# Patient Record
Sex: Female | Born: 1985 | Race: Black or African American | Hispanic: No | Marital: Single | Smoking: Current every day smoker
Health system: Southern US, Community
[De-identification: ages and names within clinical notes are randomized; demographics above are authoritative.]

## PROBLEM LIST (undated history)

## (undated) DIAGNOSIS — N83209 Unspecified ovarian cyst, unspecified side: Secondary | ICD-10-CM

## (undated) DIAGNOSIS — K648 Other hemorrhoids: Secondary | ICD-10-CM

## (undated) DIAGNOSIS — F419 Anxiety disorder, unspecified: Secondary | ICD-10-CM

## (undated) DIAGNOSIS — A599 Trichomoniasis, unspecified: Secondary | ICD-10-CM

## (undated) DIAGNOSIS — D649 Anemia, unspecified: Secondary | ICD-10-CM

## (undated) DIAGNOSIS — F329 Major depressive disorder, single episode, unspecified: Secondary | ICD-10-CM

## (undated) DIAGNOSIS — O00109 Unspecified tubal pregnancy without intrauterine pregnancy: Secondary | ICD-10-CM

## (undated) DIAGNOSIS — K589 Irritable bowel syndrome without diarrhea: Secondary | ICD-10-CM

## (undated) DIAGNOSIS — T7840XA Allergy, unspecified, initial encounter: Secondary | ICD-10-CM

## (undated) DIAGNOSIS — R112 Nausea with vomiting, unspecified: Secondary | ICD-10-CM

## (undated) DIAGNOSIS — Z9889 Other specified postprocedural states: Secondary | ICD-10-CM

## (undated) DIAGNOSIS — A048 Other specified bacterial intestinal infections: Secondary | ICD-10-CM

## (undated) DIAGNOSIS — K219 Gastro-esophageal reflux disease without esophagitis: Secondary | ICD-10-CM

## (undated) DIAGNOSIS — F32A Depression, unspecified: Secondary | ICD-10-CM

## (undated) HISTORY — DX: Other specified bacterial intestinal infections: A04.8

## (undated) HISTORY — DX: Trichomoniasis, unspecified: A59.9

## (undated) HISTORY — PX: TUBAL LIGATION: SHX77

## (undated) HISTORY — DX: Depression, unspecified: F32.A

## (undated) HISTORY — DX: Major depressive disorder, single episode, unspecified: F32.9

## (undated) HISTORY — DX: Allergy, unspecified, initial encounter: T78.40XA

## (undated) HISTORY — DX: Unspecified ovarian cyst, unspecified side: N83.209

## (undated) HISTORY — DX: Gastro-esophageal reflux disease without esophagitis: K21.9

## (undated) HISTORY — DX: Anemia, unspecified: D64.9

## (undated) HISTORY — DX: Irritable bowel syndrome, unspecified: K58.9

## (undated) HISTORY — DX: Other hemorrhoids: K64.8

## (undated) HISTORY — DX: Anxiety disorder, unspecified: F41.9

---

## 2008-05-27 ENCOUNTER — Inpatient Hospital Stay (HOSPITAL_COMMUNITY): Admission: AD | Admit: 2008-05-27 | Discharge: 2008-05-27 | Payer: Self-pay | Admitting: Gynecology

## 2008-06-04 ENCOUNTER — Ambulatory Visit: Payer: Self-pay | Admitting: Advanced Practice Midwife

## 2008-06-04 ENCOUNTER — Inpatient Hospital Stay (HOSPITAL_COMMUNITY): Admission: AD | Admit: 2008-06-04 | Discharge: 2008-06-04 | Payer: Self-pay | Admitting: Obstetrics & Gynecology

## 2008-06-05 ENCOUNTER — Ambulatory Visit: Payer: Self-pay | Admitting: Obstetrics & Gynecology

## 2008-06-10 ENCOUNTER — Ambulatory Visit (HOSPITAL_COMMUNITY): Admission: RE | Admit: 2008-06-10 | Discharge: 2008-06-10 | Payer: Self-pay | Admitting: Obstetrics and Gynecology

## 2008-06-12 ENCOUNTER — Ambulatory Visit: Payer: Self-pay | Admitting: Obstetrics & Gynecology

## 2008-06-26 ENCOUNTER — Ambulatory Visit: Payer: Self-pay | Admitting: Obstetrics & Gynecology

## 2008-07-03 ENCOUNTER — Ambulatory Visit: Payer: Self-pay | Admitting: Obstetrics & Gynecology

## 2008-07-05 ENCOUNTER — Inpatient Hospital Stay (HOSPITAL_COMMUNITY): Admission: AD | Admit: 2008-07-05 | Discharge: 2008-07-05 | Payer: Self-pay | Admitting: Obstetrics & Gynecology

## 2008-07-05 ENCOUNTER — Ambulatory Visit: Payer: Self-pay | Admitting: Physician Assistant

## 2008-07-06 ENCOUNTER — Inpatient Hospital Stay (HOSPITAL_COMMUNITY): Admission: AD | Admit: 2008-07-06 | Discharge: 2008-07-06 | Payer: Self-pay | Admitting: Obstetrics & Gynecology

## 2008-07-06 ENCOUNTER — Ambulatory Visit: Payer: Self-pay | Admitting: Advanced Practice Midwife

## 2008-07-10 ENCOUNTER — Ambulatory Visit: Payer: Self-pay | Admitting: Obstetrics & Gynecology

## 2008-07-11 ENCOUNTER — Ambulatory Visit: Payer: Self-pay | Admitting: Family Medicine

## 2008-07-11 ENCOUNTER — Inpatient Hospital Stay (HOSPITAL_COMMUNITY): Admission: AD | Admit: 2008-07-11 | Discharge: 2008-07-11 | Payer: Self-pay | Admitting: Family Medicine

## 2008-07-13 ENCOUNTER — Inpatient Hospital Stay (HOSPITAL_COMMUNITY): Admission: AD | Admit: 2008-07-13 | Discharge: 2008-07-15 | Payer: Self-pay | Admitting: Obstetrics & Gynecology

## 2008-07-13 ENCOUNTER — Ambulatory Visit: Payer: Self-pay | Admitting: Gynecology

## 2008-07-23 ENCOUNTER — Inpatient Hospital Stay (HOSPITAL_COMMUNITY): Admission: AD | Admit: 2008-07-23 | Discharge: 2008-07-25 | Payer: Self-pay | Admitting: Obstetrics & Gynecology

## 2008-07-23 ENCOUNTER — Ambulatory Visit: Payer: Self-pay | Admitting: Family Medicine

## 2011-03-02 NOTE — Discharge Summary (Signed)
NAME:  Jennifer Combs, Jennifer Combs             ACCOUNT NO.:  [id number]   MEDICAL RECORD NO.:  [id number]          PATIENT TYPE:  INP   LOCATION:  9317                          FACILITY:  [facility]   PHYSICIAN:  Lesly Dukes, M.D. DATE OF BIRTH:  10-29-1985   DATE OF ADMISSION:  07/23/2008  DATE OF DISCHARGE:  07/25/2008                               DISCHARGE SUMMARY   REASON FOR ADMISSION:  [name] is a 25 year old female who  presented to MAU 10 days postpartum with temperature of 103, diffuse  myalgias, and lower abdominal pain.  She reports fevers and chills and  moderate amount of lochia that was not foul smelling.  She denied having  cough, running nose, or sore throat.  Her abdomen was markedly tender in  the MAU and she was diagnosed with endometritis and admitted for IV  antibiotics.   HOSPITAL COURSE:  [name] was admitted, started on triple antibiotic  therapy with ampicillin, clindamycin, and gentamicin.  Her initial white  count was 9.1 with a neutrophil of 83%.  The patient had a temperature  of 101.1 at 9:00 p.m. on the day of admission, however, since then she  has remained and afebrile.  Her abdominal pain is markedly decreased at  time of discharge.  Of note, she did complaint some right breast pain  during her hospital stay.  However, there was no fluctuance or erythema  noted.  Additionally after pumping a large bottle, the breasts felt  significantly better.  At the time of discharge, the patient has minimal  abdominal tenderness.  Her temperature is normal and she is feeling  markedly better.  The patient will be discharged on Augmentin twice  daily for another 12 days to complete 14-day course of antibiotics.   FINAL DIAGNOSIS:  Endometritis.   CONDITION AT DISCHARGE:  Stable.   INSTRUCTIONS FOR THE PATIENT:  The patient was instructed to take  Augmentin 1 pill twice daily for 12 more days.  The patient was given no  restrictions for activity and diet.   The patient was instructed to  [facility] up with [facility] for her 6-week postpartum check or  return to the MAU for worsening symptoms before that.  However, the  patient's questions were answered and the patient was voice  understanding of the above instructions.      Odie Sera, DO      Lesly Dukes, M.D.  Electronically Signed    [facility]  D:  07/25/2008  T:  07/25/2008  Job:  [id number]

## 2011-04-08 DIAGNOSIS — Z8659 Personal history of other mental and behavioral disorders: Secondary | ICD-10-CM | POA: Insufficient documentation

## 2011-04-08 DIAGNOSIS — O09899 Supervision of other high risk pregnancies, unspecified trimester: Secondary | ICD-10-CM | POA: Insufficient documentation

## 2011-04-08 DIAGNOSIS — A6000 Herpesviral infection of urogenital system, unspecified: Secondary | ICD-10-CM | POA: Insufficient documentation

## 2011-04-08 DIAGNOSIS — O99891 Other specified diseases and conditions complicating pregnancy: Secondary | ICD-10-CM | POA: Insufficient documentation

## 2011-07-16 LAB — GC/CHLAMYDIA PROBE AMP, GENITAL
Chlamydia, DNA Probe: NEGATIVE
GC Probe Amp, Genital: NEGATIVE

## 2011-07-16 LAB — WET PREP, GENITAL
Clue Cells Wet Prep HPF POC: NONE SEEN
Trich, Wet Prep: NONE SEEN
Yeast Wet Prep HPF POC: NONE SEEN

## 2011-07-19 LAB — CBC
HCT: 26.8 — ABNORMAL LOW
HCT: 34 — ABNORMAL LOW
Hemoglobin: 11.3 — ABNORMAL LOW
Hemoglobin: 9.1 — ABNORMAL LOW
MCHC: 34.1
MCV: 99
Platelets: 268
RBC: 2.71 — ABNORMAL LOW
RBC: 3.44 — ABNORMAL LOW
RDW: 13.3
WBC: 9.5

## 2011-07-19 LAB — POCT URINALYSIS DIP (DEVICE)
Bilirubin Urine: NEGATIVE
Glucose, UA: NEGATIVE
Glucose, UA: NEGATIVE
Ketones, ur: NEGATIVE
Operator id: 200901
Operator id: 297281
Protein, ur: NEGATIVE
Specific Gravity, Urine: 1.02
Specific Gravity, Urine: <=1.005

## 2011-07-20 LAB — URINE CULTURE

## 2011-07-20 LAB — CULTURE, BLOOD (ROUTINE X 2): Culture: NO GROWTH

## 2011-07-20 LAB — DIFFERENTIAL
Basophils Absolute: 0
Lymphocytes Relative: 13
Lymphs Abs: 1.1
Neutro Abs: 7.6

## 2011-07-20 LAB — CBC
Platelets: 305
RDW: 13.2
WBC: 9.1

## 2011-07-20 LAB — URINALYSIS, ROUTINE W REFLEX MICROSCOPIC
Glucose, UA: NEGATIVE
Protein, ur: NEGATIVE
Specific Gravity, Urine: 1.01
Urobilinogen, UA: 1

## 2011-07-20 LAB — BASIC METABOLIC PANEL
CO2: 22
Chloride: 103
GFR calc Af Amer: >60
Potassium: 2.8 — ABNORMAL LOW
Sodium: 137

## 2011-07-20 LAB — URINE MICROSCOPIC-ADD ON

## 2011-07-21 LAB — POCT URINALYSIS DIP (DEVICE)
Bilirubin Urine: NEGATIVE
Glucose, UA: NEGATIVE
Nitrite: NEGATIVE
Operator id: 297281
pH: 6

## 2012-03-29 ENCOUNTER — Encounter (HOSPITAL_COMMUNITY): Payer: Self-pay

## 2012-07-06 DIAGNOSIS — Z3490 Encounter for supervision of normal pregnancy, unspecified, unspecified trimester: Secondary | ICD-10-CM | POA: Insufficient documentation

## 2012-07-06 DIAGNOSIS — O09899 Supervision of other high risk pregnancies, unspecified trimester: Secondary | ICD-10-CM | POA: Insufficient documentation

## 2012-07-24 DIAGNOSIS — Z3009 Encounter for other general counseling and advice on contraception: Secondary | ICD-10-CM | POA: Insufficient documentation

## 2012-10-02 DIAGNOSIS — O365990 Maternal care for other known or suspected poor fetal growth, unspecified trimester, not applicable or unspecified: Secondary | ICD-10-CM | POA: Insufficient documentation

## 2012-10-05 DIAGNOSIS — Z7185 Encounter for immunization safety counseling: Secondary | ICD-10-CM | POA: Insufficient documentation

## 2015-02-01 ENCOUNTER — Emergency Department (HOSPITAL_COMMUNITY)
Admission: EM | Admit: 2015-02-01 | Discharge: 2015-02-01 | Disposition: A | Discharge status: Home / Self Care | Payer: Medicaid Other | Attending: Emergency Medicine | Admitting: Emergency Medicine

## 2015-02-01 ENCOUNTER — Encounter (HOSPITAL_COMMUNITY): Payer: Self-pay

## 2015-02-01 DIAGNOSIS — R110 Nausea: Secondary | ICD-10-CM | POA: Diagnosis present

## 2015-02-01 DIAGNOSIS — Z720 Tobacco use: Secondary | ICD-10-CM | POA: Diagnosis not present

## 2015-02-01 HISTORY — DX: Unspecified tubal pregnancy without intrauterine pregnancy: O00.109

## 2015-02-01 LAB — I-STAT BETA HCG BLOOD, ED (MC, WL, AP ONLY)

## 2015-02-01 MED ORDER — ONDANSETRON 4 MG PO TBDP
8.0000 mg | ORAL_TABLET | Freq: Once | ORAL | Status: AC
Start: 2015-02-01 — End: 2015-02-01
  Administered 2015-02-01: 8 mg via ORAL
  Filled 2015-02-01: qty 2

## 2015-02-01 MED ORDER — ONDANSETRON 8 MG PO TBDP: 8.0000 mg | ORAL_TABLET | Freq: Three times a day (TID) | ORAL | Status: DC | PRN

## 2015-02-01 NOTE — ED Provider Notes (Signed)
CSN: [id number]     Arrival date & time 02/01/15  0740 History   First MD Initiated Contact with Patient 02/01/15 0745     Chief Complaint  Patient presents with  . Nausea     (Consider location/radiation/quality/duration/timing/severity/associated sxs/prior Treatment) HPI 29 year old female G5 P4 A1 presents today with last menstrual period at the beginning of last month and nausea. She states that her phone has had problems and she is not sure exactly what her menstrual period was. She states that it was at the beginning of last month. She is not sure if her menses is late. She's been pregnant multiple times in the past. She has had nausea for the past 2 weeks in the morning. She had some abdominal cramping yesterday but is not having any current abdominal pain. She denies any vaginal discharge. She has been eating and drinking and has had some episode of dry heaving 2 during the past 2 weeks. She has not lost weight. She took Zofran at home 2 with some relief. She denies any weight loss, decreased urine output, increased frequency of urination, or diarrhea or fever. Past Medical History  Diagnosis Date  . Ectopic pregnancy, tubal    No past surgical history on file. No family history on file. History  Substance Use Topics  . Smoking status: Light Tobacco Smoker  . Smokeless tobacco: Not on file  . Alcohol Use: Yes   OB History    No data available     Review of Systems  All other systems reviewed and are negative.     Allergies  Citrus  Home Medications   Prior to Admission medications   Medication Sig Start Date End Date Taking? Authorizing Provider  ondansetron (ZOFRAN-ODT) 4 MG disintegrating tablet Take 4 mg by mouth every 8 (eight) hours as needed for nausea or vomiting.   Yes Historical Provider, MD   BP 114/62 mmHg  Pulse 68  Temp(Src) 98.5 F (36.9 C) (Oral)  Resp 18  Ht  (1.499 m)  Wt 145 lb (65.772 kg)  BMI 29.27 kg/m2  SpO2 100% Physical Exam   Constitutional: She is oriented to person, place, and time. She appears well-developed and well-nourished.  HENT:  Head: Normocephalic and atraumatic.  Right Ear: External ear normal.  Left Ear: External ear normal.  Nose: Nose normal.  Mouth/Throat: Oropharynx is clear and moist.  Eyes: Conjunctivae and EOM are normal. Pupils are equal, round, and reactive to light.  Neck: Normal range of motion.  Cardiovascular: Normal rate.   Pulmonary/Chest: Effort normal and breath sounds normal.  Abdominal: Soft. Bowel sounds are normal. She exhibits no distension and no mass. There is no tenderness. There is no rebound and no guarding.  Musculoskeletal: Normal range of motion.  Neurological: She is alert and oriented to person, place, and time.  Skin: Skin is warm and dry.  Psychiatric: She has a normal mood and affect.  Nursing note and vitals reviewed.   ED Course  Procedures (including critical care time) Labs Review Labs Reviewed  I-STAT BETA HCG BLOOD, ED ([facility], [facility], [facility] ONLY)    Imaging Review No results found.   EKG Interpretation None      MDM   Final diagnoses:  Nausea    Patient here with nausea and concern for pregnancy. I-STAT quantitative point of care. HCG is negative for protein. She is given Zofran and has not vomited here in department. She's given a prescription for Zofran and advised regarding follow-up and return precautions  and voices understanding.    Margarita Grizzleanielle Maye Parkinson, MD 02/01/15 [id number]

## 2015-02-01 NOTE — ED Notes (Signed)
Pt given a warm blanket 

## 2015-02-01 NOTE — ED Notes (Signed)
Pt here for nausea x 2 weeks, no relief with zofran, sts similar issues with pregnancy and unsure if pregnant.

## 2015-02-01 NOTE — Discharge Instructions (Signed)

## 2015-02-05 ENCOUNTER — Emergency Department (HOSPITAL_COMMUNITY)
Admission: EM | Admit: 2015-02-05 | Discharge: 2015-02-05 | Disposition: A | Discharge status: Home / Self Care | Payer: Medicaid Other | Attending: Emergency Medicine | Admitting: Emergency Medicine

## 2015-02-05 ENCOUNTER — Encounter (HOSPITAL_COMMUNITY): Payer: Self-pay

## 2015-02-05 DIAGNOSIS — Z3202 Encounter for pregnancy test, result negative: Secondary | ICD-10-CM | POA: Insufficient documentation

## 2015-02-05 DIAGNOSIS — R197 Diarrhea, unspecified: Secondary | ICD-10-CM | POA: Insufficient documentation

## 2015-02-05 DIAGNOSIS — K30 Functional dyspepsia: Secondary | ICD-10-CM

## 2015-02-05 DIAGNOSIS — R112 Nausea with vomiting, unspecified: Secondary | ICD-10-CM | POA: Insufficient documentation

## 2015-02-05 DIAGNOSIS — Z9851 Tubal ligation status: Secondary | ICD-10-CM | POA: Insufficient documentation

## 2015-02-05 DIAGNOSIS — R1084 Generalized abdominal pain: Secondary | ICD-10-CM | POA: Diagnosis not present

## 2015-02-05 DIAGNOSIS — R109 Unspecified abdominal pain: Secondary | ICD-10-CM | POA: Diagnosis present

## 2015-02-05 DIAGNOSIS — Z720 Tobacco use: Secondary | ICD-10-CM | POA: Diagnosis not present

## 2015-02-05 LAB — URINALYSIS, ROUTINE W REFLEX MICROSCOPIC
BILIRUBIN URINE: NEGATIVE
GLUCOSE, UA: NEGATIVE mg/dL
Ketones, ur: NEGATIVE mg/dL
Leukocytes, UA: NEGATIVE
Nitrite: NEGATIVE
PROTEIN: NEGATIVE mg/dL
SPECIFIC GRAVITY, URINE: 1.017 (ref 1.005–1.030)
UROBILINOGEN UA: 1 mg/dL (ref 0.0–1.0)
pH: 8 (ref 5.0–8.0)

## 2015-02-05 LAB — COMPREHENSIVE METABOLIC PANEL
ALBUMIN: 3.7 g/dL (ref 3.5–5.2)
ALK PHOS: 72 U/L (ref 39–117)
ALT: 10 U/L (ref 0–35)
ANION GAP: 6 (ref 5–15)
AST: 19 U/L (ref 0–37)
BILIRUBIN TOTAL: 0.4 mg/dL (ref 0.3–1.2)
BUN: 11 mg/dL (ref 6–23)
CALCIUM: 8.9 mg/dL (ref 8.4–10.5)
CHLORIDE: 106 mmol/L (ref 96–112)
CO2: 27 mmol/L (ref 19–32)
CREATININE: 0.75 mg/dL (ref 0.50–1.10)
GFR calc non Af Amer: >90 mL/min (ref >90)
Glucose, Bld: 89 mg/dL (ref 70–99)
Potassium: 3.8 mmol/L (ref 3.5–5.1)
Sodium: 139 mmol/L (ref 135–145)
Total Protein: 6.6 g/dL (ref 6.0–8.3)

## 2015-02-05 LAB — CBC WITH DIFFERENTIAL/PLATELET
Basophils Absolute: 0 10*3/uL (ref 0.0–0.1)
Basophils Relative: 0 % (ref 0–1)
EOS ABS: 0.1 10*3/uL (ref 0.0–0.7)
Eosinophils Relative: 2 % (ref 0–5)
HCT: 30.3 % — ABNORMAL LOW (ref 36.0–46.0)
Hemoglobin: 10.3 g/dL — ABNORMAL LOW (ref 12.0–15.0)
LYMPHS ABS: 1.6 10*3/uL (ref 0.7–4.0)
Lymphocytes Relative: 34 % (ref 12–46)
MCH: 32.7 pg (ref 26.0–34.0)
MCHC: 34 g/dL (ref 30.0–36.0)
MCV: 96.2 fL (ref 78.0–100.0)
Monocytes Absolute: 0.2 10*3/uL (ref 0.1–1.0)
Monocytes Relative: 5 % (ref 3–12)
NEUTROS PCT: 59 % (ref 43–77)
Neutro Abs: 2.7 10*3/uL (ref 1.7–7.7)
PLATELETS: 255 10*3/uL (ref 150–400)
RBC: 3.15 MIL/uL — AB (ref 3.87–5.11)
RDW: 12.4 % (ref 11.5–15.5)
WBC: 4.6 10*3/uL (ref 4.0–10.5)

## 2015-02-05 LAB — URINE MICROSCOPIC-ADD ON

## 2015-02-05 LAB — POC URINE PREG, ED: Preg Test, Ur: NEGATIVE

## 2015-02-05 LAB — LIPASE, BLOOD: LIPASE: 24 U/L (ref 11–59)

## 2015-02-05 LAB — PREGNANCY, URINE: PREG TEST UR: NEGATIVE

## 2015-02-05 MED ORDER — PANTOPRAZOLE SODIUM 20 MG PO TBEC: 20.0000 mg | DELAYED_RELEASE_TABLET | Freq: Every day | ORAL | Status: DC

## 2015-02-05 MED ORDER — GI COCKTAIL ~~LOC~~
30.0000 mL | Freq: Once | ORAL | Status: AC
  Administered 2015-02-05: 30 mL via ORAL
  Filled 2015-02-05: qty 30

## 2015-02-05 NOTE — ED Provider Notes (Signed)
CSN: [id number]     Arrival date & time 02/05/15  1231 History  This chart is scribed for non-physician practitioner, Dierdre Forth, PA-C, working with Doug Sou, MD by Abel Presto, ED Scribe.  This patient was seen in room TR01C/TR01C and the patient's care was started 2:02 PM.      Chief Complaint  Patient presents with  . Abdominal Pain      HPI HPI Comments: Jennifer Combs is a 29 y.o. female who presents to the Emergency Department complaining of intermittent generalized burning abdominal pain with first onset 2 years ago worsening in last 4 days. Pt states abdominal pain started after tubal ligation 2 years ago. Pt reports she has changed her diet to minimized frequency of episodes. Pt likens pain to sensation felt after eating spicy foods, but reports she has not been eating spicy foods.She also states it feels as though something is moving in her abdomen however she is not pregnant. (Patient was evaluated on 02/01/2015 with a negative blood hCG and just finished her menses.) Pt notes associated diarrhea, nausea, and vomiting once with onset today. Pt states she has defecated 5 times today. She reports these symptoms are typical for episodes of her abdominal pain.  She denies melena, hematochezia, ileus or bloody emesis.  Pt has not had any EtOH in a week. She denies NSAID usage. Pt's LNMP just ended. Pt denies dysuria, hematuria, frequency, urgency, fever, chills, hematochezia, and hematemesis.   Past Medical History  Diagnosis Date  . Ectopic pregnancy, tubal    History reviewed. No pertinent past surgical history. History reviewed. No pertinent family history. History  Substance Use Topics  . Smoking status: Light Tobacco Smoker  . Smokeless tobacco: Not on file  . Alcohol Use: Yes   OB History    No data available     Review of Systems  Constitutional: Negative for fever, chills, diaphoresis, appetite change, fatigue and unexpected weight change.  HENT:  Negative for mouth sores.   Eyes: Negative for visual disturbance.  Respiratory: Negative for cough, chest tightness, shortness of breath and wheezing.   Cardiovascular: Negative for chest pain.  Gastrointestinal: Positive for nausea, vomiting, abdominal pain and diarrhea. Negative for constipation and blood in stool.  Endocrine: Negative for polydipsia, polyphagia and polyuria.  Genitourinary: Negative for dysuria, urgency, frequency and hematuria.  Musculoskeletal: Negative for back pain and neck stiffness.  Skin: Negative for rash.  Allergic/Immunologic: Negative for immunocompromised state.  Neurological: Negative for syncope, light-headedness and headaches.  Hematological: Does not bruise/bleed easily.  Psychiatric/Behavioral: Negative for sleep disturbance. The patient is not nervous/anxious.       Allergies  Citrus  Home Medications   Prior to Admission medications   Medication Sig Start Date End Date Taking? Authorizing Provider  ondansetron (ZOFRAN ODT) 8 MG disintegrating tablet Take 1 tablet (8 mg total) by mouth every 8 (eight) hours as needed for nausea or vomiting. 02/01/15   Margarita Grizzle, MD  pantoprazole (PROTONIX) 20 MG tablet Take 1 tablet (20 mg total) by mouth daily. 02/05/15   Crystalee Ventress, PA-C   BP 112/79 mmHg  Pulse 67  Temp(Src) 98.9 F (37.2 C) (Oral)  Resp 18  Ht  (1.499 m)  Wt 155 lb (70.308 kg)  BMI 31.29 kg/m2  SpO2 100%  LMP 02/05/2015 Physical Exam  Constitutional: She appears well-developed and well-nourished. No distress.  Awake, alert, nontoxic appearance  HENT:  Head: Normocephalic and atraumatic.  Mouth/Throat: Oropharynx is clear and moist. No oropharyngeal exudate.  Eyes: Conjunctivae are normal. No scleral icterus.  Neck: Normal range of motion. Neck supple.  Cardiovascular: Normal rate, regular rhythm, normal heart sounds and intact distal pulses.   No murmur heard. Pulmonary/Chest: Effort normal and breath sounds  normal. No respiratory distress. She has no wheezes.  Equal chest expansion  Abdominal: Soft. Bowel sounds are normal. She exhibits no distension and no mass. There is no tenderness. There is no rebound and no guarding.  Musculoskeletal: Normal range of motion. She exhibits no edema.  Neurological: She is alert.  Speech is clear and goal oriented Moves extremities without ataxia  Skin: Skin is warm and dry. She is not diaphoretic. No erythema.  Psychiatric: She has a normal mood and affect.  Nursing note and vitals reviewed.   ED Course  Procedures (including critical care time) DIAGNOSTIC STUDIES: Oxygen Saturation is 100% on room air, normal by my interpretation.    COORDINATION OF CARE: 2:11 PM Discussed treatment plan with patient at beside, the patient agrees with the plan and has no further questions at this time.   Labs Review Labs Reviewed  CBC WITH DIFFERENTIAL/PLATELET - Abnormal; Notable for the following:    RBC 3.15 (*)    Hemoglobin 10.3 (*)    HCT 30.3 (*)    All other components within normal limits  COMPREHENSIVE METABOLIC PANEL  LIPASE, BLOOD  PREGNANCY, URINE  URINALYSIS, ROUTINE W REFLEX MICROSCOPIC  POC URINE PREG, ED    Imaging Review No results found.   EKG Interpretation None     Results for orders placed or performed during the hospital encounter of 02/05/15  CBC WITH DIFFERENTIAL  Result Value Ref Range   WBC 4.6 4.0 - 10.5 K/uL   RBC 3.15 (L) 3.87 - 5.11 MIL/uL   Hemoglobin 10.3 (L) 12.0 - 15.0 g/dL   HCT 16.130.3 (L) 09.636.0 - 04.546.0 %   MCV 96.2 78.0 - 100.0 fL   MCH 32.7 26.0 - 34.0 pg   MCHC 34.0 30.0 - 36.0 g/dL   RDW 40.912.4 81.111.5 - 91.415.5 %   Platelets 255 150 - 400 K/uL   Neutrophils Relative % 59 43 - 77 %   Neutro Abs 2.7 1.7 - 7.7 K/uL   Lymphocytes Relative 34 12 - 46 %   Lymphs Abs 1.6 0.7 - 4.0 K/uL   Monocytes Relative 5 3 - 12 %   Monocytes Absolute 0.2 0.1 - 1.0 K/uL   Eosinophils Relative 2 0 - 5 %   Eosinophils Absolute 0.1  0.0 - 0.7 K/uL   Basophils Relative 0 0 - 1 %   Basophils Absolute 0.0 0.0 - 0.1 K/uL  Comprehensive metabolic panel  Result Value Ref Range   Sodium 139 135 - 145 mmol/L   Potassium 3.8 3.5 - 5.1 mmol/L   Chloride 106 96 - 112 mmol/L   CO2 27 19 - 32 mmol/L   Glucose, Bld 89 70 - 99 mg/dL   BUN 11 6 - 23 mg/dL   Creatinine, Ser 7.820.75 0.50 - 1.10 mg/dL   Calcium 8.9 8.4 - 95.610.5 mg/dL   Total Protein 6.6 6.0 - 8.3 g/dL   Albumin 3.7 3.5 - 5.2 g/dL   AST 19 0 - 37 U/L   ALT 10 0 - 35 U/L   Alkaline Phosphatase 72 39 - 117 U/L   Total Bilirubin 0.4 0.3 - 1.2 mg/dL   GFR calc non Af Amer >90 >90 mL/min   GFR calc Af Amer >90 >90 mL/min   Anion  gap 6 5 - 15  Lipase, blood  Result Value Ref Range   Lipase 24 11 - 59 U/L  Pregnancy, urine  Result Value Ref Range   Preg Test, Ur NEGATIVE NEGATIVE  POC urine preg, ED (not at [facility])  Result Value Ref Range   Preg Test, Ur NEGATIVE NEGATIVE   No results found.   MDM   Final diagnoses:  Abdominal pain, generalized  Burning sensation of stomach   Lesha R Dant presents with acute exacerbation of her chronic burning generalized abdominal pain present for 2 years. Patient reports history of CT which she reports was negative; unable to find this in patient's record. Patient reports taking omeprazole for several months without relief.  On exam her abdomen is soft and nontender. Basic labs obtained today are reassuring. Patient is without urinary or vaginal symptoms. Patient with negative hCG 4 days ago.  UA and pregnancy test negative today. Patient given GI cocktail here in the emergency department. Omeprazole will be switched to Protonix. Patient is to follow-up with gastroenterology and OB/GYN within the next week for further evaluation of her symptoms. Recommend avoidance of alcohol and NSAIDs.  No vomiting here in the emergency department.  Strict return precautions given.    I have personally reviewed patient's vitals, nursing note and  any pertinent labs or imaging.  I performed an undressed physical exam.    It has been determined that no acute conditions requiring further emergency intervention are present at this time. The patient/guardian have been advised of the diagnosis and plan. I reviewed all labs and imaging including any potential incidental findings. We have discussed signs and symptoms that warrant return to the ED and they are listed in the discharge instructions.    Vital signs are stable at discharge.   BP 112/79 mmHg  Pulse 67  Temp(Src) 98.9 F (37.2 C) (Oral)  Resp 18  Ht  (1.499 m)  Wt 155 lb (70.308 kg)  BMI 31.29 kg/m2  SpO2 100%  LMP 02/05/2015  I personally performed the services described in this documentation, which was scribed in my presence. The recorded information has been reviewed and is accurate.   Dierdre Forth, PA-C 02/05/15 1431  Bobbie Virden, PA-C 02/05/15 1455  Doug Sou, MD 02/05/15 [phone]

## 2015-02-05 NOTE — ED Notes (Signed)
Pt presents with continued pain to abdomen.  Pt was seen here for same a few days ago, reports pain is to umbilicus and is now generalized, reports 1 episode of vomiting and diarrhea today.

## 2015-02-05 NOTE — ED Notes (Signed)
Pt c/o generalized abdominal "burning" pain that has been on and off for 2 years. Has had a CT in the past which apparently was negative. Was put on Omeprizole by PCP without improvement.

## 2015-02-05 NOTE — Discharge Instructions (Signed)
1. Medications: protonix, usual home medications 2. Treatment: rest, drink plenty of fluids, advance diet slowly 3. Follow Up: Please followup with your primary doctor in 2 days for discussion of your diagnoses and further evaluation after today's visit; if you do not have a primary care doctor use the resource guide provided to find one; Please return to the ER for persistent vomiting, high fevers or worsening symptoms   Emergency Department Resource Guide 1) Find a Doctor and Pay Out of Pocket Although you won't have to find out who is covered by your insurance plan, it is a good idea to ask around and get recommendations. You will then need to call the office and see if the doctor you have chosen will accept you as a new patient and what types of options they offer for patients who are self-pay. Some doctors offer discounts or will set up payment plans for their patients who do not have insurance, but you will need to ask so you aren't surprised when you get to your appointment.  2) Contact Your Local Health Department Not all health departments have doctors that can see patients for sick visits, but many do, so it is worth a call to see if yours does. If you don't know where your local health department is, you can check in your phone book. The CDC also has a tool to help you locate your state's health department, and many state websites also have listings of all of their local health departments.  3) Find a Walk-in Clinic If your illness is not likely to be very severe or complicated, you may want to try a walk in clinic. These are popping up all over the country in pharmacies, drugstores, and shopping centers. They're usually staffed by nurse practitioners or physician assistants that have been trained to treat common illnesses and complaints. They're usually fairly quick and inexpensive. However, if you have serious medical issues or chronic medical problems, these are probably not your best  option.  No Primary Care Doctor: - Call Health Connect at  [phone] - they can help you locate a primary care doctor that  accepts your insurance, provides certain services, etc. - Physician Referral Service- [phone]  Chronic Pain Problems: Organization         Address  Phone   Notes  [facility]  [phone] Patients need to be referred by their primary care doctor.   Medication Assistance: Organization         Address  Phone   Notes  [facility] [address] [place], [place] [address] [phone] --Must be a resident of [facility] [place] -- Must have NO insurance coverage whatsoever (no Medicaid/ Medicare, etc.) -- The pt. MUST have a primary care doctor that directs their care regularly and follows them in the community   [facility]ist  [phone]   [employer]  [phone]    Agencies that provide inexpensive medical care: Organization         Address  Phone   Notes  [facility]  [phone]   [facility]    [phone]   [facility] [address] [place], [place] [address] [phone]   [facility] [address], [place] [phone]) [phone]   [facility]anned [facility]    [phone]) [phone]   [facility]    [phone]) [phone]   [facility] and [facility]  [address], [place] Phone:  [phone], Fax:  [phone] Hours of Operation:  9 am - 6 pm, M-F.  Also accepts Medicaid/Medicare and self-pay.  [facility] [place], [address], [place] Phone: [phone], Fax: [phone]. Hours of Operation:  8:30 am - 5:30 pm, M-F.  Also accepts Medicaid and self-pay.  [facility] [place] [address], [place] Phone: [phone]   [place], [place], [place] [phone], Ext. [phone] Mondays & Thursdays: 7-9 AM.  First 15  patients are seen on a first come, first serve basis.    [place] Providers:  Organization         Address  Phone   Notes  [facility] [address], [place] [phone] Also accepts self-pay patients.  [facility] [address], [place]  [phone]   [place], [address], [place] [phone]   [facility] [address] [place] [phone]   Lucianne Lei [address], [place]   [phone] Only accepts [place] Access [place] patients after they have their name applied to their card.   Self-Pay (no insurance) in [facility]:  Organization         Address  Phone   Notes  Sickle Cell Patients, [facility] [place] [phone]   [facility] [place] [phone]   [facility] [place]  [place], [place], [place] [phone]   [facility]/Dr. Osei-Bonsu  [address], [place] [address], [place] [phone] Phone number for both [place] and [place] locations is the same.  Urgent Medical and [facility] [address] [place] [phone]   [facility] [address], [place] or [address] [phone]   [facility] [address] [place] [phone], phone; [phone], fax Sees patients 1st and 3rd Saturday of every month.  Must not qualify for public or private insurance (i.e. Medicaid, Medicare, [place] Health Choice, Veterans' Benefits)  Household income should be no more than 200% of the poverty level The clinic cannot treat you if you are pregnant or think you are pregnant  Sexually transmitted diseases are not treated at the clinic.    Dental  Care: Organization         Address  Phone  Notes  [facility] Department of [facility] [place] [phone] Accepts children up to age 61 who are enrolled in [place] or [employer] [place]; pregnant women with a Medicaid card; and children who have applied for Medicaid or [place] Health Choice, but were declined, whose parents can pay a reduced fee at time of service.  [facility] of [facility]  [address], [place] [phone] Accepts children up to age 47 who are enrolled in [place] or [place]; pregnant women with a Medicaid card; and children who have applied for Medicaid or [place] Health Choice, but were declined, whose parents can pay a reduced fee at time of service.  [facility] Adult Dental Access PROGRAM  [place], [place] [phone]  Patients are seen by appointment only. Walk-ins are not accepted. [place] will see patients 1 years of age and older. Monday - Tuesday (8am-5pm) Most Wednesdays (8:30-5pm) $30 per visit, cash only  [facility] Adult Dental Access PROGRAM  [address], [facility] [phone] Patients are seen by appointment only. Walk-ins are not accepted. [place] will see patients 28 years of age and older. One Wednesday Evening (Monthly: Volunteer Based).  $30 per visit, cash only  [place]  [phone] for adults; Children under age 81, call Graduate Pediatric Dentistry at [phone]. Children aged 82-14, please call [phone] to request a pediatric application.  Dental services are provided in all areas of dental care including fillings, crowns and bridges, complete and partial dentures, implants, gum treatment, root canals, and extractions. Preventive care is also provided. Treatment is provided to both adults and children. Patients are selected via a lottery and there is often a waiting list.   [facility] [address] [place]  [phone] [email or web address]   [facility] [address] [place], [place] [phone], Ext. [phone] Second and Fourth Thursday of each month, opens at 6:30 AM; Clinic ends at 9 AM.  Patients are seen on a first-come first-served basis, and a limited number are seen during each clinic.   [facility]  [address] Hillard Danker [place], [place] [phone]   Eligibility Requirements You must have lived in [place], [place], or [place] counties for at least the last three months.   You cannot be eligible for state or federal sponsored [employer], including [employer], [place], or [employer].   You generally cannot be eligible for healthcare insurance through your employer.    How to apply: Eligibility screenings are held every Tuesday and Wednesday afternoon from 1:00 pm until 4:00 pm. You do not need an appointment for the interview!  [facility] [address] [place], [place]   [place]  [place] Department  [place]  [phone]    Behavioral Health Resources in the Community: Intensive Outpatient Programs Organization         Address  Phone  Notes  [place]. [address], [place], [place] [phone]   [facility] Outpatient [address] [place], [place]   ADS: Alcohol & Drug Svcs [address], [place], [place]   [place] [address],  [place], [place] or [phone]   Substance Abuse Resources Organization         Address  Phone  Notes  Alcohol and Drug Services  [phone]   [place]  [phone]   The [place]   Chinita Pester  [phone]   Residential & Outpatient Substance Abuse Program  [phone]    Psychological Services Organization         Address  Phone  Notes  [facility] [place]  [place]  [phone]   [place] [address], [place] or [phone]    Mobile Crisis Teams Organization         Address  Phone  Notes  [facility], Mobile Crisis Care Unit  [phone]   [facility]  [address] [employer], [place]   [facility] [address] [place]  [address] [place] [phone]    Self-Help/Support Groups Organization         Address  Phone             Notes  [facility] [place] - variety of support groups  [phone] Call for more information  Narcotics Anonymous (NA), Caring [facility] [address], [employer] [place]  2 meetings at this location   Statistician         Address  Phone  Notes  [facility] [address],    [place]  [phone]   [facility]  [address] [place] [address], [place], [place] [phone]   [facility] [address] [place], [place] [phone] Admissions: 8am-3pm M-F  [facility] [address]    [place], [place] [phone]   [facility] [address] [place], [place], [place] [phone]   [facility] [address]  [place], [place] [phone]   [facility] - Intensive Outpatient [address], [place], [place] [phone]   [facility] (Addiction Recovery [facility] [address]  [place], [place] [phone] or [phone]   [facility] (RTS) [address] [place], [place] [phone] Accepts Medicaid  [facility] [place] [address],  [place] [phone] Substance Abuse/Addiction Treatment   [facility] Organization         Address  Phone  Notes  [facility]  [phone]   Angie Fava, PhD [address] Ervin Knack [place], [place]   [phone] or [phone]   [facility]al   [address] [place], [place] [phone]   [facility] [address] [place], [place] [phone] Insurance/Medicaid/sponsorship through [facility] and Families [address]                                    [place], [place] [phone] Therapy/tele-psych/case  [facility] [address], [place] [phone]    Dr. Lolly Mustache  [phone]   [facility]  [employer] [facility] Dept. 1) [address], [place] 2) [address], [place] 3)  [address], [place] [phone]  [phone]   [facility] : [facility] Child Abuse Hotline [phone] or [phone] (After Hours)

## 2015-03-07 ENCOUNTER — Encounter (HOSPITAL_COMMUNITY): Payer: Self-pay | Admitting: Neurology

## 2015-03-07 ENCOUNTER — Emergency Department (HOSPITAL_COMMUNITY)
Admission: EM | Admit: 2015-03-07 | Discharge: 2015-03-07 | Disposition: A | Discharge status: Home / Self Care | Payer: Medicaid Other | Attending: Emergency Medicine | Admitting: Emergency Medicine

## 2015-03-07 DIAGNOSIS — Z720 Tobacco use: Secondary | ICD-10-CM | POA: Insufficient documentation

## 2015-03-07 DIAGNOSIS — Z79899 Other long term (current) drug therapy: Secondary | ICD-10-CM | POA: Insufficient documentation

## 2015-03-07 DIAGNOSIS — R05 Cough: Secondary | ICD-10-CM | POA: Diagnosis present

## 2015-03-07 DIAGNOSIS — J069 Acute upper respiratory infection, unspecified: Secondary | ICD-10-CM | POA: Insufficient documentation

## 2015-03-07 MED ORDER — ALBUTEROL SULFATE HFA 108 (90 BASE) MCG/ACT IN AERS: 1.0000 | INHALATION_SPRAY | Freq: Four times a day (QID) | RESPIRATORY_TRACT | Status: DC | PRN

## 2015-03-07 MED ORDER — IPRATROPIUM-ALBUTEROL 0.5-2.5 (3) MG/3ML IN SOLN
3.0000 mL | Freq: Once | RESPIRATORY_TRACT | Status: AC
  Administered 2015-03-07: 3 mL via RESPIRATORY_TRACT
  Filled 2015-03-07: qty 3

## 2015-03-07 MED ORDER — IBUPROFEN 800 MG PO TABS
800.0000 mg | ORAL_TABLET | Freq: Once | ORAL | Status: AC
  Administered 2015-03-07: 800 mg via ORAL
  Filled 2015-03-07: qty 1

## 2015-03-07 MED ORDER — IBUPROFEN 800 MG PO TABS
800.0000 mg | ORAL_TABLET | Freq: Three times a day (TID) | ORAL | Status: DC | PRN
Start: 2015-03-07 — End: 2016-07-02

## 2015-03-07 MED ORDER — BENZONATATE 100 MG PO CAPS: 100.0000 mg | ORAL_CAPSULE | Freq: Three times a day (TID) | ORAL | Status: DC | PRN

## 2015-03-07 NOTE — ED Notes (Signed)
Pt reports congested cough, with productive sputum since yesterday. Pain to chest when coughing. Also pain to entire back and feels tingling in feet. Feels hot all over body.

## 2015-03-07 NOTE — ED Provider Notes (Signed)
CSN: [id number]     Arrival date & time 03/07/15  0813 History   First MD Initiated Contact with Patient 03/07/15 0820     Chief Complaint  Patient presents with  . Cough  . Chest Pain     (Consider location/radiation/quality/duration/timing/severity/associated sxs/prior Treatment) The history is provided by the patient.     Patient presents with cough, chest tightness, headache, myalgias that began last night.  Cough is productive of yellow/green sputum.  Has had one episode of post tussive emesis.  Her children are sick at home with the same symptoms.  She has taken no medications for her symptoms.  Denies nasal congestion, rhinorrhea, sore throat, abdominal pain.  Denies recent immobilization, exogenous estrogen, leg swelling, history of blood clots.    Past Medical History  Diagnosis Date  . Ectopic pregnancy, tubal    History reviewed. No pertinent past surgical history. No family history on file. History  Substance Use Topics  . Smoking status: Current Every Day Smoker  . Smokeless tobacco: Not on file  . Alcohol Use: No   OB History    No data available     Review of Systems  All other systems reviewed and are negative.     Allergies  Citrus  Home Medications   Prior to Admission medications   Medication Sig Start Date End Date Taking? Authorizing Provider  ondansetron (ZOFRAN ODT) 8 MG disintegrating tablet Take 1 tablet (8 mg total) by mouth every 8 (eight) hours as needed for nausea or vomiting. 02/01/15   Margarita Grizzleanielle Ray, MD  pantoprazole (PROTONIX) 20 MG tablet Take 1 tablet (20 mg total) by mouth daily. 02/05/15   Hannah Muthersbaugh, PA-C   BP 105/68 mmHg  Pulse 76  Temp(Src) 98.5 F (36.9 C) (Oral)  Resp 15  SpO2 98%  LMP 02/01/2015 Physical Exam  Constitutional: She appears well-developed and well-nourished. No distress.  HENT:  Head: Normocephalic and atraumatic.  Neck: Neck supple.  Cardiovascular: Normal rate and regular rhythm.    Pulmonary/Chest: Effort normal and breath sounds normal. No respiratory distress. She has no wheezes. She has no rales.  Abdominal: Soft. She exhibits no distension. There is no tenderness. There is no rebound and no guarding.  Neurological: She is alert.  Skin: She is not diaphoretic.  Nursing note and vitals reviewed.   ED Course  Procedures (including critical care time) Labs Review Labs Reviewed - No data to display  Imaging Review No results found.   EKG Interpretation   Date/Time:  Friday Mar 07 2015 08:19:06 EDT Ventricular Rate:  83 PR Interval:  136 QRS Duration: 68 QT Interval:  351 QTC Calculation: 412 R Axis:   74 Text Interpretation:  Sinus rhythm Baseline wander in lead(s) I II III aVR  aVL aVF V1 V2 V3 V4 V5 V6 No prior for comparison Confirmed by Gwendolyn GrantWALDEN  MD,  BLAIR ([id number]) on 03/07/2015 8:22:31 AM      MDM   Final diagnoses:  URI (upper respiratory infection)    Afebrile, nontoxic patient with constellation of symptoms suggestive of viral syndrome.  No concerning findings on exam.  No fevers, Lungs CTAB, O2 100% on room air, no increased work of breathing.  Doubt pneumonia.  Discharged home with supportive care, PCP follow up.  Discussed result, findings, treatment, and follow up  with patient.  Pt given return precautions.  Pt verbalizes understanding and agrees with plan.       Trixie Dredgemily Senta Kantor, PA-C 03/07/15 1457  Elwin MochaBlair Walden, MD 03/07/15 [phone]

## 2015-03-07 NOTE — Discharge Instructions (Signed)
Read the information below.  Use the prescribed medication as directed.  Please discuss all new medications with your pharmacist.  You may return to the Emergency Department at any time for worsening condition or any new symptoms that concern you.  If you develop high fevers that do not resolve with tylenol or ibuprofen, you have difficulty swallowing or breathing, or you are unable to tolerate fluids by mouth, return to the ER for a recheck.   ° ° °Upper Respiratory Infection, Adult °An upper respiratory infection (URI) is also known as the common cold. It is often caused by a type of germ (virus). Colds are easily spread (contagious). You can pass it to others by kissing, coughing, sneezing, or drinking out of the same glass. Usually, you get better in 1 or 2 weeks.  °HOME CARE  °· Only take medicine as told by your doctor. °· Use a warm mist humidifier or breathe in steam from a hot shower. °· Drink enough water and fluids to keep your pee (urine) clear or pale yellow. °· Get plenty of rest. °· Return to work when your temperature is back to normal or as told by your doctor. You may use a face mask and wash your hands to stop your cold from spreading. °GET HELP RIGHT AWAY IF:  °· After the first few days, you feel you are getting worse. °· You have questions about your medicine. °· You have chills, shortness of breath, or brown or red spit (mucus). °· You have yellow or brown snot (nasal discharge) or pain in the face, especially when you bend forward. °· You have a fever, puffy (swollen) neck, pain when you swallow, or white spots in the back of your throat. °· You have a bad headache, ear pain, sinus pain, or chest pain. °· You have a high-pitched whistling sound when you breathe in and out (wheezing). °· You have a lasting cough or cough up blood. °· You have sore muscles or a stiff neck. °MAKE SURE YOU:  °· Understand these instructions. °· Will watch your condition. °· Will get help right away if you are not  doing well or get worse. °Document Released: 03/22/2008 Document Revised: 12/27/2011 Document Reviewed: 01/09/2014 °ExitCare® Patient Information ©2015 ExitCare, LLC. This information is not intended to replace advice given to you by your health care provider. Make sure you discuss any questions you have with your health care provider. ° °

## 2016-01-19 ENCOUNTER — Encounter (HOSPITAL_COMMUNITY): Payer: Self-pay | Admitting: *Deleted

## 2016-01-19 ENCOUNTER — Emergency Department (HOSPITAL_COMMUNITY)
Admission: EM | Admit: 2016-01-19 | Discharge: 2016-01-19 | Disposition: A | Discharge status: Home / Self Care | Payer: Medicaid Other | Attending: Emergency Medicine | Admitting: Emergency Medicine

## 2016-01-19 DIAGNOSIS — R51 Headache: Secondary | ICD-10-CM | POA: Diagnosis not present

## 2016-01-19 DIAGNOSIS — R509 Fever, unspecified: Secondary | ICD-10-CM | POA: Insufficient documentation

## 2016-01-19 DIAGNOSIS — F17210 Nicotine dependence, cigarettes, uncomplicated: Secondary | ICD-10-CM | POA: Insufficient documentation

## 2016-01-19 DIAGNOSIS — M791 Myalgia: Secondary | ICD-10-CM | POA: Diagnosis not present

## 2016-01-19 DIAGNOSIS — H53149 Visual discomfort, unspecified: Secondary | ICD-10-CM | POA: Diagnosis not present

## 2016-01-19 DIAGNOSIS — Z79899 Other long term (current) drug therapy: Secondary | ICD-10-CM | POA: Diagnosis not present

## 2016-01-19 MED ORDER — ACETAMINOPHEN 325 MG PO TABS
ORAL_TABLET | ORAL | Status: AC
  Filled 2016-01-19: qty 2

## 2016-01-19 MED ORDER — ACETAMINOPHEN 325 MG PO TABS
650.0000 mg | ORAL_TABLET | Freq: Once | ORAL | Status: AC | PRN
  Administered 2016-01-19: 650 mg via ORAL

## 2016-01-19 NOTE — Discharge Instructions (Signed)
Fever, Adult Take 650 mg of Tylenol every 6 hours or 600 mg of Motrin every 4 hours for fever. A fever is an increase in the body's temperature. It is often defined as a temperature of 100 F (38C) or higher. Short mild or moderate fevers often have no long-term effects. They also often do not need treatment. Moderate or high fevers may make you feel uncomfortable. Sometimes, they can also be a sign of a serious illness or disease. The sweating that may happen with repeated fevers or fevers that last a while may also cause you to not have enough fluid in your body (dehydration). You can take your temperature with a thermometer to see if you have a fever. A measured temperature can change with:  Age.  Time of day.  Where the thermometer is placed:  Mouth (oral).  Rectum (rectal).  Ear (tympanic).  Underarm (axillary).  Forehead (temporal). HOME CARE Pay attention to any changes in your symptoms. Take these actions to help with your condition:  Take over-the-counter and prescription medicines only as told by your doctor. Follow the dosing instructions carefully.  If you were prescribed an antibiotic medicine, take it as told by your doctor. Do not stop taking the antibiotic even if you start to feel better.  Rest as needed.  Drink enough fluid to keep your pee (urine) clear or pale yellow.  Sponge yourself or bathe with room-temperature water as needed. This helps to lower your body temperature . Do not use ice water.  Do not wear too many blankets or heavy clothes. GET HELP IF:  You throw up (vomit).  You cannot eat or drink without throwing up.  You have watery poop (diarrhea).  It hurts when you pee.  Your symptoms do not get better with treatment.  You have new symptoms.  You feel very weak. GET HELP RIGHT AWAY IF:  You are short of breath or have trouble breathing.  You are dizzy or you pass out (faint).  You feel confused.  You have signs of not having  enough fluid in your body, such as:  A dry mouth.  Peeing less.  Looking pale.  You have very bad pain in your belly (abdomen).  You keep throwing up or having water poop.  You have a skin rash.  Your symptoms suddenly get worse.   This information is not intended to replace advice given to you by your health care provider. Make sure you discuss any questions you have with your health care provider.   Document Released: 07/13/2008 Document Revised: 06/25/2015 Document Reviewed: 11/28/2014 Elsevier Interactive Patient Education [employer]2016 Elsevier Inc.

## 2016-01-19 NOTE — ED Notes (Signed)
Declined W/C at D/C and was escorted to lobby by RN. 
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
## 2016-07-02 ENCOUNTER — Emergency Department (HOSPITAL_COMMUNITY)
Admission: EM | Admit: 2016-07-02 | Discharge: 2016-07-02 | Disposition: A | Discharge status: Home / Self Care | Payer: Medicaid Other | Attending: Emergency Medicine | Admitting: Emergency Medicine

## 2016-07-02 ENCOUNTER — Encounter (HOSPITAL_COMMUNITY): Payer: Self-pay

## 2016-07-02 DIAGNOSIS — R1084 Generalized abdominal pain: Secondary | ICD-10-CM | POA: Insufficient documentation

## 2016-07-02 DIAGNOSIS — Z87891 Personal history of nicotine dependence: Secondary | ICD-10-CM | POA: Insufficient documentation

## 2016-07-02 DIAGNOSIS — R1010 Upper abdominal pain, unspecified: Secondary | ICD-10-CM | POA: Diagnosis present

## 2016-07-02 LAB — CBC
HCT: 31.5 % — ABNORMAL LOW (ref 36.0–46.0)
HEMOGLOBIN: 10 g/dL — AB (ref 12.0–15.0)
MCH: 31.4 pg (ref 26.0–34.0)
MCHC: 31.7 g/dL (ref 30.0–36.0)
MCV: 99.1 fL (ref 78.0–100.0)
PLATELETS: 210 10*3/uL (ref 150–400)
RBC: 3.18 MIL/uL — ABNORMAL LOW (ref 3.87–5.11)
RDW: 12.3 % (ref 11.5–15.5)
WBC: 3.3 10*3/uL — AB (ref 4.0–10.5)

## 2016-07-02 LAB — URINALYSIS, ROUTINE W REFLEX MICROSCOPIC
Bilirubin Urine: NEGATIVE
Glucose, UA: NEGATIVE mg/dL
HGB URINE DIPSTICK: NEGATIVE
Ketones, ur: NEGATIVE mg/dL
LEUKOCYTES UA: NEGATIVE
NITRITE: NEGATIVE
PROTEIN: NEGATIVE mg/dL
SPECIFIC GRAVITY, URINE: 1.026 (ref 1.005–1.030)
pH: 6 (ref 5.0–8.0)

## 2016-07-02 LAB — COMPREHENSIVE METABOLIC PANEL
ALK PHOS: 51 U/L (ref 38–126)
ALT: 12 U/L — AB (ref 14–54)
AST: 22 U/L (ref 15–41)
Albumin: 3.9 g/dL (ref 3.5–5.0)
Anion gap: 6 (ref 5–15)
BUN: 10 mg/dL (ref 6–20)
CALCIUM: 9.2 mg/dL (ref 8.9–10.3)
CO2: 24 mmol/L (ref 22–32)
CREATININE: 0.77 mg/dL (ref 0.44–1.00)
Chloride: 109 mmol/L (ref 101–111)
Glucose, Bld: 98 mg/dL (ref 65–99)
Potassium: 4.2 mmol/L (ref 3.5–5.1)
Sodium: 139 mmol/L (ref 135–145)
TOTAL PROTEIN: 6.7 g/dL (ref 6.5–8.1)
Total Bilirubin: 0.4 mg/dL (ref 0.3–1.2)

## 2016-07-02 LAB — LIPASE, BLOOD: Lipase: 23 U/L (ref 11–51)

## 2016-07-02 LAB — PREGNANCY, URINE: PREG TEST UR: NEGATIVE

## 2016-07-02 MED ORDER — DICYCLOMINE HCL 20 MG PO TABS: 20.0000 mg | ORAL_TABLET | Freq: Two times a day (BID) | ORAL | 0 refills | Status: DC

## 2016-07-02 NOTE — ED Notes (Addendum)
Note made in error

## 2016-07-02 NOTE — ED Notes (Signed)
Patient refused discharge vital signs walked from one pod to another in the ED and stated she was leaving. 2 nurses spoke with patient and obtain and gave discharge papers to patient.

## 2016-07-02 NOTE — ED Triage Notes (Signed)
Per pT, Pt is coming from home. Pt reports having a tubal ligation four years ago with pain that has continued since then. Pt was to see specialist, but was unable to see the specialist. Pt reports the pain worsening in the last five days. Pt reports increase of nausea and diarrhea. Denies vaginal discharge.

## 2016-07-02 NOTE — ED Provider Notes (Signed)
[facility] DEPT Provider Note   CSN: [id number] Arrival date & time: 07/02/16  4098   History   Chief Complaint Chief Complaint  Patient presents with  . Abdominal Pain    HPI Jennifer Combs is a 30 y.o. female.  The history is provided by the patient and medical records.   30 year old G4P4 (all vaginal deliveries), s/p tubal ligation presenting with abd pain. Onset years ago, immediately following her tubal ligation. Waxing and waning since then. Located in the upper abdomen and then radiating down throughout entire abdomen. Described as burning. Worsened by eating, and patient states she has lost weight because of poor by mouth intake related to this. Also worsened by emotional upset and stress, and she thinks her symptoms recently have worsened because of marital issues with her spouse. Associated with nausea but no vomiting, and some loose nonbloody stools. However, sometimes patient also has constipation. Denies urinary symptoms. Denies vaginal discharge or bleeding. LMP about 3 weeks ago. States she was seen [facility] for comprehensive STI check recently with pelvic exam, vaginal and rectal swabs, and blood tests for HIV and states that all of this was remarkable only for bacterial vaginosis, which she finished antibiotics for but has noticed no difference in symptoms. She has been on PPI in the past but this has been stopped because it didn't help. She has been referred to OB/Gyn and GI in the past for this, but was unable to get the proper referral paperwork to be seen.    Past Medical History:  Diagnosis Date  . Ectopic pregnancy, tubal     There are no active problems to display for this patient.   Past Surgical History:  Procedure Laterality Date  . TUBAL LIGATION      OB History    No data available       Home Medications    Prior to Admission medications   Medication Sig Start Date End Date Taking? Authorizing Provider  dicyclomine  (BENTYL) 20 MG tablet Take 1 tablet (20 mg total) by mouth 2 (two) times daily. 07/02/16   Urban Gibson, MD    Family History No family history on file.  Social History Social History  Substance Use Topics  . Smoking status: Former Games developer  . Smokeless tobacco: Never Used  . Alcohol use No     Allergies   Citrus and Tomato   Review of Systems Review of Systems  Constitutional: Negative for fever.  Respiratory: Negative for shortness of breath.   Cardiovascular: Negative for chest pain.  Gastrointestinal: Positive for abdominal pain, constipation, diarrhea and nausea. Negative for blood in stool and vomiting.  Genitourinary: Negative for dysuria, hematuria, vaginal bleeding and vaginal discharge.  Allergic/Immunologic: Negative for immunocompromised state.  Psychiatric/Behavioral: The patient is nervous/anxious.   All other systems reviewed and are negative.   Physical Exam Updated Vital Signs BP 118/67   Pulse 68   Temp 98.3 F (36.8 C) (Oral)   Resp 14   Ht 4\' 11"  (1.499 m)   Wt 56.7 kg   SpO2 100%   BMI 25.25 kg/m   Physical Exam  Constitutional: She appears well-developed and well-nourished. No distress.  HENT:  Head: Normocephalic and atraumatic.  Eyes: Conjunctivae are normal.  Neck: Neck supple.  Cardiovascular: Normal rate and regular rhythm.   No murmur heard. Pulmonary/Chest: Effort normal and breath sounds normal. No respiratory distress.  Abdominal: Soft. Bowel sounds are normal. She exhibits no distension and no mass. There is no  tenderness. There is no guarding.  Musculoskeletal: She exhibits no edema.  Neurological: She is alert.  Skin: Skin is warm and dry.  Psychiatric: She has a normal mood and affect.  Nursing note and vitals reviewed.   ED Treatments / Results  Labs (all labs ordered are listed, but only abnormal results are displayed) Labs Reviewed  COMPREHENSIVE METABOLIC PANEL - Abnormal; Notable for the following:       Result  Value   ALT 12 (*)    All other components within normal limits  CBC - Abnormal; Notable for the following:    WBC 3.3 (*)    RBC 3.18 (*)    Hemoglobin 10.0 (*)    HCT 31.5 (*)    All other components within normal limits  URINALYSIS, ROUTINE W REFLEX MICROSCOPIC (NOT AT [facility]) - Abnormal; Notable for the following:    APPearance CLOUDY (*)    All other components within normal limits  LIPASE, BLOOD  PREGNANCY, URINE    EKG  EKG Interpretation None       Radiology No results found.  Procedures Procedures (including critical care time)   EMERGENCY DEPARTMENT US GALLBLADDER INTERPRETATION "Study: Limited Ultrasound of the gallbladder and common bile duct."  INDICATIONS: Abdominal pain and Nausea Indication: Multiple views of the gallbladder and common bile duct are obtained with a  Multi-frequency probe."  PERFORMED BY:  Myself and Marily MemosJason Mesner MD  IMAGES ARCHIVED?: Yes  FINDINGS: Gallstones absent, Gallbladder wall normal in thickness, Sonographic Murphy's sign absent and Common bile duct normal in size  LIMITATIONS: Normal  INTERPRETATION: Normal  COMMENT:     Medications Ordered in ED Medications - No data to display   Initial Impression / Assessment and Plan / ED Course  I have reviewed the triage vital signs and the nursing notes.  Pertinent labs & imaging results that were available during my care of the patient were reviewed by me and considered in my medical decision making (see chart for details).  Clinical Course    30 year old 24P4 (all vaginal deliveries), s/p tubal ligation presenting with diffuse abdominal pain, somewhat acute today, but predominantly a chronic issue for several years- reportedly ever since her tubal ligation. Patient is afebrile with stable vitals. Her abdominal exam is benign. Electrolytes, UA, pregnancy test, LFTs, renal function, CBC all are unremarkable. She reports just having had a pelvic exam and comprehensive STI check  and does not feel this needs to be redone as she has no new symptoms or partners. Bedside US negative for biliary pathology. Do not feel emergent acute intra-abdominal process is likely and do not feel pt warrants further ED workup with any additional imaging or treatment. Do feel she would benefit from seeing GI as well as OB/Gyn in f/u regarding these issues. Case management spoke with patient to help facilitate and patient is agreeable with this plan. Return precautions discussed. Will trial sx management with bentyl in interim. Dc in stable condition.   Case discussed with Dr. Clayborne DanaMesner who oversaw management of this patient.    Final Clinical Impressions(s) / ED Diagnoses   Final diagnoses:  Generalized abdominal pain    New Prescriptions New Prescriptions   DICYCLOMINE (BENTYL) 20 MG TABLET    Take 1 tablet (20 mg total) by mouth 2 (two) times daily.     Urban GibsonJenny Marjan Rosman, MD 07/02/16 1412    Marily MemosJason Mesner, MD 07/02/16 [phone]

## 2016-07-02 NOTE — ED Notes (Signed)
Pt witnessed attempting to leave without discharge papers. Seen pt was up for discharge, d/c papers given to patient and verbal instructions reviewed, patient verbalizes understanding and agrees to follow up instructions. Denies any further questions. Unable to do vital signs or have patient sign due to patient being in extreme hurry to leave and pick up children. Pt in NAD. Ambulatory. Vitals obtained at 13:27 are stable.

## 2016-07-02 NOTE — Discharge Planning (Signed)
EDCM spoke to pt at bedside.  Explained that [facility] is PCP on Medicaid card and she must receive referral from PCP for GI.  [facility]  Pt verbalized understanding; no further CM needs communicated at this time.

## 2016-07-02 NOTE — ED Notes (Signed)
MD at bedside. 

## 2016-09-28 ENCOUNTER — Encounter (HOSPITAL_COMMUNITY): Payer: Self-pay | Admitting: Emergency Medicine

## 2016-09-28 ENCOUNTER — Emergency Department (HOSPITAL_COMMUNITY)
Admission: EM | Admit: 2016-09-28 | Discharge: 2016-09-28 | Disposition: A | Discharge status: Home / Self Care | Payer: Medicaid Other | Attending: Emergency Medicine | Admitting: Emergency Medicine

## 2016-09-28 DIAGNOSIS — Z87891 Personal history of nicotine dependence: Secondary | ICD-10-CM | POA: Diagnosis not present

## 2016-09-28 DIAGNOSIS — Y990 Civilian activity done for income or pay: Secondary | ICD-10-CM | POA: Insufficient documentation

## 2016-09-28 DIAGNOSIS — Y9389 Activity, other specified: Secondary | ICD-10-CM | POA: Diagnosis not present

## 2016-09-28 DIAGNOSIS — Y9259 Other trade areas as the place of occurrence of the external cause: Secondary | ICD-10-CM | POA: Insufficient documentation

## 2016-09-28 DIAGNOSIS — R1084 Generalized abdominal pain: Secondary | ICD-10-CM | POA: Diagnosis not present

## 2016-09-28 DIAGNOSIS — X500XXA Overexertion from strenuous movement or load, initial encounter: Secondary | ICD-10-CM | POA: Diagnosis not present

## 2016-09-28 DIAGNOSIS — M545 Low back pain, unspecified: Secondary | ICD-10-CM

## 2016-09-28 LAB — COMPREHENSIVE METABOLIC PANEL
ALK PHOS: 59 U/L (ref 38–126)
ALT: 11 U/L — AB (ref 14–54)
AST: 29 U/L (ref 15–41)
Albumin: 4 g/dL (ref 3.5–5.0)
Anion gap: 5 (ref 5–15)
BUN: 12 mg/dL (ref 6–20)
CALCIUM: 9.2 mg/dL (ref 8.9–10.3)
CHLORIDE: 107 mmol/L (ref 101–111)
CO2: 27 mmol/L (ref 22–32)
Creatinine, Ser: 0.71 mg/dL (ref 0.44–1.00)
GFR calc Af Amer: >60 mL/min (ref >60)
GFR calc non Af Amer: >60 mL/min (ref >60)
GLUCOSE: 101 mg/dL — AB (ref 65–99)
Potassium: 4.4 mmol/L (ref 3.5–5.1)
Sodium: 139 mmol/L (ref 135–145)
Total Bilirubin: 0.5 mg/dL (ref 0.3–1.2)
Total Protein: 7 g/dL (ref 6.5–8.1)

## 2016-09-28 LAB — URINALYSIS, ROUTINE W REFLEX MICROSCOPIC
Bilirubin Urine: NEGATIVE
GLUCOSE, UA: NEGATIVE mg/dL
Hgb urine dipstick: NEGATIVE
Ketones, ur: NEGATIVE mg/dL
LEUKOCYTES UA: NEGATIVE
NITRITE: NEGATIVE
PROTEIN: 30 mg/dL — AB
Specific Gravity, Urine: 1.02 (ref 1.005–1.030)
pH: 6.5 (ref 5.0–8.0)

## 2016-09-28 LAB — URINALYSIS, MICROSCOPIC (REFLEX)

## 2016-09-28 LAB — CBC
HCT: 29.7 % — ABNORMAL LOW (ref 36.0–46.0)
Hemoglobin: 9.8 g/dL — ABNORMAL LOW (ref 12.0–15.0)
MCH: 32 pg (ref 26.0–34.0)
MCHC: 33 g/dL (ref 30.0–36.0)
MCV: 97.1 fL (ref 78.0–100.0)
Platelets: 232 10*3/uL (ref 150–400)
RBC: 3.06 MIL/uL — AB (ref 3.87–5.11)
RDW: 12.3 % (ref 11.5–15.5)
WBC: 3.8 10*3/uL — AB (ref 4.0–10.5)

## 2016-09-28 LAB — POC URINE PREG, ED: Preg Test, Ur: NEGATIVE

## 2016-09-28 LAB — LIPASE, BLOOD: Lipase: 18 U/L (ref 11–51)

## 2016-09-28 MED ORDER — DICYCLOMINE HCL 10 MG PO CAPS
10.0000 mg | ORAL_CAPSULE | Freq: Once | ORAL | Status: AC
  Administered 2016-09-28: 10 mg via ORAL
  Filled 2016-09-28: qty 1

## 2016-09-28 MED ORDER — ONDANSETRON HCL 4 MG PO TABS: 4.0000 mg | ORAL_TABLET | Freq: Four times a day (QID) | ORAL | 0 refills | Status: DC

## 2016-09-28 MED ORDER — GI COCKTAIL ~~LOC~~
30.0000 mL | Freq: Once | ORAL | Status: AC
  Administered 2016-09-28: 30 mL via ORAL
  Filled 2016-09-28: qty 30

## 2016-09-28 MED ORDER — DICYCLOMINE HCL 20 MG PO TABS: 20.0000 mg | ORAL_TABLET | Freq: Two times a day (BID) | ORAL | 0 refills | Status: DC

## 2016-09-28 NOTE — ED Provider Notes (Signed)
[facility] Provider Note   CSN: [id number] Arrival date & time: 09/28/16  0944  By signing my name below, I, Placido SouLogan Joldersma, attest that this documentation has been prepared under the direction and in the presence of Audry Piliyler Pesach Frisch, PA-C. Electronically Signed: Placido SouLogan Joldersma, ED Scribe. 09/28/16. 10:37 AM.   History   Chief Complaint Chief Complaint  Patient presents with  . Back Pain  . Nausea    HPI HPI Comments: Jennifer Combs is a 30 y.o. female who presents to the Emergency Department with multiple complaints. 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 Beginning two months ago she began experiencing intermittent n/v, worsening abdominal pain, diffuse lower back pain, bilateral lateral hip pain and "black spots" on her tongue which feel "irritable". She has been evaluated for her abdominal pain in the past which she states began four years ago s/p tubal ligation and is taking tramadol prn for her back pain. Pt states her n/v is very infrequent and denies it worsens s/p eating. She has been taking bentyl which has provided moderate relief of her abdominal issues. Pt denies any recent abx use. She denies bowel or bladder incontinence, fevers, chills, vaginal bleeding, vaginal discharge, dysuria, congestion, sinus pressure, HA or other associated symptoms at this time.   The history is provided by the patient and medical records. No language interpreter was used.    Past Medical History:  Diagnosis Date  . Ectopic pregnancy, tubal     There are no active problems to display for this patient.   Past Surgical History:  Procedure Laterality Date  . TUBAL LIGATION      OB History    No data available       Home Medications    Prior to Admission medications   Medication Sig Start Date End Date Taking? Authorizing Provider  dicyclomine (BENTYL) 20 MG tablet Take 1 tablet (20 mg total) by mouth 2 (two) times  daily. 07/02/16   Urban GibsonJenny Beatty, MD    Family History No family history on file.  Social History Social History  Substance Use Topics  . Smoking status: Former Games developermoker  . Smokeless tobacco: Never Used  . Alcohol use No     Allergies   Citrus and Tomato   Review of Systems Review of Systems  Constitutional: Negative for chills and fever.  HENT: Negative for congestion, rhinorrhea, sinus pain and sinus pressure.   Gastrointestinal: Positive for abdominal pain, nausea and vomiting.  Genitourinary: Negative for dysuria, vaginal bleeding and vaginal discharge.  Musculoskeletal: Positive for arthralgias, back pain and myalgias.  Neurological: Negative for numbness and headaches.  A complete 10 system review of systems was obtained and all systems are negative except as noted in the HPI and PMH.   Physical Exam Updated Vital Signs BP 105/60 (BP Location: Right Arm)   Pulse 69   Temp 98.2 F (36.8 C) (Oral)   Resp 18   Ht 4\' 11"  (1.499 m)   Wt 140 lb (63.5 kg)   SpO2 100%   BMI 28.28 kg/m   Physical Exam  Constitutional: She is oriented to person, place, and time. Vital signs are normal. She appears well-developed and well-nourished.  Texting on Phone no apparent distress  HENT:  Head: Normocephalic and atraumatic.  Right Ear: Hearing normal.  Left Ear: Hearing normal.  Eyes: Conjunctivae and EOM are normal. Pupils are equal, round, and reactive to light.  Neck: Normal range of motion. Neck  supple.  Cardiovascular: Normal rate, regular rhythm, normal heart sounds and intact distal pulses.   Pulmonary/Chest: Effort normal and breath sounds normal. No respiratory distress.  Abdominal: Soft. Bowel sounds are normal. There is no tenderness. There is no rigidity, no rebound, no guarding, no CVA tenderness, no tenderness at McBurney's point and negative Murphy's sign.  Abdomen soft  Musculoskeletal: Normal range of motion.  Neurological: She is alert and oriented to person,  place, and time.  Skin: Skin is warm and dry.  Psychiatric: She has a normal mood and affect. Her speech is normal and behavior is normal. Thought content normal.  Nursing note and vitals reviewed.  ED Treatments / Results  Labs (all labs ordered are listed, but only abnormal results are displayed) Labs Reviewed - No data to display  EKG  EKG Interpretation None       Radiology No results found.  Procedures Procedures  DIAGNOSTIC STUDIES: Oxygen Saturation is 100% on RA, normal by my interpretation.    COORDINATION OF CARE: 10:37 AM Discussed next steps with pt. Pt verbalized understanding and is agreeable with the plan.    Medications Ordered in ED Medications - No data to display   Initial Impression / Assessment and Plan / ED Course  I have reviewed the triage vital signs and the nursing notes.  Pertinent labs & imaging results that were available during my care of the patient were reviewed by me and considered in my medical decision making (see chart for details).  Clinical Course    Final Clinical Impressions(s) / ED Diagnoses  {I have reviewed and evaluated the relevant laboratory values.   {I have reviewed the relevant previous healthcare records.  {I obtained HPI from historian.   ED Course:  Assessment: Patient is a 30yF presents with abdominal pain x 4 years. Worsening x 2 months. Back pain as well that is likely musculoskeletal based on HPI. Noted lifting injury in past. No loss of bowel or bladder function. No saddle anesthesia. . On exam, nontoxic, nonseptic appearing, in no apparent distress. Patient's pain and other symptoms adequately managed in emergency department. Labs, imaging and vitals reviewed.  Patient does not meet the SIRS or Sepsis criteria.  On repeat exam patient does not have a surgical abdomen and there are no peritoneal signs.  No indication of appendicitis, bowel obstruction, bowel perforation, cholecystitis, diverticulitis, PID or ectopic  pregnancy. Given Rx Bentyl as pt states that this medication helped a lot with her symptoms. Will Rx robaxin for muscle spasm in low back. Patient discharged home with symptomatic treatment and given strict instructions for follow-up with their primary care physician.  I have also discussed reasons to return immediately to the ER.  Patient expresses understanding and agrees with plan.  Disposition/Plan:  DC Home Additional Verbal discharge instructions given and discussed with patient.  Pt Instructed to f/u with PCP in the next week for evaluation and treatment of symptoms. Return precautions given Pt acknowledges and agrees with plan  Supervising Physician Tilden FossaElizabeth Rees, MD  Final diagnoses:  Generalized abdominal pain  Bilateral low back pain without sciatica, unspecified chronicity    New Prescriptions New Prescriptions   No medications on file     Audry Piliyler Mckenzie Toruno, PA-C 09/28/16 1204    Tilden FossaElizabeth Rees, MD 09/29/16 [phone]

## 2016-09-28 NOTE — Discharge Instructions (Signed)
Please read and follow all provided instructions.  Your diagnoses today include:  1. Generalized abdominal pain   2. Bilateral low back pain without sciatica, unspecified chronicity    Tests performed today include: Blood counts and electrolytes Blood tests to check liver and kidney function Blood tests to check pancreas function Urine test to look for infection and pregnancy (in women) Vital signs. See below for your results today.   Medications prescribed:   Take any prescribed medications only as directed.  Home care instructions:  Follow any educational materials contained in this packet.  Follow-up instructions: Please follow-up with your primary care provider in the next 2 days for further evaluation of your symptoms.    Return instructions:  SEEK IMMEDIATE MEDICAL ATTENTION IF: The pain does not go away or becomes severe  A temperature above 101F develops  Repeated vomiting occurs (multiple episodes)  The pain becomes localized to portions of the abdomen. The right side could possibly be appendicitis. In an adult, the left lower portion of the abdomen could be colitis or diverticulitis.  Blood is being passed in stools or vomit (bright red or black tarry stools)  You develop chest pain, difficulty breathing, dizziness or fainting, or become confused, poorly responsive, or inconsolable (young children) If you have any other emergent concerns regarding your health  Additional Information: Abdominal (belly) pain can be caused by many things. Your caregiver performed an examination and possibly ordered blood/urine tests and imaging (CT scan, x-rays, ultrasound). Many cases can be observed and treated at home after initial evaluation in the emergency department. Even though you are being discharged home, abdominal pain can be unpredictable. Therefore, you need a repeated exam if your pain does not resolve, returns, or worsens. Most patients with abdominal pain don't have to be  admitted to the hospital or have surgery, but serious problems like appendicitis and gallbladder attacks can start out as nonspecific pain. Many abdominal conditions cannot be diagnosed in one visit, so follow-up evaluations are very important.  Your vital signs today were: BP 105/60 (BP Location: Right Arm)    Pulse 69    Temp 98.2 F (36.8 C) (Oral)    Resp 18    Ht 4\' 11"  (1.499 m)    Wt 63.5 kg    LMP 09/23/2016    SpO2 100%    BMI 28.28 kg/m  If your blood pressure (bp) was elevated above 135/85 this visit, please have this repeated by your doctor within one month. --------------

## 2016-09-28 NOTE — ED Notes (Signed)
ED Provider at bedside. 

## 2016-09-28 NOTE — ED Triage Notes (Signed)
PT reports lower back pain, bilateral hip pain, intermittent nausea, and "black spots on tongue." PT reports she vomited two days ago. PT has tried to get into PCP office. PT had a shortened menstrual period that ended two days ago and was only on for three days vs a normal six days for her.

## 2016-10-27 ENCOUNTER — Inpatient Hospital Stay: Payer: Medicaid Other

## 2016-11-01 ENCOUNTER — Inpatient Hospital Stay: Payer: Medicaid Other

## 2016-11-09 ENCOUNTER — Inpatient Hospital Stay: Payer: Medicaid Other | Admitting: Internal Medicine

## 2016-12-31 ENCOUNTER — Emergency Department (HOSPITAL_COMMUNITY)
Admission: EM | Admit: 2016-12-31 | Discharge: 2016-12-31 | Disposition: A | Discharge status: Home / Self Care | Payer: Medicaid Other | Attending: Emergency Medicine | Admitting: Emergency Medicine

## 2016-12-31 ENCOUNTER — Encounter (HOSPITAL_COMMUNITY): Payer: Self-pay

## 2016-12-31 ENCOUNTER — Emergency Department (HOSPITAL_COMMUNITY)
Admission: EM | Admit: 2016-12-31 | Discharge: 2016-12-31 | Disposition: A | Discharge status: Home / Self Care | Payer: Medicaid Other | Source: Home / Self Care | Attending: Emergency Medicine | Admitting: Emergency Medicine

## 2016-12-31 DIAGNOSIS — Z87891 Personal history of nicotine dependence: Secondary | ICD-10-CM | POA: Insufficient documentation

## 2016-12-31 DIAGNOSIS — Z79899 Other long term (current) drug therapy: Secondary | ICD-10-CM | POA: Insufficient documentation

## 2016-12-31 DIAGNOSIS — R112 Nausea with vomiting, unspecified: Secondary | ICD-10-CM

## 2016-12-31 DIAGNOSIS — R197 Diarrhea, unspecified: Secondary | ICD-10-CM | POA: Diagnosis not present

## 2016-12-31 LAB — URINALYSIS, ROUTINE W REFLEX MICROSCOPIC
Bacteria, UA: NONE SEEN
Bilirubin Urine: NEGATIVE
GLUCOSE, UA: NEGATIVE mg/dL
Hgb urine dipstick: NEGATIVE
Ketones, ur: NEGATIVE mg/dL
Leukocytes, UA: NEGATIVE
Nitrite: NEGATIVE
PH: 7 (ref 5.0–8.0)
PROTEIN: 100 mg/dL — AB
Specific Gravity, Urine: 1.016 (ref 1.005–1.030)

## 2016-12-31 LAB — I-STAT CHEM 8, ED
BUN: 9 mg/dL (ref 6–20)
CREATININE: 0.8 mg/dL (ref 0.44–1.00)
Calcium, Ion: 1.13 mmol/L — ABNORMAL LOW (ref 1.15–1.40)
Chloride: 104 mmol/L (ref 101–111)
Glucose, Bld: 88 mg/dL (ref 65–99)
HEMATOCRIT: 31 % — AB (ref 36.0–46.0)
HEMOGLOBIN: 10.5 g/dL — AB (ref 12.0–15.0)
POTASSIUM: 3.9 mmol/L (ref 3.5–5.1)
SODIUM: 139 mmol/L (ref 135–145)
TCO2: 24 mmol/L (ref 0–100)

## 2016-12-31 LAB — POC URINE PREG, ED: Preg Test, Ur: NEGATIVE

## 2016-12-31 MED ORDER — DICYCLOMINE HCL 20 MG PO TABS: 20.0000 mg | ORAL_TABLET | Freq: Two times a day (BID) | ORAL | 0 refills | Status: DC

## 2016-12-31 MED ORDER — ONDANSETRON 4 MG PO TBDP
ORAL_TABLET | ORAL | Status: AC
  Filled 2016-12-31: qty 1

## 2016-12-31 MED ORDER — SODIUM CHLORIDE 0.9 % IV BOLUS (SEPSIS): 1000.0000 mL | Freq: Once | INTRAVENOUS | Status: DC

## 2016-12-31 MED ORDER — ONDANSETRON 4 MG PO TBDP
4.0000 mg | ORAL_TABLET | Freq: Once | ORAL | Status: AC | PRN
  Administered 2016-12-31: 4 mg via ORAL

## 2016-12-31 NOTE — ED Provider Notes (Signed)
[facility]-EMERGENCY DEPT Provider Note   CSN: [id number] Arrival date & time: 12/31/16  4098     History   Chief Complaint Chief Complaint  Patient presents with  . Emesis  . Diarrhea    HPI Jennifer Combs is a 31 y.o. female.  HPI   31 year old female presenting complaining of persistent nausea. Patient report 2 weeks ago after eating some spicy shrimp at [employer] she has had recurrent bouts of nausea. States she vomited twice on the first day after eating and several more 3 days later but none since. Continues to endorse nausea, this morning she developed non-bloody non-mucousy loose stools. Reports uneasiness sensation in her stomach. States that she has a history of irritable bowel syndrome, using Bentyl as needed but she ran out of the medication approximately a month ago and unsure of her symptoms related to that. He is sexually active with the same partner, using protection on occasion. Remote history of Trichomonas. Denies having fever, chills, headache, chest pain, shortness of breath, productive cough, back pain, dysuria, hematuria, vaginal bleeding or vaginal discharge. Her last menstrual period was February 24. Currently denies any active abdominal pain.  Past Medical History:  Diagnosis Date  . Ectopic pregnancy, tubal     There are no active problems to display for this patient.   Past Surgical History:  Procedure Laterality Date  . TUBAL LIGATION      OB History    No data available       Home Medications    Prior to Admission medications   Medication Sig Start Date End Date Taking? Authorizing Provider  dicyclomine (BENTYL) 20 MG tablet Take 1 tablet (20 mg total) by mouth 2 (two) times daily. 09/28/16   Audry Pili, PA-C  ondansetron (ZOFRAN) 4 MG tablet Take 1 tablet (4 mg total) by mouth every 6 (six) hours. 09/28/16   Audry Pili, PA-C    Family History No family history on file.  Social History Social History  Substance Use Topics  . Smoking  status: Former Games developer  . Smokeless tobacco: Never Used  . Alcohol use No     Allergies   Citrus and Tomato   Review of Systems Review of Systems  All other systems reviewed and are negative.    Physical Exam Updated Vital Signs BP (!) 100/59 (BP Location: Right Arm)   Pulse 79   Temp 98.6 F (37 C) (Oral)   Resp 16   LMP 12/11/2016 (Exact Date)   SpO2 99%   Physical Exam  Constitutional: She is oriented to person, place, and time. She appears well-developed and well-nourished. No distress.  HENT:  Head: Atraumatic.  Mouth/Throat: Oropharynx is clear and moist.  Eyes: Conjunctivae are normal.  Neck: Neck supple.  Cardiovascular: Normal rate and regular rhythm.   Pulmonary/Chest: Effort normal and breath sounds normal.  Abdominal: Soft. Bowel sounds are normal. She exhibits no distension. There is no tenderness.  Neurological: She is alert and oriented to person, place, and time.  Skin: No rash noted.  Psychiatric: She has a normal mood and affect.  Nursing note and vitals reviewed.    ED Treatments / Results  Labs (all labs ordered are listed, but only abnormal results are displayed) Labs Reviewed  URINALYSIS, ROUTINE W REFLEX MICROSCOPIC - Abnormal; Notable for the following:       Result Value   Protein, ur 100 (*)    Squamous Epithelial / LPF 0-5 (*)    All other components within normal limits  I-STAT CHEM 8, ED - Abnormal; Notable for the following:    Calcium, Ion 1.13 (*)    Hemoglobin 10.5 (*)    HCT 31.0 (*)    All other components within normal limits  POC URINE PREG, ED    EKG  EKG Interpretation None       Radiology No results found.  Procedures Procedures (including critical care time)  Medications Ordered in ED Medications  ondansetron (ZOFRAN-ODT) 4 MG disintegrating tablet (not administered)  ondansetron (ZOFRAN-ODT) disintegrating tablet 4 mg (4 mg Oral Given 12/31/16 1008)     Initial Impression / Assessment and Plan / ED  Course  I have reviewed the triage vital signs and the nursing notes.  Pertinent labs & imaging results that were available during my care of the patient were reviewed by me and considered in my medical decision making (see chart for details).    BP (!) 97/58   Pulse 63   Temp 98.6 F (37 C) (Oral)   Resp 18   LMP 12/11/2016 (Exact Date)   SpO2 98%    Final Clinical Impressions(s) / ED Diagnoses   Final diagnoses:  Nausea vomiting and diarrhea    New Prescriptions Current Discharge Medication List     10:46 AM Patient here with complaints of nausea, occasional diarrhea, and mild discomfort in her abdomen. She has history of IBS and has been without Bentyl that she uses on occasion. She is well-appearing, does not appear to be dehydrated, she is afebrile with stable vital sign. We'll perform a pregnancy test, basic labs, provide antinausea medication, and we'll monitor.  12:44 PM Labs are reassuring.  Pt resting comfortably.  Stable for discharge.  Will refill her bentyl.     Fayrene HelperBowie Yancy Knoble, PA-C 12/31/16 1244    Rolan BuccoMelanie Belfi, MD 12/31/16 [phone]

## 2016-12-31 NOTE — ED Triage Notes (Signed)
Pt presents for evaluation of ongoing N/V x 2 weeks with new onset diarrhea today. Pt reports she feels she has food poisoning x 2 weeks. Reports poor PO intake, unable to keep anything down. Pt Ambulatory, AxO x4.

## 2016-12-31 NOTE — ED Notes (Signed)
Pt reports that she has to leave and pick up her daughter. Pt states, "My daughter's school called & we will be coming back because they say she has pink eye." pt aware she will need to check back in upon arrival

## 2016-12-31 NOTE — Discharge Instructions (Signed)
Please take Bentyl as needed for your condition.  Return if you develop fever, intense abdominal pain or if you have other concerns.

## 2017-01-06 ENCOUNTER — Ambulatory Visit: Payer: Medicaid Other | Attending: Internal Medicine | Admitting: Physician Assistant

## 2017-01-06 ENCOUNTER — Encounter: Payer: Self-pay | Admitting: Physician Assistant

## 2017-01-06 VITALS — BP 123/74 | HR 64 | Temp 98.1°F | Resp 16 | Wt 131.6 lb

## 2017-01-06 DIAGNOSIS — R112 Nausea with vomiting, unspecified: Secondary | ICD-10-CM | POA: Diagnosis not present

## 2017-01-06 DIAGNOSIS — R5383 Other fatigue: Secondary | ICD-10-CM

## 2017-01-06 DIAGNOSIS — R1013 Epigastric pain: Secondary | ICD-10-CM | POA: Diagnosis not present

## 2017-01-06 DIAGNOSIS — R52 Pain, unspecified: Secondary | ICD-10-CM | POA: Diagnosis not present

## 2017-01-06 DIAGNOSIS — M791 Myalgia: Secondary | ICD-10-CM | POA: Diagnosis not present

## 2017-01-06 LAB — TSH: TSH: 0.68 mIU/L

## 2017-01-06 MED ORDER — DICYCLOMINE HCL 20 MG PO TABS: 20.0000 mg | ORAL_TABLET | Freq: Two times a day (BID) | ORAL | 0 refills | Status: DC

## 2017-01-06 MED ORDER — ONDANSETRON HCL 4 MG PO TABS: 4.0000 mg | ORAL_TABLET | Freq: Four times a day (QID) | ORAL | 0 refills | Status: DC
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
## 2017-01-06 NOTE — Patient Instructions (Signed)

## 2017-01-07 ENCOUNTER — Telehealth: Payer: Self-pay

## 2017-01-07 ENCOUNTER — Other Ambulatory Visit: Payer: Self-pay | Admitting: Physician Assistant

## 2017-01-07 DIAGNOSIS — A048 Other specified bacterial intestinal infections: Secondary | ICD-10-CM

## 2017-01-07 DIAGNOSIS — E559 Vitamin D deficiency, unspecified: Secondary | ICD-10-CM

## 2017-01-07 LAB — H. PYLORI BREATH TEST: H. pylori Breath Test: DETECTED — AB

## 2017-01-07 LAB — HCG, QUANTITATIVE, PREGNANCY: hCG, Beta Chain, Quant, S: <2 m[IU]/mL

## 2017-01-07 LAB — VITAMIN D 25 HYDROXY (VIT D DEFICIENCY, FRACTURES): Vit D, 25-Hydroxy: 9 ng/mL — ABNORMAL LOW (ref 30–100)

## 2017-01-07 MED ORDER — AMOXICILLIN 500 MG PO CAPS: 1000.0000 mg | ORAL_CAPSULE | Freq: Two times a day (BID) | ORAL | 0 refills | Status: DC

## 2017-01-07 MED ORDER — CLARITHROMYCIN 500 MG PO TABS: 500.0000 mg | ORAL_TABLET | Freq: Two times a day (BID) | ORAL | 0 refills | Status: DC

## 2017-01-07 MED ORDER — OMEPRAZOLE 20 MG PO CPDR: 20.0000 mg | DELAYED_RELEASE_CAPSULE | Freq: Two times a day (BID) | ORAL | 0 refills | Status: DC

## 2017-01-07 MED ORDER — VITAMIN D (ERGOCALCIFEROL) 1.25 MG (50000 UNIT) PO CAPS: 50000.0000 [IU] | ORAL_CAPSULE | ORAL | 0 refills | Status: DC

## 2017-01-07 NOTE — Telephone Encounter (Signed)
Pt was called and informed of lab results. 

## 2017-04-04 ENCOUNTER — Ambulatory Visit: Payer: Medicaid Other | Attending: Internal Medicine | Admitting: Internal Medicine

## 2017-04-04 ENCOUNTER — Other Ambulatory Visit (HOSPITAL_COMMUNITY)
Admission: RE | Admit: 2017-04-04 | Discharge: 2017-04-04 | Disposition: A | Discharge status: Home / Self Care | Payer: Medicaid Other | Source: Ambulatory Visit | Attending: Certified Nurse Midwife | Admitting: Certified Nurse Midwife

## 2017-04-04 ENCOUNTER — Encounter: Payer: Self-pay | Admitting: Internal Medicine

## 2017-04-04 VITALS — BP 111/70 | HR 66 | Temp 97.9°F | Resp 16 | Wt 132.2 lb

## 2017-04-04 DIAGNOSIS — Z79899 Other long term (current) drug therapy: Secondary | ICD-10-CM | POA: Diagnosis not present

## 2017-04-04 DIAGNOSIS — R1013 Epigastric pain: Secondary | ICD-10-CM | POA: Diagnosis not present

## 2017-04-04 DIAGNOSIS — Z9889 Other specified postprocedural states: Secondary | ICD-10-CM | POA: Insufficient documentation

## 2017-04-04 DIAGNOSIS — F329 Major depressive disorder, single episode, unspecified: Secondary | ICD-10-CM | POA: Diagnosis not present

## 2017-04-04 DIAGNOSIS — D649 Anemia, unspecified: Secondary | ICD-10-CM

## 2017-04-04 DIAGNOSIS — Z87891 Personal history of nicotine dependence: Secondary | ICD-10-CM | POA: Diagnosis not present

## 2017-04-04 DIAGNOSIS — M79605 Pain in left leg: Secondary | ICD-10-CM | POA: Insufficient documentation

## 2017-04-04 DIAGNOSIS — R102 Pelvic and perineal pain: Secondary | ICD-10-CM | POA: Diagnosis not present

## 2017-04-04 DIAGNOSIS — R112 Nausea with vomiting, unspecified: Secondary | ICD-10-CM | POA: Diagnosis not present

## 2017-04-04 DIAGNOSIS — Z888 Allergy status to other drugs, medicaments and biological substances status: Secondary | ICD-10-CM | POA: Insufficient documentation

## 2017-04-04 DIAGNOSIS — F32A Depression, unspecified: Secondary | ICD-10-CM

## 2017-04-04 DIAGNOSIS — Z124 Encounter for screening for malignant neoplasm of cervix: Secondary | ICD-10-CM | POA: Diagnosis present

## 2017-04-04 DIAGNOSIS — R52 Pain, unspecified: Secondary | ICD-10-CM

## 2017-04-04 MED ORDER — DULOXETINE HCL 20 MG PO CPEP: 20.0000 mg | ORAL_CAPSULE | Freq: Every day | ORAL | 3 refills | Status: DC

## 2017-04-04 NOTE — Progress Notes (Addendum)
Patient ID: Jennifer Combs, female    DOB: November 18, 1985  MRN: [id number]  CC: Follow-up and Gynecologic Exam   Subjective: Jennifer Combs is a 31 y.o. female who presents for f/u visit Her concerns today include:   1. PAP: no abnormal in past -no vaginal discgh or itching -sexually active with 2 partners and uses condoms with one -has tubal ligation.  G7P4. -LNMP 6/8-08/2017.  Usually 3-5 days. Had a lot of clotting in April and June cycle - "I mean large, large clots."  Mild anemia on last visit -some pressure across lower abdomin intermittently and during intercourse. No dysuria  2. H.pylori positive on last visit.  Treated appropriately.  States she was suppose to be referred to a specialist.  I do not see where a referral was placed Still having problem with stomach.  Pt aggravated when asked to elaborate stating "that's why I hate coming to doctors because I repeat the same thing over and over."  Here is what was copied from last note:  She has had stomach/bowel issues for several years now.  She c/o intermittent loose stools, cramping, nausea and occasional vomiting.  She was prescribed bentyl and zofran in the ED which were helpful.  3.  Intermittent Pain on dorsal lateral surface LT foot that radiates up the left leg to the level of the thigh  + intermittent  numbness, burning and pain. Started 2-3 yrs ago.  Occurs about 4 x a wk -stopped wearing heels. -c/o pain all over her body -She becomes tearful in reporting how she is stressed out and depressed. Her parents live in [place] and she is afraid to tell them how she is really doing for fear that they would want her to move back to [employer] -4 kids and the youger with special needs. -Stays in her room a lot because she gets aggravated with her kids very easily and she does not want to be mean to them -taking care of father and kids.  Not work x 2 mths. No SI/HI Would like counseling  Current Outpatient Prescriptions  on File Prior to Visit  Medication Sig Dispense Refill  . dicyclomine (BENTYL) 20 MG tablet Take 1 tablet (20 mg total) by mouth 2 (two) times daily. 60 tablet 0  . omeprazole (PRILOSEC) 20 MG capsule Take 1 capsule (20 mg total) by mouth 2 (two) times daily before a meal. X 2 weeks then 1 daily for 2 weeks (Patient not taking: Reported on 04/04/2017) 42 capsule 0  . ondansetron (ZOFRAN) 4 MG tablet Take 1 tablet (4 mg total) by mouth every 6 (six) hours. (Patient not taking: Reported on 04/04/2017) 20 tablet 0  . Vitamin D, Ergocalciferol, (DRISDOL) 50000 units CAPS capsule Take 1 capsule (50,000 Units total) by mouth every 7 (seven) days. (Patient not taking: Reported on 04/04/2017) 16 capsule 0   No current facility-administered medications on file prior to visit.     Allergies  Allergen Reactions  . Citrus   . Tomato     MAKES MOUTH RAW.    Social History   Social History  . Marital status: Married    Spouse name: N/A  . Number of children: N/A  . Years of education: N/A   Occupational History  . Not on file.   Social History Main Topics  . Smoking status: Former Games developer  . Smokeless tobacco: Never Used  . Alcohol use No  . Drug use: No  . Sexual activity: Not on file  Other Topics Concern  . Not on file   Social History Narrative  . No narrative on file    No family history on file.  Past Surgical History:  Procedure Laterality Date  . TUBAL LIGATION      ROS: Review of Systems  PHYSICAL EXAM: BP 111/70   Pulse 66   Temp 97.9 F (36.6 C) (Oral)   Resp 16   Wt 132 lb 3.2 oz (60 kg)   SpO2 98%   BMI 26.70 kg/m   Physical Exam General appearance - alert, well appearing, and in no distress Mental status - acted aggravated at first.  Later became tearful Abdomen -soft, mild suprapubic tenderness Pelvic - [place], Present: No external vaginal lesions. No abnormal discharge in the vaginal vault. Cervix is grossly normal. No cervical motion tenderness.  Uterus feels slightly enlarged. No adnexal masses. Neurological -lower extremities: Gross sensation intact. Mild decrease sensation to light touch in the left lower leg laterally below the knee. Power 5/5 bilaterally. Peripheral pulses in both legs and feet are 3+ bilaterally  Depression screen PHQ 2/9 04/04/2017  Decreased Interest 1  Down, Depressed, Hopeless 1  PHQ - 2 Score 2  Altered sleeping 3  Tired, decreased energy 2  Change in appetite 2  Feeling bad or failure about yourself  0  Trouble concentrating 0  Moving slowly or fidgety/restless 0  Suicidal thoughts 0  PHQ-9 Score 9   GAD 7 : Generalized Anxiety Score 04/04/2017 01/06/2017  Nervous, Anxious, on Edge 0 0  Control/stop worrying 0 0  Worry too much - different things 0 1  Trouble relaxing 2 1  Restless 1 1  Easily annoyed or irritable 3 3  Afraid - awful might happen 0 0  Total GAD 7 Score 6 6    ASSESSMENT AND PLAN: 1. Pap smear for cervical cancer screening - Cytology - PAP  2. Pelvic pain -Safe sex practices encourage - US Pelvis Complete; Future - US Transvaginal Non-OB; Future - HIV antibody (with reflex) - Hepatitis c antibody (reflex) - RPR  3. Epigastric pain - Ambulatory referral to Gastroenterology  4. Pain of left lower extremity 5. Total body pain -I think stress and depression are playing a role here. I discussed using Cymbalta to help with the depression and chronic body pain. Patient is agreeable to this. Printed information given about the medication. She denies SI/HI at this time. - DULoxetine (CYMBALTA) 20 MG capsule; Take 1 capsule (20 mg total) by mouth daily.  Dispense: 30 capsule; Refill: 3  6. Anemia, unspecified type - CBC  7. Depression, unspecified depression type - Ambulatory referral to Psychiatry - DULoxetine (CYMBALTA) 20 MG capsule; Take 1 capsule (20 mg total) by mouth daily.  Dispense: 30 capsule; Refill: 3   Patient was given the opportunity to ask questions.   Patient verbalized understanding of the plan and was able to repeat key elements of the plan.   Orders Placed This Encounter  Procedures  . US Pelvis Complete  . US Transvaginal Non-OB  . CBC  . HIV antibody (with reflex)  . Hepatitis c antibody (reflex)  . RPR  . Ambulatory referral to Psychiatry  . Ambulatory referral to Gastroenterology     Requested Prescriptions   Signed Prescriptions Disp Refills  . DULoxetine (CYMBALTA) 20 MG capsule 30 capsule 3    Sig: Take 1 capsule (20 mg total) by mouth daily.    PRN Jonah Blueeborah Steward, MD, FACP  ADDENDUM:  Labs revealed a mild  stable anemia with increase MCV.  Pt has not had iron studies or folic acid/B12 level.  Will add to labs for further eval.

## 2017-04-04 NOTE — Patient Instructions (Addendum)
Start Cymbalta for depression and body pains. I have referral you for Behavioral Health services.  I have referred you for pelvic ultra sound and to see a gastroenterologist.  Duloxetine delayed-release capsules What is this medicine? DULOXETINE (doo LOX e teen) is used to treat depression, anxiety, and different types of chronic pain. This medicine may be used for other purposes; ask your health care provider or pharmacist if you have questions. COMMON BRAND NAME(S): Cymbalta, [place] What should I tell my health care provider before I take this medicine? They need to know if you have any of these conditions: -bipolar disorder or a family history of bipolar disorder -glaucoma -kidney disease -liver disease -suicidal thoughts or a previous suicide attempt -taken medicines called MAOIs like Carbex, Eldepryl, Marplan, Nardil, and Parnate within 14 days -an unusual reaction to duloxetine, other medicines, foods, dyes, or preservatives -pregnant or trying to get pregnant -breast-feeding How should I use this medicine? Take this medicine by mouth with a glass of water. Follow the directions on the prescription label. Do not cut, crush or chew this medicine. You can take this medicine with or without food. Take your medicine at regular intervals. Do not take your medicine more often than directed. Do not stop taking this medicine suddenly except upon the advice of your doctor. Stopping this medicine too quickly may cause serious side effects or your condition may worsen. A special MedGuide will be given to you by the pharmacist with each prescription and refill. Be sure to read this information carefully each time. Talk to your pediatrician regarding the use of this medicine in children. While this drug may be prescribed for children as young as 547 years of age for selected conditions, precautions do apply. Overdosage: If you think you have taken too much of this medicine contact a poison control  center or emergency room at once. NOTE: This medicine is only for you. Do not share this medicine with others. What if I miss a dose? If you miss a dose, take it as soon as you can. If it is almost time for your next dose, take only that dose. Do not take double or extra doses. What may interact with this medicine? Do not take this medicine with any of the following medications: -desvenlafaxine -levomilnacipran -linezolid -MAOIs like Carbex, Eldepryl, Marplan, Nardil, and Parnate -methylene blue (injected into a vein) -milnacipran -thioridazine -venlafaxine This medicine may also interact with the following medications: -alcohol -amphetamines -aspirin and aspirin-like medicines -certain antibiotics like ciprofloxacin and enoxacin -certain medicines for blood pressure, heart disease, irregular heart beat -certain medicines for depression, anxiety, or psychotic disturbances -certain medicines for migraine headache like almotriptan, eletriptan, frovatriptan, naratriptan, rizatriptan, sumatriptan, zolmitriptan -certain medicines that treat or prevent blood clots like warfarin, enoxaparin, and dalteparin -cimetidine -fentanyl -lithium -NSAIDS, medicines for pain and inflammation, like ibuprofen or naproxen -phentermine -procarbazine -rasagiline -sibutramine -St. John's wort -theophylline -tramadol -tryptophan This list may not describe all possible interactions. Give your health care provider a list of all the medicines, herbs, non-prescription drugs, or dietary supplements you use. Also tell them if you smoke, drink alcohol, or use illegal drugs. Some items may interact with your medicine. What should I watch for while using this medicine? Tell your doctor if your symptoms do not get better or if they get worse. Visit your doctor or health care professional for regular checks on your progress. Because it may take several weeks to see the full effects of this medicine, it is important  to continue  your treatment as prescribed by your doctor. Patients and their families should watch out for new or worsening thoughts of suicide or depression. Also watch out for sudden changes in feelings such as feeling anxious, agitated, panicky, irritable, hostile, aggressive, impulsive, severely restless, overly excited and hyperactive, or not being able to sleep. If this happens, especially at the beginning of treatment or after a change in dose, call your health care professional. [name] may get drowsy or dizzy. Do not drive, use machinery, or do anything that needs mental alertness until you know how this medicine affects you. Do not stand or sit up quickly, especially if you are an older patient. This reduces the risk of dizzy or fainting spells. Alcohol may interfere with the effect of this medicine. Avoid alcoholic drinks. This medicine can cause an increase in blood pressure. This medicine can also cause a sudden drop in your blood pressure, which may make you feel faint and increase the chance of a fall. These effects are most common when you first start the medicine or when the dose is increased, or during use of other medicines that can cause a sudden drop in blood pressure. Check with your doctor for instructions on monitoring your blood pressure while taking this medicine. Your mouth may get dry. Chewing sugarless gum or sucking hard candy, and drinking plenty of water may help. Contact your doctor if the problem does not go away or is severe. What side effects may I notice from receiving this medicine? Side effects that you should report to your doctor or health care professional as soon as possible: -allergic reactions like skin rash, itching or hives, swelling of the face, lips, or tongue -anxious -breathing problems -confusion -changes in vision -chest pain -confusion -elevated mood, decreased need for sleep, racing thoughts, impulsive behavior -eye pain -fast, irregular  heartbeat -feeling faint or lightheaded, falls -feeling agitated, angry, or irritable -hallucination, loss of contact with reality -high blood pressure -loss of balance or coordination -palpitations -redness, blistering, peeling or loosening of the skin, including inside the mouth -restlessness, pacing, inability to keep still -seizures -stiff muscles -suicidal thoughts or other mood changes -trouble passing urine or change in the amount of urine -trouble sleeping -unusual bleeding or bruising -unusually weak or tired -vomiting -yellowing of the eyes or skin Side effects that usually do not require medical attention (report to your doctor or health care professional if they continue or are bothersome): -change in sex drive or performance -change in appetite or weight -constipation -dizziness -dry mouth -headache -increased sweating -nausea -tired This list may not describe all possible side effects. Call your doctor for medical advice about side effects. You may report side effects to FDA at 1-800-FDA-1088. Where should I keep my medicine? Keep out of the reach of children. Store at room temperature between 20 and 25 degrees C (68 to 77 degrees F). Throw away any unused medicine after the expiration date. NOTE: This sheet is a summary. It may not cover all possible information. If you have questions about this medicine, talk to your doctor, pharmacist, or health care provider.  2018 Elsevier/Gold Standard (2016-03-04 18:16:03)

## 2017-04-05 LAB — CBC
Hematocrit: 30.9 % — ABNORMAL LOW (ref 34.0–46.6)
Hemoglobin: 10.2 g/dL — ABNORMAL LOW (ref 11.1–15.9)
MCH: 32.2 pg (ref 26.6–33.0)
MCHC: 33 g/dL (ref 31.5–35.7)
MCV: 98 fL — AB (ref 79–97)
PLATELETS: 263 10*3/uL (ref 150–379)
RBC: 3.17 x10E6/uL — AB (ref 3.77–5.28)
RDW: 12.5 % (ref 12.3–15.4)
WBC: 4 10*3/uL (ref 3.4–10.8)

## 2017-04-05 LAB — HEPATITIS C ANTIBODY (REFLEX): HCV Ab: <0.1 s/co ratio (ref 0.0–0.9)

## 2017-04-05 LAB — HCV COMMENT:

## 2017-04-05 LAB — HIV ANTIBODY (ROUTINE TESTING W REFLEX): HIV SCREEN 4TH GENERATION: NONREACTIVE

## 2017-04-05 LAB — RPR: RPR Ser Ql: NONREACTIVE

## 2017-04-06 ENCOUNTER — Encounter: Payer: Self-pay | Admitting: Internal Medicine

## 2017-04-06 ENCOUNTER — Other Ambulatory Visit: Payer: Self-pay | Admitting: Internal Medicine

## 2017-04-06 ENCOUNTER — Telehealth: Payer: Self-pay

## 2017-04-06 LAB — CYTOLOGY - PAP
Diagnosis: NEGATIVE
HPV (WINDOPATH): NOT DETECTED

## 2017-04-06 LAB — CERVICOVAGINAL ANCILLARY ONLY
BACTERIAL VAGINITIS: POSITIVE — AB
Candida vaginitis: NEGATIVE
Chlamydia: NEGATIVE
NEISSERIA GONORRHEA: NEGATIVE
Trichomonas: POSITIVE — AB

## 2017-04-06 MED ORDER — METRONIDAZOLE 500 MG PO TABS: 500.0000 mg | ORAL_TABLET | Freq: Two times a day (BID) | ORAL | 0 refills | Status: DC

## 2017-04-06 NOTE — Telephone Encounter (Signed)
Contacted pt to go over lab results pt is aware of results and doesn't have any questions or concerns 

## 2017-04-06 NOTE — Addendum Note (Signed)
Addended by: Jonah BlueJOHNSON, DEBORAH B on: 04/06/2017 10:34 AM   Modules accepted: Orders

## 2017-04-07 ENCOUNTER — Telehealth: Payer: Self-pay

## 2017-04-07 MED FILL — metroNIDAZOLE 500 MG TABS: 500 | 7 days supply | Qty: 14 | Fill #0

## 2017-04-07 NOTE — Telephone Encounter (Signed)
Contacted pt to go over pap results pt is aware and doesn't have any questions or concerns  

## 2017-04-08 ENCOUNTER — Ambulatory Visit (HOSPITAL_COMMUNITY)
Admission: RE | Admit: 2017-04-08 | Discharge: 2017-04-08 | Disposition: A | Discharge status: Home / Self Care | Payer: Medicaid Other | Source: Ambulatory Visit | Attending: Internal Medicine | Admitting: Internal Medicine

## 2017-04-08 DIAGNOSIS — R102 Pelvic and perineal pain: Secondary | ICD-10-CM | POA: Diagnosis present

## 2017-04-08 DIAGNOSIS — N83292 Other ovarian cyst, left side: Secondary | ICD-10-CM | POA: Diagnosis not present

## 2017-04-09 ENCOUNTER — Other Ambulatory Visit: Payer: Self-pay | Admitting: Internal Medicine

## 2017-04-09 DIAGNOSIS — E611 Iron deficiency: Secondary | ICD-10-CM

## 2017-04-09 DIAGNOSIS — N83202 Unspecified ovarian cyst, left side: Secondary | ICD-10-CM

## 2017-04-09 DIAGNOSIS — R102 Pelvic and perineal pain unspecified side: Secondary | ICD-10-CM

## 2017-04-09 LAB — IRON AND TIBC
IRON SATURATION: 12 % — AB (ref 15–55)
IRON: 40 ug/dL (ref 27–159)
TIBC: 335 ug/dL (ref 250–450)
UIBC: 295 ug/dL (ref 131–425)

## 2017-04-09 LAB — FOLATE: Folate: 6.2 ng/mL (ref >3.0)

## 2017-04-09 LAB — FERRITIN: Ferritin: 17 ng/mL (ref 15–150)

## 2017-04-09 LAB — SPECIMEN STATUS REPORT

## 2017-04-09 MED ORDER — FERROUS SULFATE 325 (65 FE) MG PO TABS: 325.0000 mg | ORAL_TABLET | Freq: Every day | ORAL | 1 refills | Status: DC

## 2017-04-11 ENCOUNTER — Telehealth: Payer: Self-pay | Admitting: Internal Medicine

## 2017-04-11 DIAGNOSIS — N83299 Other ovarian cyst, unspecified side: Secondary | ICD-10-CM

## 2017-04-11 NOTE — Telephone Encounter (Signed)
PT called to request the result for her US that she did on 04/08/17, please follow up with PT

## 2017-04-12 ENCOUNTER — Telehealth: Payer: Self-pay

## 2017-04-12 NOTE — Telephone Encounter (Signed)
Pt contacted the office to tget results for her US informed pt of US. Pt next [place] is schedule for September 11,2018 @ 1pm. Pt is to arrive arrive at 1245 with a full bladder. Pt is aware of appointment and doesn't have any questions or concerns

## 2017-04-19 NOTE — Telephone Encounter (Signed)
Pt called in this a.m and  Spoke with CMA Sundra AlandFarrington asking for medication that I was suppose to send to pharmacy to shrink the ovarian cyst seen on US.  Pt told that the plan was to repeat US in a few mths to see if it decreases in size or resolves on its own.  Pt reportedly said her mother is a Engineer, civil (consulting)nurse and she is not accepting that and that she was on her way up here. Pt then hung up.  Will refer pt to GYN.

## 2017-04-21 NOTE — Telephone Encounter (Signed)
Contacted pt 04/19/17 and made aware that Dr. Laural BenesJohnson had submitted a referral to the gyn and it will take 1-2 weeks to hear from them

## 2017-04-22 ENCOUNTER — Encounter: Payer: Self-pay | Admitting: Obstetrics & Gynecology

## 2017-04-22 ENCOUNTER — Ambulatory Visit (INDEPENDENT_AMBULATORY_CARE_PROVIDER_SITE_OTHER): Payer: Medicaid Other | Admitting: Obstetrics & Gynecology

## 2017-04-22 VITALS — BP 100/60 | HR 68 | Resp 14 | Ht 60.5 in | Wt 130.0 lb

## 2017-04-22 DIAGNOSIS — D539 Nutritional anemia, unspecified: Secondary | ICD-10-CM

## 2017-04-22 DIAGNOSIS — R1013 Epigastric pain: Secondary | ICD-10-CM

## 2017-04-22 DIAGNOSIS — N83201 Unspecified ovarian cyst, right side: Secondary | ICD-10-CM

## 2017-04-22 NOTE — Progress Notes (Addendum)
GYNECOLOGY  VISIT   HPI: 31 y.o. [phone] Married [place] American female here for discussion of recent ultrasound that was performed due to abdominal and pelvic pain that has been occurring, per pt, since her BTL was performed.  Pain is primarily under right on left side and in the midline.  She does have hx of H.pylori and was treated with appropriate antibiotics/reflux medications.  Although pain is much better, it is still not resolved.  Pain is associated with nausea that is present almost daily.  Denies emesis.  For additional evaluation, pelvic PUS was ordered as well.    Ultrasound showed 10.4 x 4.7 x 6.8 cm uterus. No fibroids noted.  Endometrium 12.51mm.  Right ovary 2.1 x 1.3 x 1.6cm.  Left ovary measured 3.1 x 2.8 x 2.7cm with septated cyst.  Possible hemorrhagic cyst noted.  (typo in note looks like cyst is 10cm but review of images show it is not and appear to be a simple cyst most consistent with functional ovarian cyst.)  These were reviewed with pt today.  Pt had additional complaints of upper body and pelvic pain that is intermittent.  Has been referral to psychiatry and started on Cymbalta 20mg  daily to see if this will help.  Pt has hx of macrocytic anemia with normal folate and normal ferritin/iron levels.  Pap neg with negative HR HPV as well as negative hep C, RPR, and HIV testing.  BV and trich were noted but treated.  GC/Chl negative.  Pt reports vaginal discharge has improved with treatment.  Moved here from [place], [place], after birth of last child.  Has post partum BTL.    GYNECOLOGIC HISTORY: Patient's last menstrual period was 04/21/2017. Contraception: Tubal Ligation  Menopausal hormone therapy: None  There are no active problems to display for this patient.   Past Medical History:  Diagnosis Date  . Anemia   . Anxiety   . Depression   . Ectopic pregnancy, tubal     Past Surgical History:  Procedure Laterality Date  . TUBAL LIGATION      Current  Outpatient Prescriptions on File Prior to Visit  Medication Sig Dispense Refill  . dicyclomine (BENTYL) 20 MG tablet Take 1 tablet (20 mg total) by mouth 2 (two) times daily. 60 tablet 0  . DULoxetine (CYMBALTA) 20 MG capsule Take 1 capsule (20 mg total) by mouth daily. 30 capsule 3  . ferrous sulfate (FERROUSUL) 325 (65 FE) MG tablet Take 1 tablet (325 mg total) by mouth daily with breakfast. (Patient not taking: Reported on 04/22/2017) 100 tablet 1   No current facility-administered medications on file prior to visit.      ALLERGIES: Citrus and Tomato  Family History  Problem Relation Age of Onset  . Endometriosis Mother   . Anemia Maternal Grandmother   . Clotting disorder Maternal Grandmother   . Thyroid disease Maternal Grandmother     SH:  Single, non smoker  Review of Systems  All other systems reviewed and are negative.   PHYSICAL EXAMINATION:    BP 100/60 (BP Location: Right Arm, Patient Position: Sitting, Cuff Size: Normal)   Pulse 68   Resp 14   Ht 5' 0.5" (1.537 m)   Wt 130 lb (59 kg)   LMP 04/21/2017   BMI 24.97 kg/m     General appearance: alert, cooperative and appears stated age Neck: no adenopathy, supple, symmetrical, trachea midline and thyroid normal to inspection and palpation CV:  Regular rate and rhythm Lungs:  clear to  auscultation, no wheezes, rales or rhonchi, symmetric air entry Breasts: normal appearance, no masses or tenderness Abdomen: soft, mid-epigastric pain to palpation; bowel sounds normal; no masses, no organomegaly  Pelvic: External genitalia:  no lesions              Urethra:  normal appearing urethra with no masses, tenderness or lesions              Bartholins and Skenes: normal                 Vagina: normal appearing vagina with normal color and discharge, no lesions              Cervix: no lesions              Bimanual Exam:  Uterus:  normal size, contour, position, consistency, mobility, non-tender              Adnexa: no  mass, fullness, tenderness              Rectovaginal: No              Anus:   no lesions  Chaperone was present for exam.  Assessment: Complex but small right ovarian cyst H/o epigastric/LUQ pain with +H. Pylori test in the past Macrocytic anemia H/O recent BV and trich that has bene treated  Plan: Pt has GI appt.  Highly encouraged her to keep this appt.  Consider follow-up pending results of evaluation.  Given symptoms, upper Gi may be indicated. Due to size of ovarian cyst, do not feel she needs repeat PUS as this should resolve without intervention Pt needs reticulocyte ct, liver function test and B12 level for additional evaluation of anemia.  If normal, may need to consider hematology consultation   Lengthy visit with pt that was in excess of one hour with >50% of this time in face to face discussion.  Pt had many questions.  She had a friend who accompanied her but stepped in an out of the room several times to take phone calls.  This increased time of visit as explanations given when friend was not present were then requested to be repeated.  Initially at visit, pt had speaker phone on with her mother present (but did not inform me of this).  Pt discontinued phone call with mother when sound quality was not good.

## 2017-04-26 DIAGNOSIS — N83201 Unspecified ovarian cyst, right side: Secondary | ICD-10-CM | POA: Insufficient documentation

## 2017-04-26 DIAGNOSIS — R1013 Epigastric pain: Secondary | ICD-10-CM | POA: Insufficient documentation

## 2017-04-26 DIAGNOSIS — D539 Nutritional anemia, unspecified: Secondary | ICD-10-CM | POA: Insufficient documentation

## 2017-04-27 ENCOUNTER — Other Ambulatory Visit: Payer: Self-pay | Admitting: *Deleted

## 2017-04-27 DIAGNOSIS — D539 Nutritional anemia, unspecified: Secondary | ICD-10-CM

## 2017-04-27 NOTE — Progress Notes (Signed)
Jennifer Combs, Jennifer S, MD  Jennifer Combs, Jennifer Combs, CMA        [place],  Can you call this pt from Friday? She was the young african [place] female accompanied by the very tall friend.   Pt has macrocytic anemia (cells are big) and I reviewed all of her blood work. She has not had a complete evlauation for this. She needs a B12, liver profile, and reticulocyte count. Can you see if she will return for these? No copay needed as I should have drawn them when she was here. Orders have been placed. Thanks.   Rosalita ChessmanSuzanne    Orders placed.

## 2017-04-28 ENCOUNTER — Other Ambulatory Visit: Payer: Medicaid Other

## 2017-04-28 ENCOUNTER — Other Ambulatory Visit: Payer: Self-pay

## 2017-04-28 DIAGNOSIS — D539 Nutritional anemia, unspecified: Secondary | ICD-10-CM

## 2017-04-29 ENCOUNTER — Other Ambulatory Visit: Payer: Medicaid Other

## 2017-05-01 ENCOUNTER — Telehealth: Payer: Self-pay | Admitting: Internal Medicine

## 2017-05-01 NOTE — Telephone Encounter (Signed)
Will cancel future pelvic U/S. Pt seen by GYN. Repeat US not needed per GYN.

## 2017-05-02 ENCOUNTER — Other Ambulatory Visit: Payer: Medicaid Other

## 2017-05-02 ENCOUNTER — Telehealth: Payer: Self-pay | Admitting: Internal Medicine

## 2017-05-02 ENCOUNTER — Ambulatory Visit: Payer: Medicaid Other | Admitting: Internal Medicine

## 2017-05-02 DIAGNOSIS — D539 Nutritional anemia, unspecified: Secondary | ICD-10-CM

## 2017-05-02 NOTE — Telephone Encounter (Signed)
No

## 2017-05-02 NOTE — Telephone Encounter (Signed)
Dr. Marina GoodellPerry would  You like to charge this patient?

## 2017-05-03 LAB — RETICULOCYTES: RETIC CT PCT: 1.3 % (ref 0.6–2.6)

## 2017-05-03 LAB — VITAMIN B12: VITAMIN B 12: 345 pg/mL (ref 232–1245)

## 2017-05-03 LAB — HEPATIC FUNCTION PANEL
ALBUMIN: 4.3 g/dL (ref 3.5–5.5)
ALK PHOS: 51 IU/L (ref 39–117)
ALT: 10 IU/L (ref 0–32)
AST: 20 IU/L (ref 0–40)
BILIRUBIN, DIRECT: 0.12 mg/dL (ref 0.00–0.40)
Bilirubin Total: 0.4 mg/dL (ref 0.0–1.2)
TOTAL PROTEIN: 6.6 g/dL (ref 6.0–8.5)

## 2017-05-05 ENCOUNTER — Telehealth: Payer: Self-pay

## 2017-05-05 DIAGNOSIS — D649 Anemia, unspecified: Secondary | ICD-10-CM

## 2017-05-05 NOTE — Telephone Encounter (Signed)
-----   Message from Romualdo BolkJill Evelyn Jertson, MD sent at 05/03/2017  7:00 PM EDT ----- Please advise the patient of normal results. Please refer her to hematology for a consultation. 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

## 2017-05-05 NOTE — Telephone Encounter (Signed)
Spoke with patient. Advised of results and message as seen below from Dr.Jertson. Patient is agreeable for referral to hematology.   Dr.Jertson, please advise dx for referral.

## 2017-05-06 NOTE — Telephone Encounter (Signed)
Hematology referral for anemia

## 2017-05-06 NOTE — Telephone Encounter (Signed)
Referral placed.  Routing to Braxton Feathersebecca Frahm for referral scheduling.  Cc: Dr.Miller  Routing to covering provider for final review. Patient agreeable to disposition. Will close encounter.

## 2017-05-07 ENCOUNTER — Encounter: Payer: Self-pay | Admitting: Internal Medicine

## 2017-05-09 NOTE — Telephone Encounter (Signed)
Yes it was cancelled 

## 2017-05-12 ENCOUNTER — Telehealth: Payer: Self-pay | Admitting: Obstetrics & Gynecology

## 2017-05-12 NOTE — Telephone Encounter (Signed)
Reviewed with Dr. Hyacinth MeekerMiller -review ER precautions with patient, schedule for OV tomorrow.    Spoke with patient, scheduled for OV with Dr. Hyacinth MeekerMiller on 7/27 at 11:30 am. Advised patient should pain or symptoms worsen or fever develops -seek care at local ER/Urgent care.  Patient verbalizes understanding and is agreeable to date and time.  Routing to provider for final review. Patient is agreeable to disposition. Will close encounter.

## 2017-05-12 NOTE — Telephone Encounter (Signed)
Patient states she is having pain in her uterus and lumps in her groin area.  Says she has not been able to get out of bed al day long because of the pain she is having.  Wondering if Dr Hyacinth MeekerMiller can give her something for the pain.

## 2017-05-12 NOTE — Telephone Encounter (Signed)
Spoke with patient. Reports "discomfort in bottom of stomach" and warm, swollen "lymph nodes" in groin. Started at 10am this morning, has not taken anything for pain, can barely walk. Reports nausea; denies fever/chills, urinary symptoms, vaginal discharge/odor. LMP 04/24/17, tubal for contraceptive. Denies any partner changes or STD concerns.   Advised patient would review with Dr. Hyacinth MeekerMiller and return call with recommendations, patient is agreeable.

## 2017-05-13 ENCOUNTER — Ambulatory Visit (INDEPENDENT_AMBULATORY_CARE_PROVIDER_SITE_OTHER): Payer: Medicaid Other | Admitting: Obstetrics & Gynecology

## 2017-05-13 VITALS — BP 96/66 | HR 80 | Temp 98.3°F | Resp 16 | Ht 60.5 in | Wt 132.0 lb

## 2017-05-13 DIAGNOSIS — R140 Abdominal distension (gaseous): Secondary | ICD-10-CM

## 2017-05-13 DIAGNOSIS — K909 Intestinal malabsorption, unspecified: Secondary | ICD-10-CM

## 2017-05-13 DIAGNOSIS — R110 Nausea: Secondary | ICD-10-CM

## 2017-05-13 DIAGNOSIS — R197 Diarrhea, unspecified: Secondary | ICD-10-CM | POA: Diagnosis not present

## 2017-05-13 DIAGNOSIS — R1084 Generalized abdominal pain: Secondary | ICD-10-CM | POA: Diagnosis not present

## 2017-05-13 NOTE — Progress Notes (Signed)
GYNECOLOGY  VISIT   HPI: 31 y.o. [id number] Married [place] American female here for pelvic pain and abdominal bloating.  This has been an on-going problem for the pt.  Reports the pain started yesterday afternoon and was associated with significant abdominal bloating.  She had associated nausea as well.  Her abdomen became distended and she looked pregnant.  Then after awhile, she had a bout of significant diarrhea that really helped the bloating and pain.  She is still having some pain now but it is much better.  She has been prescribed Bentyl and this helps with the pain when she is experiencing it.    She has been experiencing these "bouts" for at least the past two years.  Pt associates these issues with the birth of her last child and a BTL that was done at the same time.  With each episode, she will have significant bloating, nausea (but never emesis), and significant pain.  Often she will later have diarrhea which will help to relieve symptoms.  She is not seeking pain medications and does not want narcotics.  She just wants to get to the bottom of this.    I saw her as a new patient on 04/22/17.  She was referred for enlarged ovarian cyst but this was a typo on the report.  When I reviewed films, cyst was not 10cm as reported and appears to just be a functional ovarian cyst.  This will not explain her intermittent pain with bloating.  She has been diagnosed with H. Pylori and did received treatment for this.  Pt is unsure of exact medications but she describes correct therapy by number of medications and days she took it.  Upon asking, pt has discovered that certain things are "triggers" for her abdominal symptoms--bread, red meat, tomatoes specifically.  She is doing a fair amount of food restricting.  She has lost weight.  Also, upon asking, she reports her maternal grandmother could not each bread.  Her mother does not think she was ever diagnosed with celiac disease but pt states she always eats  gluten free.  Pt showed me a picture from last night (that she took with her phone) where she had the increased pain that was associated with nausea and bloating.  She then had diarrhea and the symptoms improved and bloating subsided.  I took a picture of the picture and attached it below with a picture from today for comparison.        GYNECOLOGIC HISTORY: Patient's last menstrual period was 04/21/2017. Contraception: tubal ligation Menopausal hormone therapy: none  Patient Active Problem List   Diagnosis Date Noted  . Epigastric pain 04/26/2017  . Cyst of right ovary 04/26/2017  . Macrocytic anemia 04/26/2017  . Herpes genitalia 04/08/2011    Past Medical History:  Diagnosis Date  . Anemia   . Anxiety   . Depression   . Ectopic pregnancy, tubal     Past Surgical History:  Procedure Laterality Date  . TUBAL LIGATION      MEDS:  Reviewed in EPIC and UTD  ALLERGIES: Citrus and Tomato  Family History  Problem Relation Age of Onset  . Endometriosis Mother   . Anemia Maternal Grandmother   . Clotting disorder Maternal Grandmother   . Thyroid disease Maternal Grandmother     SH:  smoker  Review of Systems  Gastrointestinal: Positive for nausea.       Bloating  Genitourinary:       Pain/ bleeding with intercourse Loss  of sexual interest  Musculoskeletal: Positive for joint pain and myalgias.       Muscle weakness  All other systems reviewed and are negative.   PHYSICAL EXAMINATION:    BP 96/66 (BP Location: Right Arm, Patient Position: Sitting, Cuff Size: Normal)   Pulse 80   Temp 98.3 F (36.8 C) (Oral)   Resp 16   Ht 5' 0.5" (1.537 m)   Wt 132 lb (59.9 kg)   LMP 04/21/2017   BMI 25.36 kg/m     General appearance: alert, cooperative and appears stated age CV:  Regular rate and rhythm Lungs:  clear to auscultation, no wheezes, rales or rhonchi, symmetric air entry Abdomen: mild tenderness in all quadrants, no distension, bowel sounds normal; no  masses,  no organomegaly, no rebound or guarding  Pelvic: External genitalia:  no lesions              Urethra:  normal appearing urethra with no masses, tenderness or lesions              Bartholins and Skenes: normal                 Vagina: normal appearing vagina with normal color and discharge, no lesions              Cervix: no lesions              Bimanual Exam:  Uterus:  normal size, contour, position, consistency, mobility, non-tender              Adnexa: no mass, fullness, tenderness              Anus:  no lesions  Chaperone was present for exam.  Assessment: Pelvic/abdominal pain Episodic pain with significant bloating (as in picture) with associated nausea, then diarrhea which helps alleviate symptoms Prior hx of H pylori Malabsorptive d/o vs inflammatory bowel disease??  Plan: I really think she needs GI evaluation.  She missed her appt due to transportation issues.  (Her mother came from [place] to take her to the appt as she does not have her license but her mother arrived an hour late.)  Appt has been rescheduled into September.  Will call to see if this can be moved up. Transglutaminase IGA obtained today If GI evaluation is negative, I will consider proceeding with laparoscopy to r/o endometriosis but presentation is more consistent with GI than GYN.  Hopefully, GI evaluation will help shed some light into how to improve this for her.   ~30 minutes spent with patient >50% of time was in face to face discussion of above.

## 2017-05-13 NOTE — Progress Notes (Signed)
Call to [facility]. Appointment moved to 05/18/2017 with Dr.Pyrtle at 9:30 am. This is first available appointment. Patient is agreeable to date and time.

## 2017-05-14 ENCOUNTER — Encounter: Payer: Self-pay | Admitting: Obstetrics & Gynecology

## 2017-05-15 LAB — TISSUE TRANSGLUTAMINASE, IGA

## 2017-05-17 ENCOUNTER — Encounter: Payer: Self-pay | Admitting: *Deleted

## 2017-05-18 ENCOUNTER — Other Ambulatory Visit (HOSPITAL_BASED_OUTPATIENT_CLINIC_OR_DEPARTMENT_OTHER): Payer: Medicaid Other

## 2017-05-18 ENCOUNTER — Other Ambulatory Visit: Payer: Self-pay | Admitting: Family

## 2017-05-18 ENCOUNTER — Ambulatory Visit: Payer: Medicaid Other

## 2017-05-18 ENCOUNTER — Ambulatory Visit (HOSPITAL_BASED_OUTPATIENT_CLINIC_OR_DEPARTMENT_OTHER): Payer: Medicaid Other | Admitting: Family

## 2017-05-18 ENCOUNTER — Encounter: Payer: Self-pay | Admitting: Internal Medicine

## 2017-05-18 ENCOUNTER — Ambulatory Visit (INDEPENDENT_AMBULATORY_CARE_PROVIDER_SITE_OTHER): Payer: Medicaid Other | Admitting: Internal Medicine

## 2017-05-18 VITALS — BP 103/45 | HR 56 | Temp 99.0°F | Resp 16 | Ht 59.0 in | Wt 133.0 lb

## 2017-05-18 VITALS — BP 84/60 | HR 68 | Ht 59.5 in | Wt 132.5 lb

## 2017-05-18 DIAGNOSIS — D539 Nutritional anemia, unspecified: Secondary | ICD-10-CM

## 2017-05-18 DIAGNOSIS — N920 Excessive and frequent menstruation with regular cycle: Secondary | ICD-10-CM | POA: Diagnosis not present

## 2017-05-18 DIAGNOSIS — R195 Other fecal abnormalities: Secondary | ICD-10-CM

## 2017-05-18 DIAGNOSIS — R1084 Generalized abdominal pain: Secondary | ICD-10-CM | POA: Diagnosis not present

## 2017-05-18 DIAGNOSIS — R110 Nausea: Secondary | ICD-10-CM | POA: Diagnosis not present

## 2017-05-18 DIAGNOSIS — K922 Gastrointestinal hemorrhage, unspecified: Secondary | ICD-10-CM | POA: Diagnosis not present

## 2017-05-18 DIAGNOSIS — D500 Iron deficiency anemia secondary to blood loss (chronic): Secondary | ICD-10-CM | POA: Diagnosis present

## 2017-05-18 DIAGNOSIS — R140 Abdominal distension (gaseous): Secondary | ICD-10-CM | POA: Diagnosis not present

## 2017-05-18 DIAGNOSIS — Z8619 Personal history of other infectious and parasitic diseases: Secondary | ICD-10-CM

## 2017-05-18 LAB — COMPREHENSIVE METABOLIC PANEL (CC13)
ALBUMIN: 4.1 g/dL (ref 3.5–5.5)
ALK PHOS: 56 IU/L (ref 39–117)
ALT: 10 IU/L (ref 0–32)
AST (SGOT): 17 IU/L (ref 0–40)
Albumin/Globulin Ratio: 1.4 (ref 1.2–2.2)
BILIRUBIN TOTAL: 0.3 mg/dL (ref 0.0–1.2)
BUN / CREAT RATIO: 16 (ref 9–23)
BUN: 12 mg/dL (ref 6–20)
CO2: 26 mmol/L (ref 20–29)
CREATININE: 0.75 mg/dL (ref 0.57–1.00)
Calcium, Ser: 11.7 mg/dL — ABNORMAL HIGH (ref 8.7–10.2)
Chloride, Ser: 106 mmol/L (ref 96–106)
GFR calc Af Amer: 125 mL/min/{1.73_m2} (ref >59)
GFR calc non Af Amer: 108 mL/min/{1.73_m2} (ref >59)
GLOBULIN, TOTAL: 2.9 g/dL (ref 1.5–4.5)
Glucose: 91 mg/dL (ref 65–99)
Potassium, Ser: 3.6 mmol/L (ref 3.5–5.2)
SODIUM: 141 mmol/L (ref 134–144)
Total Protein: 7 g/dL (ref 6.0–8.5)

## 2017-05-18 LAB — CBC WITH DIFFERENTIAL (CANCER CENTER ONLY)
BASO#: 0 10*3/uL (ref 0.0–0.2)
BASO%: 0.2 % (ref 0.0–2.0)
EOS%: 1.7 % (ref 0.0–7.0)
Eosinophils Absolute: 0.1 10*3/uL (ref 0.0–0.5)
HEMATOCRIT: 29.5 % — AB (ref 34.8–46.6)
HEMOGLOBIN: 9.9 g/dL — AB (ref 11.6–15.9)
LYMPH#: 1.9 10*3/uL (ref 0.9–3.3)
LYMPH%: 36.8 % (ref 14.0–48.0)
MCH: 33.6 pg (ref 26.0–34.0)
MCHC: 33.6 g/dL (ref 32.0–36.0)
MCV: 100 fL (ref 81–101)
MONO#: 0.3 10*3/uL (ref 0.1–0.9)
MONO%: 5.2 % (ref 0.0–13.0)
NEUT%: 56.1 % (ref 39.6–80.0)
NEUTROS ABS: 2.9 10*3/uL (ref 1.5–6.5)
Platelets: 196 10*3/uL (ref 145–400)
RBC: 2.95 10*6/uL — ABNORMAL LOW (ref 3.70–5.32)
RDW: 11.6 % (ref 11.1–15.7)
WBC: 5.2 10*3/uL (ref 3.9–10.0)

## 2017-05-18 LAB — CHCC SATELLITE - SMEAR

## 2017-05-18 MED ORDER — SUPREP BOWEL PREP KIT 17.5-3.13-1.6 GM/177ML PO SOLN: 1.0000 | ORAL | 0 refills | Status: DC

## 2017-05-18 MED ORDER — FOLIC ACID 1 MG PO TABS: 1.0000 mg | ORAL_TABLET | Freq: Every day | ORAL | 4 refills | Status: DC

## 2017-05-18 NOTE — Progress Notes (Signed)
Hematology/Oncology Consultation   Name: Jennifer Combs      MRN: [id number]    Location: Room/bed info not found  Date: 05/18/2017 Time:1:51 PM   REFERRING PHYSICIAN: Megan Salon, MD  REASON FOR CONSULT: Anemia   DIAGNOSIS: Iron deficiency anemia secondary to menorrhagia   HISTORY OF PRESENT ILLNESS: Jennifer Combs is a very pleasant 31 yo African American female with iron deficiency anemia secondary to heavy cycles. These are regular and last 3-5 days with some clots.  Hgb is 9.9 with an MCV of 100. Iron studies for today are pending.  She is symptomatic with fatigue, weakness, chills, occasional SOB with over exertion, chewing pen tops (teeth sensitive to ice), nausea without vomiting and bruising easily. She has noticed that she has numbness and tingling in her hands and feet that comes and goes.  No fever, cough, rash, dizziness, chest pain, palpitations, abdominal pain or changes in  bladder habits.  She has diarrhea off and on and has had quite a bit of bloating. She has an appointment with GI in the next few weeks and has scheduled an endoscopy and colonoscopy for later this month.  She states that her GI symptoms started after she had her tubes ties 4 years ago.  She has no appetite and states that she does not eat healthy. She is staying hydrated.  She states that she has lost 40 lbs since 2016.  She has 4 children and states that she miscarried twins in September 2010 and an elective abortion in December 2010.  Her eldest son has the sickle cell trait as do several family members on her mother's side.  She has joint aches and pains and lower back pain that comes and goes.  No personal history of cancer. Her maternal grandmother had myelodysplasia.   She is out of work at this time due to her symptoms.   ROS: All other 10 point review of systems is negative.   PAST MEDICAL HISTORY:   Past Medical History:  Diagnosis Date  . Anemia   . Anxiety   . Depression   . Ectopic  pregnancy, tubal   . H. pylori infection   . Herpes genitalia   . IBS (irritable bowel syndrome)   . Ovarian cyst   . Trichomonas infection     ALLERGIES: Allergies  Allergen Reactions  . Citrus   . Tomato     MAKES MOUTH RAW.      MEDICATIONS:  Current Outpatient Prescriptions on File Prior to Visit  Medication Sig Dispense Refill  . dicyclomine (BENTYL) 20 MG tablet Take 1 tablet (20 mg total) by mouth 2 (two) times daily. 60 tablet 0  . DULoxetine (CYMBALTA) 20 MG capsule Take 1 capsule (20 mg total) by mouth daily. 30 capsule 3  . ferrous sulfate (FERROUSUL) 325 (65 FE) MG tablet Take 1 tablet (325 mg total) by mouth daily with breakfast. (Patient not taking: Reported on 05/18/2017) 100 tablet 1  . SUPREP BOWEL PREP KIT 17.5-3.13-1.6 GM/180ML SOLN Take 1 kit by mouth as directed. 354 mL 0   No current facility-administered medications on file prior to visit.      PAST SURGICAL HISTORY Past Surgical History:  Procedure Laterality Date  . TUBAL LIGATION      FAMILY HISTORY: Family History  Problem Relation Age of Onset  . Endometriosis Mother   . Anemia Maternal Grandmother   . Clotting disorder Maternal Grandmother   . Thyroid disease Maternal Grandmother     SOCIAL  HISTORY:  reports that she has been smoking.  She has been smoking about 0.25 packs per day. She has never used smokeless tobacco. She reports that she does not drink alcohol or use drugs.  PERFORMANCE STATUS: The patient's performance status is 1 - Symptomatic but completely ambulatory  PHYSICAL EXAM: Most Recent Vital Signs: Blood pressure (!) 103/45, pulse (!) 56, temperature 99 F (37.2 C), temperature source Oral, resp. rate 16, height '4\' 11"'  (1.499 m), weight 133 lb (60.3 kg), last menstrual period 04/21/2017, SpO2 100 %. BP (!) 103/45 (BP Location: Left Arm, Patient Position: Sitting)   Pulse (!) 56   Temp 99 F (37.2 C) (Oral)   Resp 16   Ht '4\' 11"'  (1.499 m)   Wt 133 lb (60.3 kg)   LMP  04/21/2017   SpO2 100%   BMI 26.86 kg/m   General Appearance:    Alert, cooperative, no distress, appears stated age  Head:    Normocephalic, without obvious abnormality, atraumatic  Eyes:    PERRL, conjunctiva/corneas clear, EOM's intact, fundi    benign, both eyes        Throat:   Lips, mucosa, and tongue normal; teeth and gums normal  Neck:   Supple, symmetrical, trachea midline, no adenopathy;    thyroid:  no enlargement/tenderness/nodules; no carotid   bruit or JVD  Back:     Symmetric, no curvature, ROM normal, no CVA tenderness  Lungs:     Clear to auscultation bilaterally, respirations unlabored  Chest Wall:    No tenderness or deformity   Heart:    Regular rate and rhythm, S1 and S2 normal, no murmur, rub   or gallop     Abdomen:     Soft, non-tender, bowel sounds active all four quadrants,    no masses, no organomegaly        Extremities:   Extremities normal, atraumatic, no cyanosis or edema  Pulses:   2+ and symmetric all extremities  Skin:   Skin color, texture, turgor normal, no rashes or lesions  Lymph nodes:   Cervical, supraclavicular, and axillary nodes normal  Neurologic:   CNII-XII intact, normal strength, sensation and reflexes    throughout    LABORATORY DATA:  Results for orders placed or performed in visit on 05/18/17 (from the past 48 hour(s))  CBC w/Diff     Status: Abnormal   Collection Time: 05/18/17  1:10 PM  Result Value Ref Range   WBC 5.2 3.9 - 10.0 10e3/uL   RBC 2.95 (L) 3.70 - 5.32 10e6/uL   HGB 9.9 (L) 11.6 - 15.9 g/dL   HCT 29.5 (L) 34.8 - 46.6 %   MCV 100 81 - 101 fL   MCH 33.6 26.0 - 34.0 pg   MCHC 33.6 32.0 - 36.0 g/dL   RDW 11.6 11.1 - 15.7 %   Platelets 196 145 - 400 10e3/uL   NEUT# 2.9 1.5 - 6.5 10e3/uL   LYMPH# 1.9 0.9 - 3.3 10e3/uL   MONO# 0.3 0.1 - 0.9 10e3/uL   Eosinophils Absolute 0.1 0.0 - 0.5 10e3/uL   BASO# 0.0 0.0 - 0.2 10e3/uL   NEUT% 56.1 39.6 - 80.0 %   LYMPH% 36.8 14.0 - 48.0 %   MONO% 5.2 0.0 - 13.0 %   EOS%  1.7 0.0 - 7.0 %   BASO% 0.2 0.0 - 2.0 %  Smear     Status: None   Collection Time: 05/18/17  1:10 PM  Result Value Ref Range   Smear  Result Smear Available       RADIOGRAPHY: No results found.     PATHOLOGY: None   ASSESSMENT/PLAN: Jennifer Combs is a very pleasant 31 yo African American female with iron deficiency anemia secondary to heavy cycles. Iron saturation is 9% with a ferritin of 37. She will get IV iron tomorrow and again in 1 week.  Hemoglobinopathy eval is pending. We will go ahead and get her on folic acid 1 mg PO daily.  We will plan to see her back again in 6 weeks for repeat lab work and follow-up.   All questions were answered and she is in agreement with the plan. She will contact our office with any questions or concerns. We can certainly see her much sooner if necessary.  She was discussed with and also seen by Dr. Marin Olp and he is in agreement with the aforementioned.   [facility] M     Addendum:  I saw and examined the patient with Sarah. I agree with the above assessment.  I looked at her blood under the microscope. I do not see anything that looked suspicious. She has microcytic red blood cells. Given her iron studies, I suspected that she has iron deficiency.  She has a normal hemoglobin electrophoresis. As such, I don't see any evidence of sickle cell disease.  Her erythropoietin level is not that high. On surprised by this. We may have to be careful with this.  We will go ahead and see a she has with iron.  I agree with the folic acid.  We spent about 40 minutes with her. We answered her questions.  Lattie Haw, MD

## 2017-05-18 NOTE — Patient Instructions (Signed)
You have been scheduled for an endoscopy and colonoscopy. Please follow the written instructions given to you at your visit today. Please pick up your prep supplies at the pharmacy within the next 1-3 days. If you use inhalers (even only as needed), please bring them with you on the day of your procedure. Your physician has requested that you go to www.startemmi.com and enter the access code given to you at your visit today. This web site gives a general overview about your procedure. However, you should still follow specific instructions given to you by our office regarding your preparation for the procedure.  You have been referred for a small intestine bacterial overgrowth test (SIBO) which is completed by a company named [facility]. Your demographic and insurance information has been sent to this company and they should be in contact with you over the next week regarding this test. Please keep in mind that you will be getting this call from phone number [phone] or a [phone] number. If you do not hear from them within this time frame, please call our office at [phone].

## 2017-05-18 NOTE — Progress Notes (Signed)
Patient ID: Estelle GrumblesShantee R Combs, female   DOB: 03/17/1986, 31 y.o.   MRN: [id number] HPI: Jennifer Combs is a 31 year old female with a past medical history of anxiety, depression, anemia, history of H. pylori infection who is seen in consultation at the request of Jonah Blueeborah Schmader, M.D. to evaluate abdominal pain, bloating and loose stools. She is here with her mother today. [place] from [place] for this appointment.  She reports that for several years as many as 4 but worse over the last 2 years having a mid and upper burning abdominal pain. She reports her abdomen will feel like it is "on fire". It is worse with eating. It hurts to eat and she often avoids eating. She notices severe abdominal bloating such that she feels "9 months pregnant". She brings a picture of her abdominal bloating today. Bloating is worse after eating. She reports a 40 pound weight loss in the last 2 years. She reports when her stomach is hurting she often has loose stools or even diarrhea. She will take hot showers to try to help with her abdominal discomfort. She estimates 5 days out of 7 she will have loose stools. She denies seeing blood in her stool or melena. She eats a very limited diet and tries to avoid fried foods. She states she eats a lot of baked chicken or chicken salad. She also eats shrimp egg rolls and potato chips. She reports at times feeling like she has to "force the need" herself. She was given a prescription for Bentyl which she uses 20 mg twice daily as needed. She uses this only when the pain is most severe but is found to be very helpful. She does report nausea with out vomiting.  She was diagnosed with H. pylori by breath test in March 2018. She reports completing the antibiotics. They did not seem to improve any of her symptoms.  She has been tested for celiac disease by celiac panel and this was negative. She states her grandmother had gluten sensitivity.  She has been prescribed oral iron for  anemia but has not started this. She has an appointment with hematology.  She does admit to significant stress in her life. She has separated from her husband. She also has 4 children and is involved in caring for her father. Stress makes her abdominal symptoms worse. She was tearful during our discussion of her stress levels today.  She denies a family history of GI tract malignancy and IBD. The patient denies alcohol use though she does smoke cigarettes.  Past Medical History:  Diagnosis Date  . Anemia   . Anxiety   . Depression   . Ectopic pregnancy, tubal   . H. pylori infection   . Herpes genitalia   . IBS (irritable bowel syndrome)   . Ovarian cyst   . Trichomonas infection     Past Surgical History:  Procedure Laterality Date  . TUBAL LIGATION      Outpatient Medications Prior to Visit  Medication Sig Dispense Refill  . dicyclomine (BENTYL) 20 MG tablet Take 1 tablet (20 mg total) by mouth 2 (two) times daily. 60 tablet 0  . DULoxetine (CYMBALTA) 20 MG capsule Take 1 capsule (20 mg total) by mouth daily. 30 capsule 3  . ferrous sulfate (FERROUSUL) 325 (65 FE) MG tablet Take 1 tablet (325 mg total) by mouth daily with breakfast. (Patient not taking: Reported on 05/18/2017) 100 tablet 1   No facility-administered medications prior to visit.  Allergies  Allergen Reactions  . Citrus   . Tomato     MAKES MOUTH RAW.    Family History  Problem Relation Age of Onset  . Endometriosis Mother   . Anemia Maternal Grandmother   . Clotting disorder Maternal Grandmother   . Thyroid disease Maternal Grandmother     Social History  Substance Use Topics  . Smoking status: Current Every Day Smoker    Packs/day: 0.25  . Smokeless tobacco: Never Used  . Alcohol use No    ROS: As per history of present illness, otherwise negative  BP (!) 84/60   Pulse 68   Ht 4' 11.5" (1.511 m)   Wt 132 lb 8 oz (60.1 kg)   LMP 04/21/2017   BMI 26.31 kg/m  Constitutional:  Well-developed and well-nourished. No distress. HEENT: Normocephalic and atraumatic. Oropharynx is clear and moist. Conjunctivae are normal.  No scleral icterus. Neck: Neck supple. Trachea midline. Cardiovascular: Normal rate, regular rhythm and intact distal pulses. No M/R/G Pulmonary/chest: Effort normal and breath sounds normal. No wheezing, rales or rhonchi. Abdominal: Soft, Diffuse abdominal tenderness without rebound or guarding nondistended. Bowel sounds active throughout. There are no masses palpable. No hepatosplenomegaly. Extremities: no clubbing, cyanosis, or edema Neurological: Alert and oriented to person place and time. Skin: Skin is warm and dry.  Psychiatric: Normal mood and affect. Behavior is normal.  RELEVANT LABS AND IMAGING: CBC    Component Value Date/Time   WBC 4.0 04/04/2017 1651   WBC 3.8 (L) 09/28/2016 1040   RBC 3.17 (L) 04/04/2017 1651   RBC 3.06 (L) 09/28/2016 1040   HGB 10.2 (L) 04/04/2017 1651   HCT 30.9 (L) 04/04/2017 1651   PLT 263 04/04/2017 1651   MCV 98 (H) 04/04/2017 1651   MCH 32.2 04/04/2017 1651   MCH 32.0 09/28/2016 1040   MCHC 33.0 04/04/2017 1651   MCHC 33.0 09/28/2016 1040   RDW 12.5 04/04/2017 1651   LYMPHSABS 1.6 02/05/2015 1253   MONOABS 0.2 02/05/2015 1253   EOSABS 0.1 02/05/2015 1253   BASOSABS 0.0 02/05/2015 1253    CMP     Component Value Date/Time   NA 139 12/31/2016 1102   K 3.9 12/31/2016 1102   CL 104 12/31/2016 1102   CO2 27 09/28/2016 1040   GLUCOSE 88 12/31/2016 1102   BUN 9 12/31/2016 1102   CREATININE 0.80 12/31/2016 1102   CALCIUM 9.2 09/28/2016 1040   PROT 6.6 05/02/2017 1330   ALBUMIN 4.3 05/02/2017 1330   AST 20 05/02/2017 1330   ALT 10 05/02/2017 1330   ALKPHOS 51 05/02/2017 1330   BILITOT 0.4 05/02/2017 1330   GFRNONAA >60 09/28/2016 1040   GFRAA >60 09/28/2016 1040   TTG neg Hep C Ab neg B12 normal RPR negative HIV antibody negative  Iron/TIBC/Ferritin/ %Sat    Component Value Date/Time    IRON 40 04/04/2017 1647   TIBC 335 04/04/2017 1647   FERRITIN 17 04/04/2017 1647   IRONPCTSAT 12 (L) 04/04/2017 1647     ASSESSMENT/PLAN: 31 year old female with a past medical history of anxiety, depression, anemia, history of H. pylori infection who is seen in consultation at the request of Jonah Blueeborah Stopka, M.D. to evaluate abdominal pain, bloating and loose stools  1. Abdominal pain/abdominal bloating/nausea/loose stools/history of H. Pylori -- she has symptoms which could be that of irritable bowel syndrome however she has lost 40 pounds which is atypical for irritable bowel alone. She struggles with daily abdominal pain, nausea and frequent loose stools. Her bloating  by the picture she showed me is significant. I recommended upper endoscopy and colonoscopy for further evaluation of her symptoms and to exclude H. pylori, ulcer disease, inflammatory bowel disease. We discussed the risks, benefits and alternatives and she is agreeable and wishes to proceed. I will also have her complete hydrogen breath testing to exclude SIBO. For now she will continue Bentyl but can use this 20 mg 3 times a day when necessary. Prescription provided for Zofran 4 mg every 6 hours when necessary nausea      LK:GMWNUUV, Binnie Rail, Md [address] [place], [place] [address]

## 2017-05-19 ENCOUNTER — Other Ambulatory Visit: Payer: Self-pay | Admitting: Family

## 2017-05-19 ENCOUNTER — Ambulatory Visit (HOSPITAL_BASED_OUTPATIENT_CLINIC_OR_DEPARTMENT_OTHER): Payer: Medicaid Other

## 2017-05-19 VITALS — BP 100/49 | HR 59 | Temp 98.5°F | Resp 20

## 2017-05-19 DIAGNOSIS — D500 Iron deficiency anemia secondary to blood loss (chronic): Secondary | ICD-10-CM | POA: Diagnosis present

## 2017-05-19 DIAGNOSIS — N920 Excessive and frequent menstruation with regular cycle: Secondary | ICD-10-CM | POA: Diagnosis not present

## 2017-05-19 DIAGNOSIS — D509 Iron deficiency anemia, unspecified: Secondary | ICD-10-CM | POA: Insufficient documentation

## 2017-05-19 DIAGNOSIS — D539 Nutritional anemia, unspecified: Secondary | ICD-10-CM

## 2017-05-19 LAB — FERRITIN: Ferritin: 37 ng/ml (ref 9–269)

## 2017-05-19 LAB — ERYTHROPOIETIN: Erythropoietin: 10.6 m[IU]/mL (ref 2.6–18.5)

## 2017-05-19 LAB — IRON AND TIBC
%SAT: 9 % — AB (ref 21–57)
IRON: 30 ug/dL — AB (ref 41–142)
TIBC: 328 ug/dL (ref 236–444)
UIBC: 298 ug/dL (ref 120–384)

## 2017-05-19 LAB — RETICULOCYTES: RETICULOCYTE COUNT: 1 % (ref 0.6–2.6)

## 2017-05-19 MED ORDER — SODIUM CHLORIDE 0.9 % IV SOLN
Freq: Once | INTRAVENOUS | Status: AC
  Administered 2017-05-19: 14:00:00 via INTRAVENOUS

## 2017-05-19 MED ORDER — SODIUM CHLORIDE 0.9 % IV SOLN
510.0000 mg | Freq: Once | INTRAVENOUS | Status: AC
  Administered 2017-05-19: 510 mg via INTRAVENOUS
  Filled 2017-05-19: qty 17

## 2017-05-19 NOTE — Patient Instructions (Signed)

## 2017-05-20 LAB — HEMOGLOBINOPATHY EVALUATION
HEMOGLOBIN F QUANTITATION: 0 % (ref 0.0–2.0)
HGB C: 0 %
HGB S: 0 %
HGB VARIANT: 0 %
Hemoglobin A2 Quantitation: 2.3 % (ref 1.8–3.2)
Hgb A: 97.7 % (ref 96.4–98.8)

## 2017-05-26 ENCOUNTER — Ambulatory Visit (HOSPITAL_BASED_OUTPATIENT_CLINIC_OR_DEPARTMENT_OTHER): Payer: Medicaid Other

## 2017-05-26 VITALS — BP 103/51 | HR 66 | Temp 98.4°F | Resp 18

## 2017-05-26 DIAGNOSIS — D500 Iron deficiency anemia secondary to blood loss (chronic): Secondary | ICD-10-CM

## 2017-05-26 DIAGNOSIS — K922 Gastrointestinal hemorrhage, unspecified: Secondary | ICD-10-CM

## 2017-05-26 DIAGNOSIS — D539 Nutritional anemia, unspecified: Secondary | ICD-10-CM

## 2017-05-26 MED ORDER — SODIUM CHLORIDE 0.9 % IV SOLN
Freq: Once | INTRAVENOUS | Status: AC
  Administered 2017-05-26: 14:00:00 via INTRAVENOUS

## 2017-05-26 MED ORDER — SODIUM CHLORIDE 0.9 % IV SOLN
510.0000 mg | Freq: Once | INTRAVENOUS | Status: AC
  Administered 2017-05-26: 510 mg via INTRAVENOUS
  Filled 2017-05-26: qty 17

## 2017-05-26 NOTE — Patient Instructions (Signed)

## 2017-05-31 ENCOUNTER — Encounter (HOSPITAL_COMMUNITY): Payer: Self-pay | Admitting: Nurse Practitioner

## 2017-05-31 ENCOUNTER — Emergency Department (HOSPITAL_COMMUNITY)
Admission: EM | Admit: 2017-05-31 | Discharge: 2017-05-31 | Disposition: A | Discharge status: Home / Self Care | Payer: Medicaid Other | Attending: Emergency Medicine | Admitting: Emergency Medicine

## 2017-05-31 DIAGNOSIS — R109 Unspecified abdominal pain: Secondary | ICD-10-CM | POA: Insufficient documentation

## 2017-05-31 DIAGNOSIS — F17200 Nicotine dependence, unspecified, uncomplicated: Secondary | ICD-10-CM | POA: Insufficient documentation

## 2017-05-31 DIAGNOSIS — R112 Nausea with vomiting, unspecified: Secondary | ICD-10-CM | POA: Diagnosis not present

## 2017-05-31 DIAGNOSIS — G8929 Other chronic pain: Secondary | ICD-10-CM | POA: Diagnosis not present

## 2017-05-31 LAB — URINALYSIS, ROUTINE W REFLEX MICROSCOPIC
BILIRUBIN URINE: NEGATIVE
GLUCOSE, UA: NEGATIVE mg/dL
HGB URINE DIPSTICK: NEGATIVE
KETONES UR: NEGATIVE mg/dL
Leukocytes, UA: NEGATIVE
Nitrite: NEGATIVE
PROTEIN: NEGATIVE mg/dL
Specific Gravity, Urine: 1.02 (ref 1.005–1.030)
pH: 7 (ref 5.0–8.0)

## 2017-05-31 LAB — COMPREHENSIVE METABOLIC PANEL
ALT: 12 U/L — AB (ref 14–54)
AST: 19 U/L (ref 15–41)
Albumin: 3.6 g/dL (ref 3.5–5.0)
Alkaline Phosphatase: 52 U/L (ref 38–126)
Anion gap: 3 — ABNORMAL LOW (ref 5–15)
BUN: 13 mg/dL (ref 6–20)
CHLORIDE: 107 mmol/L (ref 101–111)
CO2: 30 mmol/L (ref 22–32)
CREATININE: 0.75 mg/dL (ref 0.44–1.00)
Calcium: 8.4 mg/dL — ABNORMAL LOW (ref 8.9–10.3)
GFR calc Af Amer: >60 mL/min (ref >60)
GLUCOSE: 93 mg/dL (ref 65–99)
Potassium: 3.5 mmol/L (ref 3.5–5.1)
Sodium: 140 mmol/L (ref 135–145)
Total Bilirubin: 0.3 mg/dL (ref 0.3–1.2)
Total Protein: 6.5 g/dL (ref 6.5–8.1)

## 2017-05-31 LAB — LIPASE, BLOOD: Lipase: 26 U/L (ref 11–51)

## 2017-05-31 LAB — CBC
HCT: 27.1 % — ABNORMAL LOW (ref 36.0–46.0)
Hemoglobin: 9.4 g/dL — ABNORMAL LOW (ref 12.0–15.0)
MCH: 34.2 pg — AB (ref 26.0–34.0)
MCHC: 34.7 g/dL (ref 30.0–36.0)
MCV: 98.5 fL (ref 78.0–100.0)
PLATELETS: 208 10*3/uL (ref 150–400)
RBC: 2.75 MIL/uL — ABNORMAL LOW (ref 3.87–5.11)
RDW: 13 % (ref 11.5–15.5)
WBC: 5.7 10*3/uL (ref 4.0–10.5)

## 2017-05-31 LAB — POC URINE PREG, ED: Preg Test, Ur: NEGATIVE

## 2017-05-31 MED ORDER — ONDANSETRON 4 MG PO TBDP: 4.0000 mg | ORAL_TABLET | Freq: Three times a day (TID) | ORAL | 0 refills | Status: DC | PRN

## 2017-05-31 MED ORDER — SODIUM CHLORIDE 0.9 % IV BOLUS (SEPSIS)
1000.0000 mL | Freq: Once | INTRAVENOUS | Status: AC
  Administered 2017-05-31: 1000 mL via INTRAVENOUS

## 2017-05-31 MED ORDER — ONDANSETRON HCL 4 MG/2ML IJ SOLN
4.0000 mg | Freq: Once | INTRAMUSCULAR | Status: AC | PRN
  Administered 2017-05-31: 4 mg via INTRAVENOUS
  Filled 2017-05-31: qty 2

## 2017-05-31 NOTE — Discharge Instructions (Signed)
Takes Zofran as needed for nausea/vomiting. We advise that you drink plenty of clear liquids to prevent dehydration. Follow-up with your gastroenterologist and primary care doctor, especially should symptoms persist.

## 2017-05-31 NOTE — ED Notes (Signed)
Patient given room temp ginger ale as requested.

## 2017-05-31 NOTE — ED Triage Notes (Signed)
Pt arrived via ems from home with c/o nausea, vomiting, abdominal pain x2 hours. She reports increased weakness since this episodes. She is being followed by GI with a colonscopy and endoscopy scheduled in the next few weeks. She states when these episodes occur she can push oral fluids and get over it but this time she became to weak and felt like she was going to pass out. Per EMS #20 LAC and bolus given. VS: 103/72, 16 resp, 66 hr, NSR, CBG 105. Pt reports RUQ pain.

## 2017-05-31 NOTE — ED Provider Notes (Signed)
[facility] DEPT Provider Note   CSN: [id number] Arrival date & time: 05/31/17  0207     History   Chief Complaint Chief Complaint  Patient presents with  . Abdominal Pain  . Nausea  . Emesis    HPI Jennifer Combs is a 31 y.o. female.  31 year old female with a history of irritable bowel syndrome anxiety, depression, and anemia presents to the emergency department for complaints of vomiting and nausea. Symptoms began this evening while patient was at home. Symptoms woke her from sleep. She states that she has diarrhea frequently at baseline. She denies any change in this. No recent bloody diarrhea.She also notes chronic abdominal pain which is present daily. She states that her pain is rated at 9/10 at baseline. She had some abdominal pain this evening associated with her vomiting, but states that it feels similar to her chronic pain.she is followed by Dr. Hilarie Fredrickson of gastroenterology and is due to have a colonoscopy and endoscopy in the next few weeks. She states that she is usually able to drink plenty of fluids to over, spells of abdominal pain, but was unable to tolerate these this evening which caused her to feel weak and lightheaded. EMS administered IV fluids prior to arrival. Patient states that her nausea has subsided since receiving Zofran. Abdominal surgical history significant for tubal ligation.   The history is provided by the patient. No language interpreter was used.  Abdominal Pain   Associated symptoms include vomiting.  Emesis   Associated symptoms include abdominal pain.    Past Medical History:  Diagnosis Date  . Anemia   . Anxiety   . Depression   . Ectopic pregnancy, tubal   . H. pylori infection   . Herpes genitalia   . IBS (irritable bowel syndrome)   . Ovarian cyst   . Trichomonas infection     Patient Active Problem List   Diagnosis Date Noted  . IDA (iron deficiency anemia) 05/19/2017  . Epigastric pain 04/26/2017  . Cyst of right ovary  04/26/2017  . Macrocytic anemia 04/26/2017  . Herpes genitalia 04/08/2011    Past Surgical History:  Procedure Laterality Date  . TUBAL LIGATION      OB History    Gravida Para Term Preterm AB Living   '6 4 3 1 2 4   ' SAB TAB Ectopic Multiple Live Births     '1 1   4       ' Home Medications    Prior to Admission medications   Medication Sig Start Date End Date Taking? Authorizing Provider  folic acid (FOLVITE) 1 MG tablet Take 1 tablet (1 mg total) by mouth daily. 05/18/17  Yes [place], Holli Humbles, NP  dicyclomine (BENTYL) 20 MG tablet Take 1 tablet (20 mg total) by mouth 2 (two) times daily. Patient not taking: Reported on 05/31/2017 01/06/17   Argentina Donovan, PA-C  DULoxetine (CYMBALTA) 20 MG capsule Take 1 capsule (20 mg total) by mouth daily. Patient not taking: Reported on 05/31/2017 04/04/17   Ladell Pier, MD  ondansetron (ZOFRAN ODT) 4 MG disintegrating tablet Take 1 tablet (4 mg total) by mouth every 8 (eight) hours as needed for nausea or vomiting. 05/31/17   Antonietta Breach, PA-C  SUPREP BOWEL PREP KIT 17.5-3.13-1.6 GM/180ML SOLN Take 1 kit by mouth as directed. 05/18/17   Pyrtle, Lajuan Lines, MD    Family History Family History  Problem Relation Age of Onset  . Endometriosis Mother   . Anemia Maternal Grandmother   .  Clotting disorder Maternal Grandmother   . Thyroid disease Maternal Grandmother     Social History Social History  Substance Use Topics  . Smoking status: Current Every Day Smoker    Packs/day: 0.25  . Smokeless tobacco: Never Used  . Alcohol use No     Allergies   Citrus and Tomato   Review of Systems Review of Systems  Gastrointestinal: Positive for abdominal pain and vomiting.  Ten systems reviewed and are negative for acute change, except as noted in the HPI.    Physical Exam Updated Vital Signs BP (!) 100/47 (BP Location: Right Arm)   Pulse (!) 52   Temp 97.7 F (36.5 C) (Oral)   Resp 18   Ht '4\' 11"'  (1.499 m)   Wt 61.2 kg (135 lb)    SpO2 99%   BMI 27.27 kg/m   Physical Exam  Constitutional: She is oriented to person, place, and time. She appears well-developed and well-nourished. No distress.  Nontoxic appearing in no acute distress. Tearful.  HENT:  Head: Normocephalic and atraumatic.  Eyes: Conjunctivae and EOM are normal. No scleral icterus.  Neck: Normal range of motion.  Cardiovascular: Normal rate, regular rhythm and intact distal pulses.   Pulmonary/Chest: Effort normal. No respiratory distress. She has no wheezes.  Respirations even and unlabored. Lungs clear.  Abdominal: Soft. She exhibits no mass. There is no guarding.  No distention or focal abdominal tenderness. No masses or peritoneal signs.  Musculoskeletal: Normal range of motion.  Neurological: She is alert and oriented to person, place, and time. She exhibits normal muscle tone. Coordination normal.  GCS 15. Patient moving all extremities.  Skin: Skin is warm and dry. No rash noted. She is not diaphoretic. No erythema. No pallor.  Psychiatric: She has a normal mood and affect. Her behavior is normal.  Nursing note and vitals reviewed.    ED Treatments / Results  Labs (all labs ordered are listed, but only abnormal results are displayed) Labs Reviewed  COMPREHENSIVE METABOLIC PANEL - Abnormal; Notable for the following:       Result Value   Calcium 8.4 (*)    ALT 12 (*)    Anion gap 3 (*)    All other components within normal limits  CBC - Abnormal; Notable for the following:    RBC 2.75 (*)    Hemoglobin 9.4 (*)    HCT 27.1 (*)    MCH 34.2 (*)    All other components within normal limits  URINALYSIS, ROUTINE W REFLEX MICROSCOPIC - Abnormal; Notable for the following:    APPearance HAZY (*)    All other components within normal limits  LIPASE, BLOOD  POC URINE PREG, ED    EKG  EKG Interpretation None       Radiology No results found.  Procedures Procedures (including critical care time)  Medications Ordered in  ED Medications  ondansetron (ZOFRAN) injection 4 mg (4 mg Intravenous Given 05/31/17 0236)  sodium chloride 0.9 % bolus 1,000 mL (0 mLs Intravenous Stopped 05/31/17 0341)     Initial Impression / Assessment and Plan / ED Course  I have reviewed the triage vital signs and the nursing notes.  Pertinent labs & imaging results that were available during my care of the patient were reviewed by me and considered in my medical decision making (see chart for details).     31 year old female with a history of chronic abdominal pain and chronic diarrhea presents to the emergency department for evaluation of one episode  of vomiting which began prior to arrival. Symptoms easily controlled with antiemetics. Patient has been hydrated with 1.25 L of IV fluids. She has never been tachycardic. Hypotension is at baseline for the patient. She is afebrile. No focal tenderness noted on abdominal exam. No peritoneal signs.  Laboratory workup reviewed which is reassuring. Patient has no leukocytosis or electrolyte derangements. Anemia at baseline. Liver and kidney function preserved. Pregnancy is negative and urinalysis does not suggest infection. The patient has been able to tolerate ginger ale in the emergency department. She has been able to ambulate to the restroom independently. Given known chronicity of symptoms, I do not believe further emergent workup is indicated. The patient has been instructed to follow-up with her gastroenterologist as needed. Return precautions discussed and provided. Patient discharged in stable condition with no unaddressed concerns.   Vitals:   05/31/17 0330 05/31/17 0430  BP: 99/65 (!) 100/47  Pulse: (!) 58 (!) 52  Resp: 16 18  Temp:    SpO2: 98% 99%    Final Clinical Impressions(s) / ED Diagnoses   Final diagnoses:  Non-intractable vomiting with nausea, unspecified vomiting type  Chronic abdominal pain    New Prescriptions New Prescriptions   ONDANSETRON (ZOFRAN ODT) 4  MG DISINTEGRATING TABLET    Take 1 tablet (4 mg total) by mouth every 8 (eight) hours as needed for nausea or vomiting.     Antonietta Breach, PA-C 82/64/15 [id number]    Delora Fuel, MD 40/76/80 [phone]

## 2017-06-02 ENCOUNTER — Telehealth: Payer: Self-pay | Admitting: Internal Medicine

## 2017-06-02 ENCOUNTER — Telehealth: Payer: Self-pay | Admitting: Obstetrics & Gynecology

## 2017-06-02 NOTE — Telephone Encounter (Signed)
Routing to Dr. Miller FYI, will close encounter.  

## 2017-06-02 NOTE — Telephone Encounter (Signed)
Pts mother states pt is still having problems with N/V and diarrhea. Went to the ER via EMS the other day and received IV fluids for dehydration. States the bentyl is not helping. Requesting procedures perhaps be scheduled sooner. Pts ECL moved to 06/06/17 at 11am. Instructed pts mother how to adjust the prep to the new appt time, she verbalized understanding.

## 2017-06-02 NOTE — Telephone Encounter (Signed)
Spoke with patients mother "Jennifer Combs", ok per current dpr. Mom states daughter is no better since seeing Dr. Hyacinth MeekerMiller. Reports continued N/V/ diarrhea and "not feeling well". Medication prescribed by GI was increased, not helping. Mom is concerned as she [place] in [place] and keeps traveling to [place] to assist daughter with getting to appointments and care.  Evaluated by GI and scheduled for endoscopy and colonoscopy on 07/10/17.   [facility]/hematology on 8/2 and 8/9 for iron transfusion.  8/14- [facility] ED for N/V/ diarrhea.  Mom asking if there is anything else Dr. Hyacinth MeekerMiller recommends? Recommended patient to f/u with GI or PCP for evaluation. Advised patient would review Dr. Hyacinth MeekerMiller and return call with any additional recommendations. Mom thankful and verbalizes understanding.   Dr. Hyacinth MeekerMiller -any additional recommendations?

## 2017-06-02 NOTE — Telephone Encounter (Signed)
Patient's mother called and wants to speak with Dr Hyacinth MeekerMiller or a nurse.  States her daughter's condition has gotten worse since she saw Dr Hyacinth MeekerMiller.

## 2017-06-02 NOTE — Telephone Encounter (Signed)
Patient's mom called to let the nurse know the the GI doctor, Dr. Rhea BeltonPyrtle, moved the needed test to Monday, 06/06/17, at 11:00. Patient's mom expressed gratitude for the nurses assistance.

## 2017-06-06 ENCOUNTER — Encounter: Payer: Self-pay | Admitting: Internal Medicine

## 2017-06-06 ENCOUNTER — Ambulatory Visit (AMBULATORY_SURGERY_CENTER): Payer: Medicaid Other | Admitting: Internal Medicine

## 2017-06-06 VITALS — BP 110/68 | HR 59 | Temp 98.6°F | Resp 19 | Ht 59.0 in | Wt 132.0 lb

## 2017-06-06 DIAGNOSIS — R140 Abdominal distension (gaseous): Secondary | ICD-10-CM

## 2017-06-06 DIAGNOSIS — R195 Other fecal abnormalities: Secondary | ICD-10-CM | POA: Diagnosis not present

## 2017-06-06 DIAGNOSIS — R112 Nausea with vomiting, unspecified: Secondary | ICD-10-CM

## 2017-06-06 DIAGNOSIS — R1084 Generalized abdominal pain: Secondary | ICD-10-CM | POA: Diagnosis not present

## 2017-06-06 DIAGNOSIS — Z8619 Personal history of other infectious and parasitic diseases: Secondary | ICD-10-CM | POA: Diagnosis not present

## 2017-06-06 DIAGNOSIS — K2950 Unspecified chronic gastritis without bleeding: Secondary | ICD-10-CM | POA: Diagnosis not present

## 2017-06-06 DIAGNOSIS — B9681 Helicobacter pylori [H. pylori] as the cause of diseases classified elsewhere: Secondary | ICD-10-CM | POA: Diagnosis not present

## 2017-06-06 MED ORDER — SODIUM CHLORIDE 0.9 % IV SOLN: 500.0000 mL | INTRAVENOUS | Status: DC

## 2017-06-06 NOTE — Op Note (Signed)
[facility] Patient Name: Jennifer Combs Procedure Date: 06/06/2017 10:58 AM MRN: [id number] Endoscopist: Beverley Fiedler , MD Age: 31 Referring MD:  Date of Birth: 1986/03/02 Gender: Female Account #: [id number] Procedure:                Upper GI endoscopy Indications:              Generalized abdominal pain, Follow-up of                            Helicobacter pylori, Abdominal bloating, Diarrhea,                            Nausea with vomiting Medicines:                Monitored Anesthesia Care Procedure:                Pre-Anesthesia Assessment:                           - Prior to the procedure, a History and Physical                            was performed, and patient medications and                            allergies were reviewed. The patient's tolerance of                            previous anesthesia was also reviewed. The risks                            and benefits of the procedure and the sedation                            options and risks were discussed with the patient.                            All questions were answered, and informed consent                            was obtained. Prior Anticoagulants: The patient has                            taken no previous anticoagulant or antiplatelet                            agents. ASA Grade Assessment: II - A patient with                            mild systemic disease. After reviewing the risks                            and benefits, the patient was deemed in  satisfactory condition to undergo the procedure.                           After obtaining informed consent, the endoscope was                            passed under direct vision. Throughout the                            procedure, the patient's blood pressure, pulse, and                            oxygen saturations were monitored continuously. The                            Endoscope was introduced through the  mouth, and                            advanced to the second part of duodenum. The upper                            GI endoscopy was accomplished without difficulty.                            The patient tolerated the procedure well. Scope In: Scope Out: Findings:                 The esophagus was normal.                           The stomach was normal.                           The examined duodenum was normal.                           Biopsies were taken with a cold forceps in the                            gastric body, at the incisura and in the gastric                            antrum for histology and Helicobacter pylori                            testing.                           Biopsies for histology were taken with a cold                            forceps in the duodenal bulb and in the second                            portion of the duodenum for evaluation of celiac  disease. Complications:            No immediate complications. Estimated Blood Loss:     Estimated blood loss was minimal. Impression:               - Normal esophagus.                           - Normal stomach.                           - Normal examined duodenum.                           - Biopsies were taken with a cold forceps for                            histology and Helicobacter pylori testing.                           - Biopsies were taken with a cold forceps for                            evaluation of celiac disease. Recommendation:           - Patient has a contact number available for                            emergencies. The signs and symptoms of potential                            delayed complications were discussed with the                            patient. Return to normal activities tomorrow.                            Written discharge instructions were provided to the                            patient.                           - Resume previous  diet.                           - Continue present medications.                           - Await pathology results.                           - See the other procedure note for documentation of                            additional recommendations. Beverley Fiedler, MD 06/06/2017 11:27:51 AM This report has been signed electronically.

## 2017-06-06 NOTE — Patient Instructions (Signed)
YOU HAD AN ENDOSCOPIC PROCEDURE TODAY AT THE [facility]:   Refer to the procedure report that was given to you for any specific questions about what was found during the examination.  If the procedure report does not answer your questions, please call your gastroenterologist to clarify.  If you requested that your care partner not be given the details of your procedure findings, then the procedure report has been included in a sealed envelope for you to review at your convenience later.  YOU SHOULD EXPECT: Some feelings of bloating in the abdomen. Passage of more gas than usual.  Walking can help get rid of the air that was put into your GI tract during the procedure and reduce the bloating. If you had a lower endoscopy (such as a colonoscopy or flexible sigmoidoscopy) you may notice spotting of blood in your stool or on the toilet paper. If you underwent a bowel prep for your procedure, you may not have a normal bowel movement for a few days.  Please Note:  You might notice some irritation and congestion in your nose or some drainage.  This is from the oxygen used during your procedure.  There is no need for concern and it should clear up in a day or so.  SYMPTOMS TO REPORT IMMEDIATELY:   Following lower endoscopy (colonoscopy or flexible sigmoidoscopy):  Excessive amounts of blood in the stool  Significant tenderness or worsening of abdominal pains  Swelling of the abdomen that is new, acute  Fever of 100F or higher   Following upper endoscopy (EGD)  Vomiting of blood or coffee ground material  New chest pain or pain under the shoulder blades  Painful or persistently difficult swallowing  New shortness of breath  Fever of 100F or higher  Black, tarry-looking stools  For urgent or emergent issues, a gastroenterologist can be reached at any hour by calling [phone].   DIET:  We do recommend a small meal at first, but then you may proceed to your regular diet.  Drink  plenty of fluids but you should avoid alcoholic beverages for 24 hours.  ACTIVITY:  You should plan to take it easy for the rest of today and you should NOT DRIVE or use heavy machinery until tomorrow (because of the sedation medicines used during the test).    FOLLOW UP: Our staff will call the number listed on your records the next business day following your procedure to check on you and address any questions or concerns that you may have regarding the information given to you following your procedure. If we do not reach you, we will leave a message.  However, if you are feeling well and you are not experiencing any problems, there is no need to return our call.  We will assume that you have returned to your regular daily activities without incident.  If any biopsies were taken you will be contacted by phone or by letter within the next 1-3 weeks.  Please call us at [phone] if you have not heard about the biopsies in 3 weeks.   Await for biopsy results Return Colonoscopy for screening purposes at age 31 Return home breath  Test to exclude SIBO   SIGNATURES/CONFIDENTIALITY: You and/or your care partner have signed paperwork which will be entered into your electronic medical record.  These signatures attest to the fact that that the information above on your After Visit Summary has been reviewed and is understood.  Full responsibility of the confidentiality of  this discharge information lies with you and/or your care-partner.

## 2017-06-06 NOTE — Progress Notes (Signed)
Report to PACU, RN, vss, BBS= Clear.  

## 2017-06-06 NOTE — Progress Notes (Signed)
Called to room to assist during endoscopic procedure.  Patient ID and intended procedure confirmed with present staff. Received instructions for my participation in the procedure from the performing physician.  

## 2017-06-06 NOTE — Op Note (Signed)
[facility] Patient Name: Jennifer Combs Procedure Date: 06/06/2017 10:57 AM MRN: [id number] Endoscopist: Beverley Fiedler , MD Age: 31 Referring MD:  Date of Birth: [birth year] Gender: Female Account #: [id number] Procedure:                Colonoscopy Indications:              Generalized abdominal pain, Clinically significant                            diarrhea of unexplained origin, Weight loss,                            abdominal bloating Medicines:                Monitored Anesthesia Care Procedure:                Pre-Anesthesia Assessment:                           - Prior to the procedure, a History and Physical                            was performed, and patient medications and                            allergies were reviewed. The patient's tolerance of                            previous anesthesia was also reviewed. The risks                            and benefits of the procedure and the sedation                            options and risks were discussed with the patient.                            All questions were answered, and informed consent                            was obtained. Prior Anticoagulants: The patient has                            taken no previous anticoagulant or antiplatelet                            agents. ASA Grade Assessment: II - A patient with                            mild systemic disease. After reviewing the risks                            and benefits, the patient was deemed in  satisfactory condition to undergo the procedure.                           After obtaining informed consent, the colonoscope                            was passed under direct vision. Throughout the                            procedure, the patient's blood pressure, pulse, and                            oxygen saturations were monitored continuously. The                            Model PCF-H190DL [id number]) scope was  introduced                            through the anus and advanced to the the terminal                            ileum. The colonoscopy was performed without                            difficulty. The patient tolerated the procedure                            well. The quality of the bowel preparation was                            excellent. The terminal ileum, ileocecal valve,                            appendiceal orifice, and rectum were photographed. Scope In: 11:12:24 AM Scope Out: 11:22:14 AM Scope Withdrawal Time: 0 hours 7 minutes 5 seconds  Total Procedure Duration: 0 hours 9 minutes 50 seconds  Findings:                 The digital rectal exam was normal.                           The terminal ileum appeared normal.                           The entire examined colon appeared normal.                           Biopsies for histology were taken with a cold                            forceps from the right colon and left colon for                            evaluation of microscopic colitis.  Internal hemorrhoids were found during                            retroflexion. The hemorrhoids were small. Complications:            No immediate complications. Estimated Blood Loss:     Estimated blood loss was minimal. Impression:               - The examined portion of the ileum was normal.                           - The entire examined colon is normal.                           - Small internal hemorrhoids.                           - Biopsies were taken with a cold forceps from the                            right colon and left colon for evaluation of                            microscopic colitis. Recommendation:           - Patient has a contact number available for                            emergencies. The signs and symptoms of potential                            delayed complications were discussed with the                            patient. Return  to normal activities tomorrow.                            Written discharge instructions were provided to the                            patient.                           - Resume previous diet.                           - Continue present medications.                           - Await pathology results, further recommendations                            thereafter.                           - Repeat colonoscopy for screening purposes at age  47.                           - Return home breath test to exclude SIBO. Beverley Fiedler, MD 06/06/2017 11:31:57 AM This report has been signed electronically.

## 2017-06-07 ENCOUNTER — Telehealth: Payer: Self-pay | Admitting: *Deleted

## 2017-06-07 NOTE — Telephone Encounter (Signed)
  Follow up Call-  Call back number 06/06/2017  Post procedure Call Back phone  # [phone]  Permission to leave phone message Yes  Some recent data might be hidden     Patient questions:  Do you have a fever, pain , or abdominal swelling? No. Pain Score  0 *  Have you tolerated food without any problems? No. pt states she has been nauseated and was not able to eat yesterday   Have you been able to return to your normal activities? Yes.    Do you have any questions about your discharge instructions: Diet   No. Medications  No. Follow up visit  No.  Do you have questions or concerns about your Care? No.  Actions: * If pain score is 4 or above: No action needed, pain <4. Pt denies pain but states has been nauseated- she took zofran at 2 am and this helped her nausea- I woke her when I called for the call back and she states as of now she feels better, nausea better but she has not been out of bed. Instructed her to call if she has persistent nausea that the zofran does not help, attempt food when shes up and about andsee if that helps. Pt verbalized understanding - Bufford Spikes  RN

## 2017-06-10 ENCOUNTER — Other Ambulatory Visit: Payer: Self-pay | Admitting: Internal Medicine

## 2017-06-10 ENCOUNTER — Other Ambulatory Visit: Payer: Self-pay

## 2017-06-10 DIAGNOSIS — B9681 Helicobacter pylori [H. pylori] as the cause of diseases classified elsewhere: Secondary | ICD-10-CM

## 2017-06-10 DIAGNOSIS — K2970 Gastritis, unspecified, without bleeding: Principal | ICD-10-CM

## 2017-06-10 MED ORDER — OMEPRAZOLE 20 MG PO CPDR: 20.0000 mg | DELAYED_RELEASE_CAPSULE | Freq: Two times a day (BID) | ORAL | 0 refills | Status: DC

## 2017-06-10 MED ORDER — BIS SUBCIT-METRONID-TETRACYC 140-125-125 MG PO CAPS: 3.0000 | ORAL_CAPSULE | Freq: Three times a day (TID) | ORAL | 0 refills | Status: DC

## 2017-06-14 ENCOUNTER — Telehealth: Payer: Self-pay | Admitting: Obstetrics & Gynecology

## 2017-06-14 NOTE — Telephone Encounter (Signed)
Patient wanting to know if Dr Hyacinth Meeker has gotten her colonoscopy results.

## 2017-06-14 NOTE — Telephone Encounter (Signed)
Left message to call Delrose Rohwer at [phone].  

## 2017-06-14 NOTE — Telephone Encounter (Signed)
Spoke with patient. Patient had colonoscopy and endoscopy done at [facility] on 06/06/17, is to start Prilosec and antibiotic for H pylori. Patient would like to know if Dr. Hyacinth Meeker has reviewed results and what her next steps are for possible laparoscopic surgery? Advised patient would update Dr. Hyacinth Meeker and return call with recommendations, patient is agreeable.  Dr. Hyacinth Meeker -please review and advise?

## 2017-06-15 ENCOUNTER — Telehealth: Payer: Self-pay | Admitting: *Deleted

## 2017-06-15 NOTE — Telephone Encounter (Signed)
-----   Message from Richardson Chiquitoorothy N Kimberlly Norgard, CMA sent at 05/18/2017 10:25 AM EDT ----- Did pt return SIBO testing?

## 2017-06-15 NOTE — Telephone Encounter (Signed)
Dr Rhea BeltonPyrtle- Patient has not returned her SIBO testing. However, it appears she is being treated for H Pylori since positive testing on EGD. Does she still need SIBO testing at this time since she has been found to have H Pylori?

## 2017-06-15 NOTE — Telephone Encounter (Signed)
Spoke with patient, advised as seen below per Dr. Hyacinth MeekerMiller. Patient expressed appreciation for help and care that she has received. Scheduled for 2 month f/u with Dr. Hyacinth MeekerMiller on 08/16/17 at 2:30pm. Patient verbalizes understanding and is agreeable.   Patient is agreeable to disposition. Will close encounter.

## 2017-06-15 NOTE — Telephone Encounter (Signed)
Noted  

## 2017-06-15 NOTE — Telephone Encounter (Signed)
I want her to complete the antibiotics and return the stool sample as directed by Dr. Rhea BeltonPyrtle and follow up with him in one month.  She can follow up with me in two months.  I saw she went to the ER this month as well.  I really want her to try and see Dr. Rhea BeltonPyrtle anytime she is having abdominal issues esp with nausea and vomiting so he can help us work through this.  OK to schedule her follow up with me.

## 2017-06-15 NOTE — Telephone Encounter (Signed)
Would wait to see if abd symptoms improve after HP treatment We can pursue the formal SIBO symptoms if she remains symptomatic after therapy Thanks

## 2017-06-16 ENCOUNTER — Telehealth: Payer: Self-pay | Admitting: Internal Medicine

## 2017-06-16 NOTE — Telephone Encounter (Signed)
Discussed with pt that she could try Imodium for the loose stools or Metamucil to help bulk up the stool. Pt states she does not like to take medicine and she is not going to take Imodium. Pt states she will figure it out.

## 2017-06-22 ENCOUNTER — Ambulatory Visit: Payer: Medicaid Other | Admitting: Internal Medicine

## 2017-06-28 ENCOUNTER — Ambulatory Visit (HOSPITAL_COMMUNITY): Payer: Medicaid Other

## 2017-06-29 ENCOUNTER — Encounter: Payer: Medicaid Other | Admitting: Internal Medicine

## 2017-06-30 ENCOUNTER — Ambulatory Visit: Payer: Medicaid Other | Admitting: Family

## 2017-06-30 ENCOUNTER — Other Ambulatory Visit: Payer: Medicaid Other

## 2017-07-22 ENCOUNTER — Ambulatory Visit (HOSPITAL_BASED_OUTPATIENT_CLINIC_OR_DEPARTMENT_OTHER): Payer: Medicaid Other | Admitting: Family

## 2017-07-22 ENCOUNTER — Other Ambulatory Visit (HOSPITAL_BASED_OUTPATIENT_CLINIC_OR_DEPARTMENT_OTHER): Payer: Medicaid Other

## 2017-07-22 ENCOUNTER — Encounter: Payer: Self-pay | Admitting: Family

## 2017-07-22 VITALS — BP 110/59 | HR 74 | Temp 98.1°F | Resp 18 | Wt 132.0 lb

## 2017-07-22 DIAGNOSIS — N920 Excessive and frequent menstruation with regular cycle: Secondary | ICD-10-CM

## 2017-07-22 DIAGNOSIS — D500 Iron deficiency anemia secondary to blood loss (chronic): Secondary | ICD-10-CM

## 2017-07-22 LAB — CBC WITH DIFFERENTIAL (CANCER CENTER ONLY)
BASO#: 0 10*3/uL (ref 0.0–0.2)
BASO%: 0.2 % (ref 0.0–2.0)
EOS%: 2.3 % (ref 0.0–7.0)
Eosinophils Absolute: 0.1 10*3/uL (ref 0.0–0.5)
HEMATOCRIT: 32 % — AB (ref 34.8–46.6)
HGB: 10.8 g/dL — ABNORMAL LOW (ref 11.6–15.9)
LYMPH#: 1.9 10*3/uL (ref 0.9–3.3)
LYMPH%: 44.2 % (ref 14.0–48.0)
MCH: 34.1 pg — ABNORMAL HIGH (ref 26.0–34.0)
MCHC: 33.8 g/dL (ref 32.0–36.0)
MCV: 101 fL (ref 81–101)
MONO#: 0.2 10*3/uL (ref 0.1–0.9)
MONO%: 5.5 % (ref 0.0–13.0)
NEUT#: 2.1 10*3/uL (ref 1.5–6.5)
NEUT%: 47.8 % (ref 39.6–80.0)
PLATELETS: 212 10*3/uL (ref 145–400)
RBC: 3.17 10*6/uL — AB (ref 3.70–5.32)
RDW: 11.6 % (ref 11.1–15.7)
WBC: 4.3 10*3/uL (ref 3.9–10.0)

## 2017-07-22 LAB — FERRITIN: Ferritin: 158 ng/ml (ref 9–269)

## 2017-07-22 LAB — IRON AND TIBC
%SAT: 29 % (ref 21–57)
IRON: 69 ug/dL (ref 41–142)
TIBC: 240 ug/dL (ref 236–444)
UIBC: 171 ug/dL (ref 120–384)

## 2017-07-22 NOTE — Progress Notes (Signed)
Hematology and Oncology Follow Up Visit  Jennifer Combs [id number] 05/30/1986 31 y.o. 07/22/2017   Principle Diagnosis:  Iron deficiency anemia secondary to menorrhagia   Current Therapy:   IV iron as indicated - last received in August 21018 x 2   Interim History:  Jennifer Combs is here today with her mom and niece for follow-up. She received IV iron in August and has responded nicely. Her symptoms are improving and she is feeling good today.  She has had some occasional fatigue, chills and puffiness in her feet that comes and goes.  Her cycle is still heavy and regular. Hgb today is 10.8.  She has a good pit of anxiety and will have palpitations at times.  She takes her folic acid daily.  She will also take prilosec as needed for GERD.  The intermittent numbness and tingling in her extremities comes and goes and is unchanged.  She denies fever, n/v, cough, rash, dizziness, SOB, chest pain, abdominal pain or changes in bowel or bladder habits.  No swelling or tenderness in her extremities at this time.  She has maintained a good appetite and is staying well hydrated. Her weight is stable.   ECOG Performance Status: 1 - Symptomatic but completely ambulatory  Medications:  Allergies as of 07/22/2017      Reactions   Adhesive [tape]    Adhesive from electrodes   Citrus    Tomato    MAKES MOUTH RAW.      Medication List       Accurate as of 07/22/17 11:23 AM. Always use your most recent med list.          bismuth-metronidazole-tetracycline 140-125-125 MG capsule Commonly known as:  PYLERA Take 3 capsules by mouth 4 (four) times daily -  before meals and at bedtime.   dicyclomine 20 MG tablet Commonly known as:  BENTYL Take 1 tablet (20 mg total) by mouth 2 (two) times daily.   DULoxetine 20 MG capsule Commonly known as:  CYMBALTA Take 1 capsule (20 mg total) by mouth daily.   folic acid 1 MG tablet Commonly known as:  FOLVITE Take 1 tablet (1 mg total) by mouth  daily.   omeprazole 20 MG capsule Commonly known as:  PRILOSEC Take 1 capsule (20 mg total) by mouth 2 (two) times daily before a meal.   ondansetron 4 MG disintegrating tablet Commonly known as:  ZOFRAN ODT Take 1 tablet (4 mg total) by mouth every 8 (eight) hours as needed for nausea or vomiting.       Allergies:  Allergies  Allergen Reactions  . Adhesive [Tape]     Adhesive from electrodes  . Citrus   . Tomato     MAKES MOUTH RAW.    Past Medical History, Surgical history, Social history, and Family History were reviewed and updated.  Review of Systems: All other 10 point review of systems is negative.   Physical Exam:  weight is 132 lb (59.9 kg). Her oral temperature is 98.1 F (36.7 C). Her blood pressure is 110/59 (abnormal) and her pulse is 74. Her respiration is 18 and oxygen saturation is 100%.   Wt Readings from Last 3 Encounters:  07/22/17 132 lb (59.9 kg)  06/06/17 132 lb (59.9 kg)  05/31/17 135 lb (61.2 kg)    Ocular: Sclerae unicteric, pupils equal, round and reactive to light Ear-nose-throat: Oropharynx clear, dentition fair Lymphatic: No cervical, supraclavicular or axillary adenopathy Lungs no rales or rhonchi, good excursion bilaterally Heart regular rate  and rhythm, no murmur appreciated Abd soft, nontender, positive bowel sounds, no liver or spleen tip palpated on exam, no fluid wave MSK no focal spinal tenderness, no joint edema Neuro: non-focal, well-oriented, appropriate affect Breasts: Deferred   Lab Results  Component Value Date   WBC 4.3 07/22/2017   HGB 10.8 (L) 07/22/2017   HCT 32.0 (L) 07/22/2017   MCV 101 07/22/2017   PLT 212 07/22/2017   Lab Results  Component Value Date   FERRITIN 37 05/18/2017   IRON 30 (L) 05/18/2017   TIBC 328 05/18/2017   UIBC 298 05/18/2017   IRONPCTSAT 9 (L) 05/18/2017   Lab Results  Component Value Date   RETICCTPCT 1.3 05/02/2017   RBC 3.17 (L) 07/22/2017   No results found for: KPAFRELGTCHN,  LAMBDASER, KAPLAMBRATIO No results found for: IGGSERUM, IGA, IGMSERUM No results found for: Marda Stalker, SPEI   Chemistry      Component Value Date/Time   NA 140 05/31/2017 0221   NA 141 05/18/2017 1310   K 3.5 05/31/2017 0221   K 3.6 05/18/2017 1310   CL 107 05/31/2017 0221   CL 106 05/18/2017 1310   CO2 30 05/31/2017 0221   CO2 26 05/18/2017 1310   BUN 13 05/31/2017 0221   BUN 12 05/18/2017 1310   CREATININE 0.75 05/31/2017 0221   CREATININE 0.75 05/18/2017 1310      Component Value Date/Time   CALCIUM 8.4 (L) 05/31/2017 0221   CALCIUM 11.7 (H) 05/18/2017 1310   ALKPHOS 52 05/31/2017 0221   ALKPHOS 56 05/18/2017 1310   AST 19 05/31/2017 0221   AST 17 05/18/2017 1310   ALT 12 (L) 05/31/2017 0221   ALT 10 05/18/2017 1310   BILITOT 0.3 05/31/2017 0221   BILITOT 0.3 05/18/2017 1310      Impression and Plan: Jennifer Combs is a very pleasant 31 yo African American female with iron deficiency anemia secondary to heavy cycles. She received Iv iron in August ans his done well.  We will see what her iron studies today show and bring her back in next week for an infusion if needed.  We will go ahead and plan to see her back again in another 2 months for repeat lab and follow-up.  She will contact our office with any questions or concerns. We can certainly see her sooner if need be.   Verdie Mosher, NP 10/5/201811:23 AM

## 2017-08-09 ENCOUNTER — Emergency Department (HOSPITAL_COMMUNITY)
Admission: EM | Admit: 2017-08-09 | Discharge: 2017-08-09 | Discharge status: Against Medical Advice | Payer: Medicaid Other | Attending: Emergency Medicine | Admitting: Emergency Medicine

## 2017-08-09 ENCOUNTER — Encounter (HOSPITAL_COMMUNITY): Payer: Self-pay | Admitting: Nurse Practitioner

## 2017-08-09 DIAGNOSIS — Z79899 Other long term (current) drug therapy: Secondary | ICD-10-CM | POA: Diagnosis not present

## 2017-08-09 DIAGNOSIS — R51 Headache: Secondary | ICD-10-CM | POA: Diagnosis present

## 2017-08-09 DIAGNOSIS — M791 Myalgia, unspecified site: Secondary | ICD-10-CM | POA: Insufficient documentation

## 2017-08-09 DIAGNOSIS — Z5329 Procedure and treatment not carried out because of patient's decision for other reasons: Secondary | ICD-10-CM | POA: Diagnosis not present

## 2017-08-09 DIAGNOSIS — F17200 Nicotine dependence, unspecified, uncomplicated: Secondary | ICD-10-CM | POA: Insufficient documentation

## 2017-08-09 DIAGNOSIS — R519 Headache, unspecified: Secondary | ICD-10-CM

## 2017-08-09 MED ORDER — KETOROLAC TROMETHAMINE 30 MG/ML IJ SOLN
30.0000 mg | Freq: Once | INTRAMUSCULAR | Status: AC
  Administered 2017-08-09: 30 mg via INTRAVENOUS
  Filled 2017-08-09: qty 1

## 2017-08-09 MED ORDER — DIPHENHYDRAMINE HCL 50 MG/ML IJ SOLN
25.0000 mg | Freq: Once | INTRAMUSCULAR | Status: AC
  Administered 2017-08-09: 25 mg via INTRAVENOUS
  Filled 2017-08-09: qty 1

## 2017-08-09 MED ORDER — SODIUM CHLORIDE 0.9 % IV BOLUS (SEPSIS)
1000.0000 mL | Freq: Once | INTRAVENOUS | Status: AC
  Administered 2017-08-09: 1000 mL via INTRAVENOUS

## 2017-08-09 MED ORDER — DEXAMETHASONE SODIUM PHOSPHATE 4 MG/ML IJ SOLN
10.0000 mg | Freq: Once | INTRAMUSCULAR | Status: AC
  Administered 2017-08-09: 10 mg via INTRAVENOUS
  Filled 2017-08-09: qty 3

## 2017-08-09 MED ORDER — ACETAMINOPHEN 500 MG PO TABS
1000.0000 mg | ORAL_TABLET | Freq: Once | ORAL | Status: AC
  Administered 2017-08-09: 1000 mg via ORAL
  Filled 2017-08-09: qty 2

## 2017-08-09 MED ORDER — PROCHLORPERAZINE EDISYLATE 5 MG/ML IJ SOLN
10.0000 mg | Freq: Once | INTRAMUSCULAR | Status: AC
  Administered 2017-08-09: 10 mg via INTRAVENOUS
  Filled 2017-08-09: qty 2

## 2017-08-09 NOTE — ED Notes (Signed)
Pt requesting warm blanket, provided by RN, family at bedside. Pt sleeping comfortably in bed with lights off.

## 2017-08-09 NOTE — ED Triage Notes (Signed)
Pt endorses sharp shooting pains in head x 3-4 days. Pt has had similar migraines in the past that were relieved on their own. Patient states pain has been more consistent this time and lasted longer. Patient also notes that pain shoots down neck and into arm. Patient states worse when she moves her neck. Patient states right side of face is painful but pain is relieved with pressure on face. Patient notes right sided lower eye lid twitching with this episode of migraine. Patient has continued to take her cymbalta without relief- sts has not tried OTC relief because it has not helped in the past. Pt denies blurry vision, neck stiffness, fevers/chills, nausea vomiting, slurred speech, weakness.

## 2017-08-09 NOTE — ED Notes (Signed)
Pt states headache x 4 days. Hx of headaches, but denies hx of migraines. Pt reports twitching to lower lid on L eye x 1 week. States hx of panic attacks and had panic attack 1 week ago when twitching began.

## 2017-08-09 NOTE — ED Provider Notes (Signed)
[facility] EMERGENCY [facility]PART[facility] Provider Note   CSN: [id number] Arrival date & time: 08/09/17  1051     History   Chief Complaint Chief Complaint  Patient presents with  . Headache    HPI Jennifer GrumblesShantee R Nickolson is a 31 y.o. female with h/o chronic abdominal pain, marijuana use, tobacco use, occasional headaches presents to ED for evaluation of constant left-sided temporal headache 3-4 days that radiates to bilateral neck. Constant. Also reports her right lateral eyelid has been twitching for a week. She takes Cymbalta for what she describes as bad nerves, she has sharp shooting pains all over her body. Cymbalta not helping. States she does not take pain medicine so has not tried ibuprofen or Tylenol or any other interventions for headache. Denies head trauma, neck rigidity, visual changes, nausea, vomiting, fevers, chills, focal numbness or weakness, generalized rashes.reports increased amount of stress recently.states she is supposed to wear glasses but doesn't usually and often strains her eyes to be able to see during the day. No recent changes in medications, caffeine intake. Has started to cut back on cigarette use in the last 2-3 months.  HPI  Past Medical History:  Diagnosis Date  . Allergy   . Anemia   . Anxiety   . Depression   . Ectopic pregnancy, tubal   . GERD (gastroesophageal reflux disease)   . H. pylori infection   . IBS (irritable bowel syndrome)   . Ovarian cyst   . Trichomonas infection     Patient Active Problem List   Diagnosis Date Noted  . IDA (iron deficiency anemia) 05/19/2017  . Epigastric pain 04/26/2017  . Cyst of right ovary 04/26/2017  . Macrocytic anemia 04/26/2017  . Herpes genitalia 04/08/2011    Past Surgical History:  Procedure Laterality Date  . TUBAL LIGATION      OB History    Gravida Para Term Preterm AB Living   6 4 3 1 2 4    SAB TAB Ectopic Multiple Live Births     1 1   4        Home Medications     Prior to Admission medications   Medication Sig Start Date End Date Taking? Authorizing Provider  dicyclomine (BENTYL) 20 MG tablet Take 1 tablet (20 mg total) by mouth 2 (two) times daily. 01/06/17  Yes Anders SimmondsMcClung, Angela M, PA-C  DULoxetine (CYMBALTA) 20 MG capsule Take 1 capsule (20 mg total) by mouth daily. 04/04/17  Yes Marcine MatarJohnson, Deborah B, MD  bismuth-metronidazole-tetracycline [facility](PYLERA) [phone] MG capsule Take 3 capsules by mouth 4 (four) times daily -  before meals and at bedtime. Patient not taking: Reported on 07/22/2017 06/10/17   Pyrtle, Carie CaddyJay M, MD  folic acid (FOLVITE) 1 MG tablet Take 1 tablet (1 mg total) by mouth daily. Patient not taking: Reported on 06/06/2017 05/18/17   [place], Brand MalesSarah M, NP  omeprazole (PRILOSEC) 20 MG capsule Take 1 capsule (20 mg total) by mouth 2 (two) times daily before a meal. Patient not taking: Reported on 08/09/2017 06/10/17   Beverley FiedlerPyrtle, Jay M, MD  ondansetron (ZOFRAN ODT) 4 MG disintegrating tablet Take 1 tablet (4 mg total) by mouth every 8 (eight) hours as needed for nausea or vomiting. Patient not taking: Reported on 08/09/2017 05/31/17   Antony MaduraHumes, Kelly, PA-C    Family History Family History  Problem Relation Age of Onset  . Endometriosis Mother   . Anemia Maternal Grandmother   . Clotting disorder Maternal Grandmother   . Thyroid disease Maternal Grandmother  Social History Social History  Substance Use Topics  . Smoking status: Current Every Day Smoker    Packs/day: 0.10  . Smokeless tobacco: Never Used     Comment: patient down to 2 cigarettes per day  . Alcohol use No     Allergies   Adhesive [tape]; Citrus; and Tomato   Review of Systems Review of Systems  Neurological: Positive for headaches.  All other systems reviewed and are negative.    Physical Exam Updated Vital Signs BP 127/80   Pulse 68   Temp 98.7 F (37.1 C) (Oral)   Resp 16   Ht 4\' 11"  (1.499 m)   Wt 59.9 kg (132 lb)   LMP 08/09/2017   SpO2 99%   BMI  26.66 kg/m   Physical Exam  Constitutional: She is oriented to person, place, and time. She appears well-developed and well-nourished. No distress.  NAD.  HENT:  Head: Normocephalic and atraumatic.  Right Ear: External ear normal.  Left Ear: External ear normal.  Nose: Nose normal.  TMs normal bilaterally Moist mucous membranes Tonsils and oropharynx normal  Eyes: Conjunctivae are normal. No scleral icterus.  PERRL and EOMs normal bilaterally  Neck: Normal range of motion. Neck supple. Muscular tenderness present. No spinous process tenderness present.  No midline C spine tenderness  Bilateral neck muscular tenderness (R>L)  Cardiovascular: Normal rate, regular rhythm and normal heart sounds.   No murmur heard. Pulses:      Carotid pulses are 2+ on the right side, and 2+ on the left side.      Radial pulses are 2+ on the right side, and 2+ on the left side.       Dorsalis pedis pulses are 2+ on the right side, and 2+ on the left side.  Pulmonary/Chest: Effort normal and breath sounds normal. She has no decreased breath sounds. She has no wheezes.  Abdominal: Soft. Normal appearance and bowel sounds are normal.  Musculoskeletal: Normal range of motion. She exhibits no deformity.  Neurological: She is alert and oriented to person, place, and time.  Speech and phonation normal.  Strength 5/5 with hand grip and ankle flexion/extension.   Sensation to light touch intact in hands and feet. Gait normal. No leg drift.   CN I not tested CN II full visual fields bilaterally CN III, IV, VI PEERL and EOMs intact bilaterally CN V light touch intact in all 3 divisions of trigeminal nerve CN VII facial nerve movements intact, symmetric, bilaterally CN VIII hearing intact to finger rub, bilaterally CN IX, X no uvula deviation, symmetric soft palate rise CN XI 5/5 SCM and trapezius strength bilaterally  CN XII Tongue midline with symmetric L/R movement  Skin: Skin is warm and dry. Capillary  refill takes less than 2 seconds.  Psychiatric: She has a normal mood and affect. Her behavior is normal. Judgment and thought content normal.  Nursing note and vitals reviewed.    ED Treatments / Results  Labs (all labs ordered are listed, but only abnormal results are displayed) Labs Reviewed - No data to display  EKG  EKG Interpretation None       Radiology No results found.  Procedures Procedures (including critical care time)  Medications Ordered in ED Medications  sodium chloride 0.9 % bolus 1,000 mL (0 mLs Intravenous Stopped 08/09/17 1438)  dexamethasone (DECADRON) injection 10 mg (10 mg Intravenous Given 08/09/17 1407)  prochlorperazine (COMPAZINE) injection 10 mg (10 mg Intravenous Given 08/09/17 1407)  ketorolac (TORADOL) 30 MG/ML injection  30 mg (30 mg Intravenous Given 08/09/17 1408)  diphenhydrAMINE (BENADRYL) injection 25 mg (25 mg Intravenous Given 08/09/17 1407)  acetaminophen (TYLENOL) tablet 1,000 mg (1,000 mg Oral Given 08/09/17 1406)     Initial Impression / Assessment and Plan / ED Course  I have reviewed the triage vital signs and the nursing notes.  Pertinent labs & imaging results that were available during my care of the patient were reviewed by me and considered in my medical decision making (see chart for details).  Clinical Course as of Aug 09 1457  Tue Aug 09, 2017  1336 Went to evaluate pt. She woke up and asked me what time it was, states she has to leave to pick up her kids. She is frustrated about wait time. I explained my shift started 1.5 hr ago and she is the 5th patient I have seen.  I offered NSAIDs before leaving but she declined stating nobody is at the bus stop to pick up her kids and she has been waiting for "hours" waiting to be seen. Again I apologized and told her I will work on her discharge paper work, advised she take ibuprofen/tylenol and return to ED for re-eval as soon as able to. Will d/c  [CG]  1355 Pt came out of room  screaming saying she was "asleep" while I was asking her questions and she wants to be seen.   [CG]    Clinical Course User Index [CG] Liberty Handy, PA-C   Patient is a 31 y.o. yo female with pertinent pmh of occasional headaches that presents with headache x 3-4 days. Pt w/o high-risk features of headache including: sudden onset/thunderclap HA, AMS, seizure, headache with exertion, age > 21, history of immunocompromise, neck rigidity, generalized rash, fever, use of anticoagulation, family history of spontaneous SAH, concomitant drug use or toxic exposure. On exam. no meningismus, no nystagmus, no focal neuro deficits, no pain over temporal arteries.  Imaging with CT/MRI not indicated given history and physical exam findings. No emergent intracranial or vascular pathology suspected or identified by history, physical exam.  No indications for hospitalization identified. Pt given migraine cocktail in ED.    I was informed pt had left soon after getting migraine cocktail to pick up her children. Pt was asked my RN and EMT if headache was better but keep replying "i can't tell". She was seen ambulating out of ED w/o difficulty.  Final Clinical Impressions(s) / ED Diagnoses   Final diagnoses:  Bad headache    New Prescriptions Discharge Medication List as of 08/09/2017  2:38 PM       Liberty Handy, PA-C 08/09/17 1458    Pricilla Loveless, MD 08/09/17 1711

## 2017-08-11 ENCOUNTER — Other Ambulatory Visit: Payer: Self-pay | Admitting: *Deleted

## 2017-08-11 DIAGNOSIS — D500 Iron deficiency anemia secondary to blood loss (chronic): Secondary | ICD-10-CM

## 2017-08-12 ENCOUNTER — Other Ambulatory Visit: Payer: Medicaid Other

## 2017-08-16 ENCOUNTER — Ambulatory Visit (INDEPENDENT_AMBULATORY_CARE_PROVIDER_SITE_OTHER): Payer: Medicaid Other | Admitting: Obstetrics & Gynecology

## 2017-08-16 ENCOUNTER — Other Ambulatory Visit (HOSPITAL_BASED_OUTPATIENT_CLINIC_OR_DEPARTMENT_OTHER): Payer: Medicaid Other

## 2017-08-16 ENCOUNTER — Encounter: Payer: Self-pay | Admitting: Obstetrics & Gynecology

## 2017-08-16 VITALS — BP 106/60 | HR 100 | Resp 16 | Wt 129.0 lb

## 2017-08-16 DIAGNOSIS — R1084 Generalized abdominal pain: Secondary | ICD-10-CM

## 2017-08-16 DIAGNOSIS — D509 Iron deficiency anemia, unspecified: Secondary | ICD-10-CM

## 2017-08-16 DIAGNOSIS — D500 Iron deficiency anemia secondary to blood loss (chronic): Secondary | ICD-10-CM

## 2017-08-16 DIAGNOSIS — N920 Excessive and frequent menstruation with regular cycle: Secondary | ICD-10-CM

## 2017-08-16 DIAGNOSIS — R102 Pelvic and perineal pain: Secondary | ICD-10-CM | POA: Diagnosis not present

## 2017-08-16 DIAGNOSIS — N83201 Unspecified ovarian cyst, right side: Secondary | ICD-10-CM

## 2017-08-16 LAB — IRON AND TIBC
%SAT: 38 % (ref 21–57)
Iron: 93 ug/dL (ref 41–142)
TIBC: 243 ug/dL (ref 236–444)
UIBC: 149 ug/dL (ref 120–384)

## 2017-08-16 LAB — CBC WITH DIFFERENTIAL (CANCER CENTER ONLY)
BASO#: 0 10*3/uL (ref 0.0–0.2)
BASO%: 0.2 % (ref 0.0–2.0)
EOS ABS: 0.1 10*3/uL (ref 0.0–0.5)
EOS%: 1.5 % (ref 0.0–7.0)
HCT: 30.9 % — ABNORMAL LOW (ref 34.8–46.6)
HGB: 10.5 g/dL — ABNORMAL LOW (ref 11.6–15.9)
LYMPH#: 1.8 10*3/uL (ref 0.9–3.3)
LYMPH%: 44.1 % (ref 14.0–48.0)
MCH: 34.2 pg — AB (ref 26.0–34.0)
MCHC: 34 g/dL (ref 32.0–36.0)
MCV: 101 fL (ref 81–101)
MONO#: 0.2 10*3/uL (ref 0.1–0.9)
MONO%: 5.6 % (ref 0.0–13.0)
NEUT%: 48.6 % (ref 39.6–80.0)
NEUTROS ABS: 2 10*3/uL (ref 1.5–6.5)
PLATELETS: 199 10*3/uL (ref 145–400)
RBC: 3.07 10*6/uL — ABNORMAL LOW (ref 3.70–5.32)
RDW: 11.6 % (ref 11.1–15.7)
WBC: 4.1 10*3/uL (ref 3.9–10.0)

## 2017-08-16 LAB — COMPREHENSIVE METABOLIC PANEL
ALK PHOS: 48 U/L (ref 40–150)
ALT: 12 U/L (ref 0–55)
AST: 21 U/L (ref 5–34)
Albumin: 3.9 g/dL (ref 3.5–5.0)
Anion Gap: 9 mEq/L (ref 3–11)
BUN: 11.8 mg/dL (ref 7.0–26.0)
CO2: 24 meq/L (ref 22–29)
Calcium: 9.2 mg/dL (ref 8.4–10.4)
Chloride: 107 mEq/L (ref 98–109)
Creatinine: 0.8 mg/dL (ref 0.6–1.1)
GLUCOSE: 100 mg/dL (ref 70–140)
POTASSIUM: 3.4 meq/L — AB (ref 3.5–5.1)
SODIUM: 140 meq/L (ref 136–145)
Total Bilirubin: 0.7 mg/dL (ref 0.20–1.20)
Total Protein: 6.9 g/dL (ref 6.4–8.3)

## 2017-08-16 LAB — FERRITIN: Ferritin: 165 ng/ml (ref 9–269)

## 2017-08-16 NOTE — Progress Notes (Signed)
GYNECOLOGY  VISIT  CC:   Abdominal pain  HPI: 31 y.o. Jennifer Combs Married [place] American female here for follow up of abdominal pain.  She has seen Dr. Rhea Belton.  She did under go an endoscopy and colonoscopy.  Endoscopy showed inflammation and H. Pylori infection.  She wasn't able to tolerate the treatment for ulcers likely caused by H. Pylori.  Still fees like she is having issues with weight loss (down one or two pounds from last visit).  Bloating has improved some.  She did have a formed bowel movements yesterday.    Mother accompanied the patient.  She reports she feels the pt's pain is much better--still present--but better.  Pt's mother reports she had a hysterectomy in her late 20's due to endometriosis.  Pt has not been tested for this but we have discussed possible laparoscopy with treatment of possible endometriosis.  Feel pt needs to work through GI issues as many of her complaints seem GI related.  Also, when pt first came as a pt, it was for h/o complex ovarian cyst.  She is due follow-up ultrasound.  Initial referral stated large ovarian cyst, 10cm but this was actually only 3cm and complex, like a hemorrhagic cyst.  Would like to try and have follow-up tomorrow, if possible as her mother will still be in town.  GYNECOLOGIC HISTORY: Patient's last menstrual period was 08/09/2017. Contraception: BTL Menopausal hormone therapy: none  Patient Active Problem List   Diagnosis Date Noted  . IDA (iron deficiency anemia) 05/19/2017  . Epigastric pain 04/26/2017  . Cyst of right ovary 04/26/2017  . Macrocytic anemia 04/26/2017  . Herpes genitalia 04/08/2011    Past Medical History:  Diagnosis Date  . Allergy   . Anemia   . Anxiety   . Depression   . Ectopic pregnancy, tubal   . GERD (gastroesophageal reflux disease)   . H. pylori infection   . IBS (irritable bowel syndrome)   . Ovarian cyst   . Trichomonas infection     Past Surgical History:  Procedure Laterality Date  .  TUBAL LIGATION      MEDS:   Current Outpatient Prescriptions on File Prior to Visit  Medication Sig Dispense Refill  . dicyclomine (BENTYL) 20 MG tablet Take 1 tablet (20 mg total) by mouth 2 (two) times daily. 60 tablet 0  . DULoxetine (CYMBALTA) 20 MG capsule Take 1 capsule (20 mg total) by mouth daily. 30 capsule 3  . omeprazole (PRILOSEC) 20 MG capsule Take 1 capsule (20 mg total) by mouth 2 (two) times daily before a meal. 20 capsule 0  . ondansetron (ZOFRAN ODT) 4 MG disintegrating tablet Take 1 tablet (4 mg total) by mouth every 8 (eight) hours as needed for nausea or vomiting. 10 tablet 0  . bismuth-metronidazole-tetracycline (PYLERA) 140-125-125 MG capsule Take 3 capsules by mouth 4 (four) times daily -  before meals and at bedtime. (Patient not taking: Reported on 07/22/2017) 120 capsule 0  . folic acid (FOLVITE) 1 MG tablet Take 1 tablet (1 mg total) by mouth daily. (Patient not taking: Reported on 06/06/2017) 30 tablet 4   No current facility-administered medications on file prior to visit.     ALLERGIES: Adhesive [tape]; Citrus; and Tomato  Family History  Problem Relation Age of Onset  . Endometriosis Mother   . Anemia Maternal Grandmother   . Clotting disorder Maternal Grandmother   . Thyroid disease Maternal Grandmother     SH:  Married, non smoker  Review of Systems  Constitutional: Positive for weight loss (mild since last visit).  Respiratory: Negative.   Cardiovascular: Negative.   Gastrointestinal: Positive for abdominal pain and nausea.  Genitourinary: Negative.   Psychiatric/Behavioral: Negative.     PHYSICAL EXAMINATION:    BP 106/60 (BP Location: Right Arm, Patient Position: Sitting, Cuff Size: Normal)   Pulse 100   Resp 16   Wt 129 lb (58.5 kg)   LMP 08/09/2017   BMI 26.05 kg/m     General appearance: alert, cooperative and appears stated age CV:  Regular rate and rhythm Lungs:  clear to auscultation, no wheezes, rales or rhonchi, symmetric air  entry Abdomen: soft, non-tender; bowel sounds normal; no masses,  no organomegaly  Pelvic: no pelvic exam performed today  Chaperone was present for exam.  Assessment: Abdominal and pelvic pain IBS, likely H. Pylori and inflammation on biopsies with endoscopy (inability to tolerate H Pylori treatment) H/o complex ovarian cyst  Plan: Will ocntact D.r Pyrtle and see if there is any other oral treatment appropriate for the H Pylor before having repeat testing Pelvic ultrasound will be scheduled.  As her mother is in town, will try to do this tomorrow. Consider laparoscopy for additional evaluation of the pelvic pain   ~30 minutes spent with patient and her mother discussing their concerns, frustrations, desires.  >50% of time was in face to face discussion of above.

## 2017-08-16 NOTE — Progress Notes (Signed)
Patient scheduled while in office. Spoke with Huntley DecSara at [facility]. Patient scheduled for US pelvis and US pelvis transvaginal non-ob on 08/17/17 arriving at 2:40pm for 3pm appt. [address] location. Arrive with full bladder, drink 32 oz water 1 hour prior. Requires pre-cert. Patient verbalizes understanding and Is agreeable.

## 2017-08-17 ENCOUNTER — Ambulatory Visit
Admission: RE | Admit: 2017-08-17 | Discharge: 2017-08-17 | Disposition: A | Discharge status: Home / Self Care | Payer: Medicaid Other | Source: Ambulatory Visit | Attending: Obstetrics & Gynecology | Admitting: Obstetrics & Gynecology

## 2017-08-17 ENCOUNTER — Telehealth: Payer: Self-pay | Admitting: Obstetrics & Gynecology

## 2017-08-17 DIAGNOSIS — N83201 Unspecified ovarian cyst, right side: Secondary | ICD-10-CM

## 2017-08-17 NOTE — Telephone Encounter (Signed)
Patient called to let Dr. Hyacinth MeekerMiller know she had her ultrasound today. She said she was told to call with this information.

## 2017-08-17 NOTE — Telephone Encounter (Signed)
Spoke with patient. States PUS completed at [facility] today. [facility] Hyacinth MeekerMiller is out of the office today, once PUS reviewed, will return call with results and recommendations. Patient thankful and verbalizes understanding.  Dr. Hyacinth MeekerMiller- routing FYI.

## 2017-08-22 NOTE — Telephone Encounter (Signed)
Pt notified via phone call from Carmelina DaneJill Hamm, RN.  Ok to close encounter.

## 2017-08-25 ENCOUNTER — Telehealth: Payer: Self-pay | Admitting: Obstetrics & Gynecology

## 2017-08-25 NOTE — Telephone Encounter (Signed)
patient's mother Kalaheo SinkRita (ok per dpr) calling because patient received ultrasound results and she would like clarification on results.

## 2017-08-25 NOTE — Telephone Encounter (Signed)
Spoke with patients mother "[place] SinkRita", ok per current dpr. Reviewed PUS results dated 08/17/17, verbalizes understanding.   Mother request f/u on scheduling possible laparoscopy discussed while in office.   Advised patient would review with Dr. Hyacinth MeekerMiller and return call with recommendations, mother is agreeable.   Dr. Hyacinth MeekerMiller -please advise on possible surgery?  Cc: Billie RuddySally Yeakley, RN

## 2017-08-26 NOTE — Telephone Encounter (Signed)
Spoke with patient and mother, advised of alternative as seen below. Mom states she will return call to Dr. Rhea BeltonPyrtle to schedule OV to discuss alternative options. Patient thankful for f/u and is agreeable.  Routing to Dr. Gloris HamMiller FYI.

## 2017-08-26 NOTE — Telephone Encounter (Signed)
Spoke with Patty. Was advised patient was provided alternative on 8/30 -see telephone encounter in Epic. Alternative would be to "break up" the Pylera, could be prescribed as 3 different medications/pills 4x/day vs. 1 pill.  Advised Patty would update patient and advise to return call to Dr. Lauro FranklinPyrtle's office to review options.

## 2017-08-26 NOTE — Telephone Encounter (Signed)
Can you please call Dr. Lauro FranklinPyrtle's office and see if he can make a recommendation for treatment for her ulcer other than what she is on as she has not tolerated taking it.  She is supposed to follow up with him but has not called to let him know she hasn't finished the treatment?  Thanks.

## 2017-08-30 ENCOUNTER — Telehealth: Payer: Self-pay | Admitting: Internal Medicine

## 2017-08-30 NOTE — Telephone Encounter (Signed)
Pts mother states pt could not take the pylera she was prescribed for H pylori, states it bothered her stomach. Pt was prescribed this in August. Requesting pt be given something different to take. Pt scheduled to see Mike GipAmy Esterwood PA 09/06/17@2pm , pts mother aware of appt.

## 2017-09-05 ENCOUNTER — Ambulatory Visit: Payer: Medicaid Other | Admitting: Internal Medicine

## 2017-09-05 ENCOUNTER — Encounter: Payer: Self-pay | Admitting: *Deleted

## 2017-09-05 VITALS — BP 102/50 | HR 76 | Ht 59.5 in | Wt 135.5 lb

## 2017-09-05 DIAGNOSIS — K589 Irritable bowel syndrome without diarrhea: Secondary | ICD-10-CM

## 2017-09-05 DIAGNOSIS — B9681 Helicobacter pylori [H. pylori] as the cause of diseases classified elsewhere: Secondary | ICD-10-CM | POA: Diagnosis not present

## 2017-09-05 DIAGNOSIS — K2970 Gastritis, unspecified, without bleeding: Secondary | ICD-10-CM

## 2017-09-05 MED ORDER — LEVOFLOXACIN 500 MG PO TABS: 500.0000 mg | ORAL_TABLET | Freq: Every day | ORAL | 0 refills | Status: DC

## 2017-09-05 MED ORDER — OMEPRAZOLE 20 MG PO CPDR: 20.0000 mg | DELAYED_RELEASE_CAPSULE | Freq: Two times a day (BID) | ORAL | 0 refills | Status: DC

## 2017-09-05 MED ORDER — AMOXICILLIN 500 MG PO CAPS: 500.0000 mg | ORAL_CAPSULE | Freq: Two times a day (BID) | ORAL | 0 refills | Status: DC

## 2017-09-05 MED ORDER — AMOXICILLIN 500 MG PO CAPS: 1000.0000 mg | ORAL_CAPSULE | Freq: Two times a day (BID) | ORAL | 0 refills | Status: DC

## 2017-09-05 NOTE — Patient Instructions (Addendum)
We have sent the following medications to your pharmacy for you to pick up at your convenience: Omeprazole 20 mg twice daily before meals x 14 days Levofloxacin 500 mg once daily x 14 days Amoxicillin 1000 mg twice daily x 14 days  If you have problems finishing your antibiotic therapy, please call our office.  Your physician has requested that you go to the basement for the following lab work before leaving today: H Pylori stool antigen (please complete this 1 month AFTER completing antibiotics)  If you are age 29 or older, your body mass index should be between 23-30. Your Body mass index is 26.91 kg/m. If this is out of the aforementioned range listed, please consider follow up with your Primary Care Provider.  If you are age 29 or younger, your body mass index should be between 19-25. Your Body mass index is 26.91 kg/m. If this is out of the aformentioned range listed, please consider follow up with your Primary Care Provider.

## 2017-09-05 NOTE — Progress Notes (Signed)
Subjective:    Patient ID: Jennifer GrumblesShantee R Combs, female    DOB: 12/28/1985, 31 y.o.   MRN: [id number]  HPI Jennifer HaggisShantee Combs is a 31 year old female with a past medical history of H. pylori gastritis, anemia, anxiety and depression who was seen in follow-up.  She is here today with her mother, her sister and her daughter.  She was last seen in the office on 05/18/2017 to evaluate abdominal pain, bloating and loose stools associated with weight loss.  This led to an upper endoscopy which was performed on 06/06/2017 along with colonoscopy on the same day.  EGD was normal however biopsies obtained from the stomach revealed a active gastritis with H. pylori.  Duodenal biopsies were normal.  Her colonoscopy was normal except for internal hemorrhoids and random biopsies were normal without microscopic colitis.  After finding H. pylori we recommended Pylera and twice daily PPI.  She only took this for 2 days and had severe nausea and worsening vomiting.  Therefore she discontinued this medication until this follow-up appointment.  Compared to her symptoms in August she is feeling a little bit better.  She has continued to have burning epigastric abdominal pain and nausea.  However she has been able to eat a more regular diet recently.  She has been able to gain about 5 pounds since her last visit.  She is  off PPI therapy at this time.  Her diarrhea has also improved and she is having more formed but soft stools 1-3 times daily.  No blood in her stool or melena.  Review of Systems As per HPI, otherwise negative  Current Medications, Allergies, Past Medical History, Past Surgical History, Family History and Social History were reviewed in [facility] electronic medical record.      Objective:   Physical Exam  BP (!) 102/50 (BP Location: Left Arm, Patient Position: Sitting, Cuff Size: Normal)   Pulse 76   Ht 4' 11.5" (1.511 m) Comment: height measured without shoes  Wt 135 lb 8 oz (61.5 kg)   LMP  08/09/2017   BMI 26.91 kg/m   Gen: awake, alert, NAD HEENT: anicteric, op clear CV: RRR, no mrg Pulm: CTA b/l Abd: soft, NT/ND, +BS throughout Ext: no c/c/e Neuro: nonfocal       Assessment & Plan:  31 year old female with a past medical history of H. pylori gastritis, anemia, anxiety and depression who was seen in follow-up.  1.  H. pylori gastritis -- H. pylori gastritis is felt to explain her epigastric pain and nausea.  Fortunately her appetite has improved and she has been able to gain weight.  I suspect it was the metronidazole component of the Pylera that she could not tolerate.  I am going to treat her with different therapy as previously she was treated unsuccessfully with amoxicillin and clarithromycin. --Resume omeprazole 20 mg twice daily --Treat with levofloxacin 500 mg once daily and amoxicillin 1 g twice daily times 14 days --discontinue PPI therapy after 14 days --Check H. pylori stool antigen 1 month after antibiotics and off PPI therapy to confirm eradication -- Return to the office if persistent symptoms after therapy and eradication of H. Pylori  2.  Diarrhea --resolved at this point.  Likely IBS related as she was going through significant stress in her life when she was initially seen some of these issues have also improved recently.  15 minutes spent with the patient today. Greater than 50% was spent in counseling and coordination of care with the patient

## 2017-09-06 ENCOUNTER — Ambulatory Visit: Payer: Medicaid Other | Admitting: Physician Assistant

## 2017-09-20 DIAGNOSIS — Z113 Encounter for screening for infections with a predominantly sexual mode of transmission: Secondary | ICD-10-CM | POA: Diagnosis not present

## 2017-09-20 DIAGNOSIS — Z114 Encounter for screening for human immunodeficiency virus [HIV]: Secondary | ICD-10-CM | POA: Diagnosis not present

## 2017-09-21 ENCOUNTER — Other Ambulatory Visit: Payer: Medicaid Other

## 2017-09-21 ENCOUNTER — Ambulatory Visit: Payer: Medicaid Other | Admitting: Family

## 2017-09-30 ENCOUNTER — Telehealth: Payer: Self-pay | Admitting: Obstetrics & Gynecology

## 2017-09-30 NOTE — Telephone Encounter (Signed)
Patient's mother [place] SinkRita is calling to schedule an appointment for her daughter. [place] SinkRita states her daughter is "still having problems and passing clots" DPR on file to tal with mom.

## 2017-09-30 NOTE — Telephone Encounter (Signed)
Left message to call Schae Cando at [phone]. 

## 2017-09-30 NOTE — Telephone Encounter (Signed)
Spoke with patient' smother [place] SinkRita, okay per ROI who states that the patient is havng abdominal bloating and pelvic pain. States that her menses just ended and that she was passing a lot of blood clots. Not currently having any bleeding. Feels pain is related to ovarian cysts. Advised patient will need to be seen for further evaluation with our office or ER. Mother tried to contact the patient while on the line with no answer. Mother does not know the patient's schedule and requests I contact the patient. Call to the patient Left message to call Monalisa Bayless at [phone].  Routing to Dr.Miller as Lorain ChildesFYI.

## 2017-10-05 NOTE — Telephone Encounter (Signed)
I think ok to close encounter as pt has not returned phone call.

## 2017-10-06 ENCOUNTER — Telehealth: Payer: Self-pay | Admitting: *Deleted

## 2017-10-06 NOTE — Telephone Encounter (Signed)
Spoke with patient. Reports LMP 09/27/17, reports bleeding for 2 days with 2 large clots, "a little bigger than ping pong ball", followed by 3 days of spotting. Denies bleeding currently.   Reports continued abdominal and pelvic discomfort with sitting, feels better with lying down, has not worsened.   Reports decreased libido and vaginal dryness.   Reports lightheadedness with menses, states this has resolved. Also started medication prescribed by Dr. Rhea BeltonPyrtle, unsure if this was cause.   OV scheduled with Dr. Hyacinth MeekerMiller for 10/14/17 at 11:30am on 10/14/17, ER precautions provided and reviewed. Advised will review with Dr. Hyacinth MeekerMiller and return call with any additional recommendations. Patient verbalizes understanding and is agreeable.   Dr. Hyacinth MeekerMiller- any additional recommendations?

## 2017-10-06 NOTE — Telephone Encounter (Signed)
Forwarding to nursing supervisor for adjusting pt's appt due to scheduling concerns.

## 2017-10-07 NOTE — Telephone Encounter (Signed)
Spoke with patient. Patient requests a mid morning appointment so that her children will be in school. Appointment moved to 10/24/2017 at 10:30 am with Dr.Miller. Patient is agreeable to date and time.   Routing to provider for final review. Patient agreeable to disposition. Will close encounter.

## 2017-10-12 ENCOUNTER — Ambulatory Visit: Payer: Medicaid Other | Admitting: Family

## 2017-10-12 ENCOUNTER — Other Ambulatory Visit: Payer: Medicaid Other

## 2017-10-14 ENCOUNTER — Ambulatory Visit: Payer: Self-pay | Admitting: Obstetrics & Gynecology

## 2017-10-22 ENCOUNTER — Other Ambulatory Visit: Payer: Self-pay | Admitting: Physician Assistant

## 2017-10-22 DIAGNOSIS — R112 Nausea with vomiting, unspecified: Secondary | ICD-10-CM

## 2017-10-24 ENCOUNTER — Ambulatory Visit: Payer: Medicaid Other | Admitting: Obstetrics & Gynecology

## 2017-10-24 ENCOUNTER — Encounter: Payer: Self-pay | Admitting: Obstetrics & Gynecology

## 2017-10-24 ENCOUNTER — Other Ambulatory Visit: Payer: Self-pay

## 2017-10-24 VITALS — BP 94/60 | HR 60 | Resp 14 | Ht 59.5 in | Wt 132.0 lb

## 2017-10-24 DIAGNOSIS — D500 Iron deficiency anemia secondary to blood loss (chronic): Secondary | ICD-10-CM

## 2017-10-24 DIAGNOSIS — Z8719 Personal history of other diseases of the digestive system: Secondary | ICD-10-CM

## 2017-10-24 DIAGNOSIS — N926 Irregular menstruation, unspecified: Secondary | ICD-10-CM | POA: Diagnosis not present

## 2017-10-24 DIAGNOSIS — Z8711 Personal history of peptic ulcer disease: Secondary | ICD-10-CM

## 2017-10-24 LAB — POCT URINE PREGNANCY: Preg Test, Ur: NEGATIVE

## 2017-10-24 MED ORDER — TRANEXAMIC ACID 650 MG PO TABS: 1300.0000 mg | ORAL_TABLET | Freq: Three times a day (TID) | ORAL | 0 refills | Status: DC

## 2017-10-24 NOTE — Progress Notes (Addendum)
GYNECOLOGY  VISIT  CC:   Abdominal pain, menorrhagia, dyspareunia  HPI: 32 y.o. [id number] Married [place] female here for follow up regarding abdominal pain, heavy menstrual flows, iron deficiency anemia and gastric ulcer.  Was able to finish the antibiotics for H. Pylori gastritis.  Is going to have testing again in a few weeks and then will follow up with Dr. Rhea BeltonPyrtle if symptoms occur again.  Reports bloating is all gone.  Has occasional nausea but this is better.  Denies emesis.  Had constipation with medication but stool is back to normal.  Stools are more solid and formed.  So happy with being able to eat and tolerate many more foods.    Having some lower cramping intercourse.  Felt very dry while she was on the antibiotics.  Was tested at [facility] for [facility] in December.  Vaginal testing, anal testing and oral testing was all negative.    Cycles are last 4-5.  Typical flow is 3 days.  Flow can be very heavy with clots during these three days.  Has considered options for treatment.  Not really interested in hormonal therapy.  GYNECOLOGIC HISTORY: Patient's last menstrual period was 10/22/2017. Contraception: Tubal ligation  Menopausal hormone therapy: none  Patient Active Problem List   Diagnosis Date Noted  . IDA (iron deficiency anemia) 05/19/2017  . Epigastric pain 04/26/2017  . Cyst of right ovary 04/26/2017  . Macrocytic anemia 04/26/2017  . Herpes genitalia 04/08/2011    Past Medical History:  Diagnosis Date  . Allergy   . Anemia   . Anxiety   . Depression   . Ectopic pregnancy, tubal   . GERD (gastroesophageal reflux disease)   . H. pylori infection   . IBS (irritable bowel syndrome)   . Internal hemorrhoids   . Ovarian cyst bil  . Trichomonas infection     Past Surgical History:  Procedure Laterality Date  . TUBAL LIGATION      MEDS:   Current Outpatient Medications on File Prior to Visit  Medication Sig Dispense Refill  . omeprazole (PRILOSEC) 20 MG  capsule Take 1 capsule (20 mg total) 2 (two) times daily before a meal by mouth. 28 capsule 0  . ondansetron (ZOFRAN ODT) 4 MG disintegrating tablet Take 1 tablet (4 mg total) by mouth every 8 (eight) hours as needed for nausea or vomiting. 10 tablet 0  . dicyclomine (BENTYL) 20 MG tablet Take 1 tablet (20 mg total) by mouth 2 (two) times daily. (Patient not taking: Reported on 10/24/2017) 60 tablet 0  . DULoxetine (CYMBALTA) 20 MG capsule Take 1 capsule (20 mg total) by mouth daily. (Patient not taking: Reported on 10/24/2017) 30 capsule 3  . folic acid (FOLVITE) 1 MG tablet Take 1 tablet (1 mg total) by mouth daily. (Patient not taking: Reported on 10/24/2017) 30 tablet 4   No current facility-administered medications on file prior to visit.     ALLERGIES: Adhesive [tape]; Citrus; and Tomato  Family History  Problem Relation Age of Onset  . Endometriosis Mother   . Anemia Maternal Grandmother   . Clotting disorder Maternal Grandmother   . Thyroid disease Maternal Grandmother     SH:  Single, non smoker  Review of Systems  Gastrointestinal: Positive for constipation, diarrhea and nausea.  Genitourinary:       Breast Pain Loss of sexual interest  Pain with intercourse Painful periods  Menstrual cycle changes  Musculoskeletal: Positive for myalgias.  Neurological: Positive for headaches.  All other systems reviewed  and are negative.   PHYSICAL EXAMINATION:    BP 94/60 (BP Location: Right Arm, Patient Position: Sitting, Cuff Size: Normal)   Pulse 60   Resp 14   Ht 4' 11.5" (1.511 m)   Wt 132 lb (59.9 kg)   LMP 10/22/2017   BMI 26.21 kg/m     General appearance: alert, cooperative and appears stated age Neck: no adenopathy, supple, symmetrical, trachea midline and thyroid normal to inspection and palpation CV:  Regular rate and rhythm Lungs:  clear to auscultation, no wheezes, rales or rhonchi, symmetric air entry Abdomen: soft, non-tender; bowel sounds normal; no masses,  no  organomegaly  Pelvic: External genitalia:  no lesions              Urethra:  normal appearing urethra with no masses, tenderness or lesions              Bartholins and Skenes: normal                 Vagina: normal appearing vagina with normal color and discharge, no lesions              Cervix: no lesions              Bimanual Exam:  Uterus:  normal size, contour, position, consistency, mobility, non-tender              Adnexa: no mass, fullness, tenderness              Anus:  normal sphincter tone, no lesions  Chaperone was present for exam.  Assessment: H/o gastric ulcer, symptoms much improved Severe idiopathic menorrhagia resulting in iron deficiency Iron deficiency anemia, received one iron transfusion but did not follow up for blood work or second transfusion.  Plan: Pt does not want to be on hormonal therapy and she does not want to use a Mirena IUD.  Has undergone tubal ligation.  Will try Lysteda 650mg , two tabs every 8 hours.  Rx to pharmacy. Ferritin, iron level, TIBC, CBC obtained today.  ~30 minutes spent with patient >50% of time was in face to face discussion of above.

## 2017-10-25 LAB — CBC WITH DIFFERENTIAL/PLATELET
BASOS ABS: 0 10*3/uL (ref 0.0–0.2)
Basos: 1 %
EOS (ABSOLUTE): 0.1 10*3/uL (ref 0.0–0.4)
EOS: 2 %
HEMATOCRIT: 32.9 % — AB (ref 34.0–46.6)
HEMOGLOBIN: 11.3 g/dL (ref 11.1–15.9)
Immature Grans (Abs): 0 10*3/uL (ref 0.0–0.1)
Immature Granulocytes: 0 %
LYMPHS ABS: 2.4 10*3/uL (ref 0.7–3.1)
Lymphs: 48 %
MCH: 33.3 pg — AB (ref 26.6–33.0)
MCHC: 34.3 g/dL (ref 31.5–35.7)
MCV: 97 fL (ref 79–97)
MONOCYTES: 5 %
MONOS ABS: 0.2 10*3/uL (ref 0.1–0.9)
NEUTROS ABS: 2.1 10*3/uL (ref 1.4–7.0)
Neutrophils: 44 %
Platelets: 253 10*3/uL (ref 150–379)
RBC: 3.39 x10E6/uL — AB (ref 3.77–5.28)
RDW: 12.6 % (ref 12.3–15.4)
WBC: 4.9 10*3/uL (ref 3.4–10.8)

## 2017-10-25 LAB — FERRITIN: Ferritin: 118 ng/mL (ref 15–150)

## 2017-11-05 LAB — SPECIMEN STATUS REPORT

## 2017-11-05 LAB — IRON AND TIBC
IRON SATURATION: 28 % (ref 15–55)
IRON: 72 ug/dL (ref 27–159)
Total Iron Binding Capacity: 258 ug/dL (ref 250–450)
UIBC: 186 ug/dL (ref 131–425)

## 2017-11-10 ENCOUNTER — Other Ambulatory Visit: Payer: Self-pay

## 2017-11-10 ENCOUNTER — Ambulatory Visit (INDEPENDENT_AMBULATORY_CARE_PROVIDER_SITE_OTHER): Payer: Medicaid Other

## 2017-11-10 ENCOUNTER — Ambulatory Visit (INDEPENDENT_AMBULATORY_CARE_PROVIDER_SITE_OTHER): Payer: Medicaid Other | Admitting: Obstetrics & Gynecology

## 2017-11-10 ENCOUNTER — Telehealth: Payer: Self-pay

## 2017-11-10 VITALS — BP 100/60 | HR 64 | Resp 16

## 2017-11-10 DIAGNOSIS — R1032 Left lower quadrant pain: Secondary | ICD-10-CM | POA: Diagnosis not present

## 2017-11-10 DIAGNOSIS — N83202 Unspecified ovarian cyst, left side: Secondary | ICD-10-CM

## 2017-11-10 NOTE — Progress Notes (Signed)
32 y.o. [id number] Married [place] female here for pelvic ultrasound due to LLQ pain that has bene present for the last couple of days.  Has been more intense at times but, overall, GI symptoms are much, much better.  LLQ pain is actually better now than when she first called.  Has not tried Lysteda yet.  I'd really like her to give this a try for three moths before making any other decisions.  Patient's last menstrual period was 10/22/2017.  Contraception: BTL  Findings:  UTERUS: 8.8 x 5.0 x 4.4cm EMS:8.449mm ADNEXA: Left ovary: 2.7 x 1.9 x 2.2cm with 1.7 cm collapsed orpus luteal yst, No        Right ovary: 2.8 x 1.4 x 2.3cm CUL DE SAC: no free fluid  Discussion:  I think ultrasound gives cause for current pain she is having.  She continues to be interested in hysterectomy.  Pt has no interest in hormonal therapy--piils, progesterone only, IUD (for example) and had not tried [place] yet.  She will these next few months and give up an update  Assessment:  LLQ pain likely from corpus luteal cyst H/O menorrhagia with subsequent iron deficiency, has received iron transfusion.  Has not tried Lysteda yet.  Plan:  She will call in next two to three months to give update.  Understanding dosing of Lysteda.  Pt knows to call if anything changes.  ~15 minutes spent with patient >50% of time was in face to face discussion of above.

## 2017-11-10 NOTE — Telephone Encounter (Signed)
Previously routed to Dr.Miller for advised on date placement. Spoke with patient. Patient states that she is having ongoing moderate to severe pain in her LLQ. Denies any bleeding. Advised she will need a PUS in office with Dr.Miller. Appointment scheduled for today at 4 pm. Patient is agreeable to date and time.  Notes recorded by Jerene BearsMiller, Mary S, MD on 11/07/2017 at 9:29 AM EST She will PUS so routing to triage to schedule. ------  Notes recorded by Loreta AveMorales, Reina C, CMA on 11/07/2017 at 8:48 AM EST Pt notified. Verbalized understanding. Patient states she has been in a lot of discomfort and pain on her LLQ since last week . No bleeding. Taking zofran for nausea. LMP 1/5-1/10. Can come in today or tomorrow if needed.  Please advise. ------  Routing to provider for final review. Patient agreeable to disposition. Will close encounter.

## 2017-11-16 ENCOUNTER — Encounter: Payer: Self-pay | Admitting: Obstetrics & Gynecology

## 2017-11-21 ENCOUNTER — Encounter: Payer: Self-pay | Admitting: Obstetrics & Gynecology

## 2017-11-21 ENCOUNTER — Telehealth: Payer: Self-pay | Admitting: Obstetrics & Gynecology

## 2017-11-21 ENCOUNTER — Other Ambulatory Visit: Payer: Self-pay

## 2017-11-21 ENCOUNTER — Ambulatory Visit (INDEPENDENT_AMBULATORY_CARE_PROVIDER_SITE_OTHER): Payer: Medicaid Other | Admitting: Obstetrics & Gynecology

## 2017-11-21 VITALS — BP 108/56 | HR 62 | Resp 16 | Wt 127.4 lb

## 2017-11-21 DIAGNOSIS — R102 Pelvic and perineal pain: Secondary | ICD-10-CM | POA: Diagnosis not present

## 2017-11-21 DIAGNOSIS — Z202 Contact with and (suspected) exposure to infections with a predominantly sexual mode of transmission: Secondary | ICD-10-CM

## 2017-11-21 DIAGNOSIS — N898 Other specified noninflammatory disorders of vagina: Secondary | ICD-10-CM

## 2017-11-21 NOTE — Telephone Encounter (Signed)
Spoke with patient. Patient states that she started her menses Friday. Reports bleeding was not as heavy as it normally is. Has been taking Lysteda. States that her cramping is severe. Took Ibuprofen yesterday with little relief. Has not taken any medication today. Reports she can hardly walk due to pain from cramps. Advised will need to be seen for further evaluation. Patient is agreeable. Appointment scheduled for 11/21/2017 at 2 pm with Dr.Miller. Patient is agreeable to date and time.  Routing to provider for final review. Patient agreeable to disposition. Will close encounter.

## 2017-11-21 NOTE — Telephone Encounter (Signed)
Patient says she has been in extreme pain since yesterday.

## 2017-11-21 NOTE — Progress Notes (Signed)
GYNECOLOGY  VISIT  CC:   Pain with menses  HPI: 32 y.o. [id number] African American female here for complaint of pelvic pain and severe cramping with menses this month.  Took Lysteda this month and bleeding was improved however cramping was much worse this month.  Does not want to continue Lysteda as she does not feel like she can tolerate the pain with cycles if it is going to be like this.  Denies SOB or palpitations.  Has taken some anti-inflammatories with mild improvement.  Having regular bowel movements--formed and not constipation.  Bladder function is normal.  Reports this cycle has some odor with it as well.  Would like evaluation.  We have previously discussed treatment for her menorrhagia.  She is not interested in oral contraceptives or an IUD.  It appears that she is going to fail the last that a trial with the severe cramping she has had this month.  She has had her tubes tied and does not want to childbearing.  She reports she is ready to proceed with definitive management.  She has completed her GI evaluation and her abdominal bloating as well as abnormal bowel function has resolved, I feel that this is a reasonable request.  Her mother is with her today.  We discussed the type of surgery including a total laparoscopic hysterectomy bilateral salpingectomy.  Procedure discussed with patient.  Hospital stay, recovery and pain management all discussed.  Risks discussed including but not limited to bleeding, 1% risk of receiving a  transfusion, infection, 3-4% risk of bowel/bladder/ureteral/vascular injury discussed as well as possible need for additional surgery if injury does occur discussed.  DVT/PE and rare risk of death discussed.  My actual complications with prior surgeries discussed.  Vaginal cuff dehiscence discussed.  Hernia formation discussed.  Positioning and incision locations discussed.  Patient aware if pathology abnormal she may need additional treatment.  All questions answered.   She is ready to proceed with scheduling.  GYNECOLOGIC HISTORY: Patient's last menstrual period was 11/17/2017. Contraception: BTL  Patient Active Problem List   Diagnosis Date Noted  . IDA (iron deficiency anemia) 05/19/2017  . Epigastric pain 04/26/2017  . Macrocytic anemia 04/26/2017  . Herpes genitalia 04/08/2011    Past Medical History:  Diagnosis Date  . Allergy   . Anemia   . Anxiety   . Depression   . Ectopic pregnancy, tubal   . GERD (gastroesophageal reflux disease)   . H. pylori infection   . IBS (irritable bowel syndrome)   . Internal hemorrhoids   . Ovarian cyst bil  . Trichomonas infection     Past Surgical History:  Procedure Laterality Date  . TUBAL LIGATION      MEDS:   Current Outpatient Medications on File Prior to Visit  Medication Sig Dispense Refill  . dicyclomine (BENTYL) 20 MG tablet Take 1 tablet (20 mg total) by mouth 2 (two) times daily as needed for spasms. 30 tablet 0  . DULoxetine (CYMBALTA) 20 MG capsule Take 1 capsule (20 mg total) by mouth daily. 30 capsule 3  . folic acid (FOLVITE) 1 MG tablet Take 1 tablet (1 mg total) by mouth daily. 30 tablet 4  . omeprazole (PRILOSEC) 20 MG capsule Take 1 capsule (20 mg total) 2 (two) times daily before a meal by mouth. 28 capsule 0  . ondansetron (ZOFRAN ODT) 4 MG disintegrating tablet Take 1 tablet (4 mg total) by mouth every 8 (eight) hours as needed for nausea or vomiting. 10 tablet 0  .  tranexamic acid (LYSTEDA) 650 MG TABS tablet Take 2 tablets (1,300 mg total) by mouth 3 (three) times daily. 30 tablet 0   No current facility-administered medications on file prior to visit.     ALLERGIES: Adhesive [tape]; Citrus; and Tomato  Family History  Problem Relation Age of Onset  . Endometriosis Mother   . Anemia Maternal Grandmother   . Clotting disorder Maternal Grandmother   . Thyroid disease Maternal Grandmother     SH:  Single, smoker  Review of Systems  Constitutional: Negative.    Respiratory: Negative.   Cardiovascular: Negative.   Gastrointestinal: Positive for abdominal pain (severe cramping). Negative for constipation, diarrhea, heartburn, nausea and vomiting.  Genitourinary: Negative for dysuria, frequency and urgency.       Severe pelvic cramping    PHYSICAL EXAMINATION:    BP (!) 108/56 (BP Location: Right Arm, Patient Position: Sitting, Cuff Size: Normal)   Pulse 62   Resp 16   Wt 127 lb 6.4 oz (57.8 kg)   LMP 11/17/2017   BMI 25.30 kg/m     General appearance: alert, cooperative and appears stated age CV:  Regular rate and rhythm Lungs:  clear to auscultation, no wheezes, rales or rhonchi, symmetric air entry Abdomen: soft, non-tender; bowel sounds normal; no masses,  no organomegaly  Pelvic: External genitalia:  no lesions              Urethra:  normal appearing urethra with no masses, tenderness or lesions              Bartholins and Skenes: normal                 Vagina: normal appearing vagina with blood and vaginal odor present, no lesions              Cervix: no lesions              Bimanual Exam:  Uterus:  normal size, contour, position, consistency, mobility, non-tender              Adnexa: no mass, fullness, tenderness   Chaperone was present for exam.  Assessment: Severe dysmenorrhea H/O iron deficiency anemia H/O menorrhagia Vaginal odor  Plan: CBC with diff obtained today Vaginitis testing performed.  Pt will be called with results. Will proceed with surgical planning for TLH/bilateral salpingectomy, possible oophorectomy,cystoscopy   ~30 minutes spent with patient >50% of time was in face to face discussion of above.

## 2017-11-22 ENCOUNTER — Encounter: Payer: Self-pay | Admitting: Obstetrics & Gynecology

## 2017-11-22 LAB — CBC WITH DIFFERENTIAL/PLATELET
Basophils Absolute: 0 10*3/uL (ref 0.0–0.2)
Basos: 0 %
EOS (ABSOLUTE): 0.1 10*3/uL (ref 0.0–0.4)
EOS: 2 %
HEMATOCRIT: 32.3 % — AB (ref 34.0–46.6)
HEMOGLOBIN: 10.9 g/dL — AB (ref 11.1–15.9)
IMMATURE GRANULOCYTES: 0 %
Immature Grans (Abs): 0 10*3/uL (ref 0.0–0.1)
Lymphocytes Absolute: 2.8 10*3/uL (ref 0.7–3.1)
Lymphs: 59 %
MCH: 33.4 pg — ABNORMAL HIGH (ref 26.6–33.0)
MCHC: 33.7 g/dL (ref 31.5–35.7)
MCV: 99 fL — ABNORMAL HIGH (ref 79–97)
Monocytes Absolute: 0.3 10*3/uL (ref 0.1–0.9)
Monocytes: 5 %
NEUTROS PCT: 34 %
Neutrophils Absolute: 1.6 10*3/uL (ref 1.4–7.0)
Platelets: 246 10*3/uL (ref 150–379)
RBC: 3.26 x10E6/uL — AB (ref 3.77–5.28)
RDW: 12.3 % (ref 12.3–15.4)
WBC: 4.8 10*3/uL (ref 3.4–10.8)

## 2017-11-23 LAB — NUSWAB VAGINITIS PLUS (VG+)
BVAB 2: HIGH Score — AB
CANDIDA ALBICANS, NAA: NEGATIVE
CHLAMYDIA TRACHOMATIS, NAA: NEGATIVE
Candida glabrata, NAA: NEGATIVE
NEISSERIA GONORRHOEAE, NAA: NEGATIVE
Trich vag by NAA: NEGATIVE

## 2017-11-24 ENCOUNTER — Other Ambulatory Visit: Payer: Self-pay | Admitting: *Deleted

## 2017-11-24 ENCOUNTER — Other Ambulatory Visit: Payer: Self-pay | Admitting: Obstetrics & Gynecology

## 2017-11-24 MED ORDER — METRONIDAZOLE 500 MG PO TABS: 500.0000 mg | ORAL_TABLET | Freq: Two times a day (BID) | ORAL | 0 refills | Status: DC

## 2017-11-28 ENCOUNTER — Telehealth: Payer: Self-pay

## 2017-11-28 NOTE — Telephone Encounter (Signed)
Spoke with patient and reviewed surgical date and pre-op instructions along with pre/post op appointments.

## 2017-12-15 NOTE — Patient Instructions (Addendum)
Jennifer GrumblesShantee R Combs  12/15/2017      Your procedure is scheduled on 12-26-17  [facility]  At 5:30 A.M.  Call this number if you have problems the morning of [phone]             OUR ADDRESS IS [address] , [facility]              [facility]    Remember:  Do not eat food or drink liquids after midnight.  Take these medicines the morning of surgery with A SIP OF WATER: None-pt's preference  Do not wear jewelry, make-up or nail polish.  Do not wear lotions, powders, or perfumes, or deoderant.  Do not shave 48 hours prior to surgery.  Men may shave face and neck.  Do not bring valuables to the hospital.  [facility] is not responsible for any belongings or valuables.  Contacts, dentures or bridgework may not be worn into surgery.  Leave your suitcase in the car.  After surgery it may be brought to your room.  For patients admitted to the hospital, discharge time will be determined by your treatment team.   Special instructions: Bring Medication in Original Pill Bottle Please read over the following fact sheets that you were given.    [place] - Preparing for Surgery Before surgery, you can play an important role.  Because skin is not sterile, your skin needs to be as free of germs as possible.  You can reduce the number of germs on your skin by washing with CHG (chlorahexidine gluconate) soap before surgery.  CHG is an antiseptic cleaner which kills germs and bonds with the skin to continue killing germs even after washing. Please DO NOT use if you have an allergy to CHG or antibacterial soaps.  If your skin becomes reddened/irritated stop using the CHG and inform your nurse when you arrive at Short Stay. Do not shave (including legs and underarms) for at least 48 hours prior to the first CHG shower.  You may shave your face/neck. Please follow these instructions carefully:  1.  Shower with CHG Soap the night  before surgery and the  morning of Surgery.  2.  If you choose to wash your hair, wash your hair first as usual with your  normal  shampoo.  3.  After you shampoo, rinse your hair and body thoroughly to remove the  shampoo.                           4.  Use CHG as you would any other liquid soap.  You can apply chg directly  to the skin and wash                       Gently with a scrungie or clean washcloth.  5.  Apply the CHG Soap to your body ONLY FROM THE NECK DOWN.   Do not use on face/ open                           Wound or open sores. Avoid contact with eyes, ears mouth and genitals (private parts).                       Wash face,  Genitals (private parts) with your normal soap.  6.  Wash thoroughly, paying special attention to the area where your surgery  will be performed.  7.  Thoroughly rinse your body with warm water from the neck down.  8.  DO NOT shower/wash with your normal soap after using and rinsing off  the CHG Soap.                9.  Pat yourself dry with a clean towel.            10.  Wear clean pajamas.            11.  Place clean sheets on your bed the night of your first shower and do not  sleep with pets. Day of Surgery : Do not apply any lotions/deodorants the morning of surgery.  Please wear clean clothes to the hospital/surgery center.  FAILURE TO FOLLOW THESE INSTRUCTIONS MAY RESULT IN THE CANCELLATION OF YOUR SURGERY PATIENT SIGNATURE_________________________________  NURSE SIGNATURE__________________________________  ________________________________________________________________________

## 2017-12-16 ENCOUNTER — Telehealth: Payer: Self-pay | Admitting: Obstetrics & Gynecology

## 2017-12-16 ENCOUNTER — Ambulatory Visit: Payer: Medicaid Other | Admitting: Obstetrics & Gynecology

## 2017-12-16 NOTE — Telephone Encounter (Addendum)
Patient called and cancelled her surgical consult appointment with Dr. Hyacinth MeekerMiller today due to "still recovering from the flu." She also has a daughter home sick with her. She rescheduled to Tuesday, 12/20/17 at 8:00 AM. Routing to Kennon RoundsSally to be sure this new appointment will work in relation to her upcoming surgery.  Cc: Dr. Hyacinth MeekerMiller for FYI.

## 2017-12-16 NOTE — Telephone Encounter (Signed)
Call to patient. Advised of need to move surgery to 12-26-17 due to provider jury duty. Patient agreeable and will proceed with surgery on 12-26-17 at 0730.  Will keep pre-op appointment on 12-20-17.  Routing to provider for final review. Patient agreeable to disposition. Will close encounter.

## 2017-12-20 ENCOUNTER — Encounter: Payer: Self-pay | Admitting: Obstetrics & Gynecology

## 2017-12-20 ENCOUNTER — Ambulatory Visit (INDEPENDENT_AMBULATORY_CARE_PROVIDER_SITE_OTHER): Payer: Medicaid Other | Admitting: Obstetrics & Gynecology

## 2017-12-20 VITALS — BP 110/70 | HR 76 | Resp 16 | Wt 131.0 lb

## 2017-12-20 DIAGNOSIS — D500 Iron deficiency anemia secondary to blood loss (chronic): Secondary | ICD-10-CM

## 2017-12-20 DIAGNOSIS — N926 Irregular menstruation, unspecified: Secondary | ICD-10-CM

## 2017-12-20 DIAGNOSIS — N921 Excessive and frequent menstruation with irregular cycle: Secondary | ICD-10-CM

## 2017-12-20 NOTE — Progress Notes (Addendum)
32 y.o. [id number] MarriedAfrican American female here for discussion of upcoming procedure on 12/26/17.  Total laparoscopic hysterectomy with bilateral salpingectomy with cystoscopy planned due to severe and idiopathic menorrhagia with associated iron deficiency anemia.  Ultrasound has been performed without significant abnormal findings.  Due to significant anemia, she has been referred to hematology and has received iron infusions.  Will likely need additional infusions even after surgery to finally normalized hemoglobin and iron stores.  Pt is just sick and tired of bleeding and is ready to be completely done with this.  Although she has undergone a BTL in the past, we did discuss that a hysterectomy would make her unable to be pregnant ever again in the future.  One of her children has "needs" and she is absolutely sure she does not want any future child bearing.  She has no concerns about this at all.  Alternatives were reviewed.  She has no interest in OCPs, Depo Provera, or IUD use.  She does not want to take any hormones at all, if possible.  Procedure discussed with patient.  Hospital stay, recovery and pain management all discussed.  Risks discussed including but not limited to bleeding, 1% risk of receiving a  transfusion, infection, 3-4% risk of bowel/bladder/ureteral/vascular injury discussed as well as possible need for additional surgery if injury does occur discussed.  DVT/PE and rare risk of death discussed.  My actual complications with prior surgeries discussed.  Vaginal cuff dehiscence discussed.  Hernia formation discussed.  Positioning and incision locations discussed.  Patient aware if pathology abnormal she may need additional treatment.  All questions answered.      Ob Hx:   Patient's last menstrual period was 12/13/2017.          Sexually active: Yes.   Birth control: bilateral tubal ligation Last pap: 04/04/17 Neg. HR HPV:neg  Last MMG: n/a Tobacco: yes, socially   Past Surgical  History:  Procedure Laterality Date  . TUBAL LIGATION      Past Medical History:  Diagnosis Date  . Allergy   . Anemia   . Anxiety   . Depression   . Ectopic pregnancy, tubal   . GERD (gastroesophageal reflux disease)   . H. pylori infection   . IBS (irritable bowel syndrome)   . Internal hemorrhoids   . Ovarian cyst bil  . Trichomonas infection     Allergies: Adhesive [tape]; Citrus; and Tomato  Current Outpatient Medications  Medication Sig Dispense Refill  . dicyclomine (BENTYL) 20 MG tablet Take 1 tablet (20 mg total) by mouth 2 (two) times daily as needed for spasms. 30 tablet 0  . DULoxetine (CYMBALTA) 20 MG capsule Take 1 capsule (20 mg total) by mouth daily. 30 capsule 3  . ondansetron (ZOFRAN ODT) 4 MG disintegrating tablet Take 1 tablet (4 mg total) by mouth every 8 (eight) hours as needed for nausea or vomiting. 10 tablet 0   No current facility-administered medications for this visit.     ROS: A comprehensive ROS was negative except for bleeding issues.  Exam:    BP 110/70 (BP Location: Right Arm, Patient Position: Sitting, Cuff Size: Normal)   Pulse 76   Resp 16   Wt 131 lb (59.4 kg)   LMP 12/13/2017   BMI 26.02 kg/m   General appearance: alert and cooperative Head: Normocephalic, without obvious abnormality, atraumatic Neck: no adenopathy, supple, symmetrical, trachea midline and thyroid not enlarged, symmetric, no tenderness/mass/nodules Lungs: clear to auscultation bilaterally Heart: regular rate and rhythm, S1,  S2 normal, no murmur, click, rub or gallop Abdomen: soft, non-tender; bowel sounds normal; no masses,  no organomegaly Extremities: extremities normal, atraumatic, no cyanosis or edema Skin: Skin color, texture, turgor normal. No rashes or lesions Lymph nodes: Cervical, supraclavicular, and axillary nodes normal. no inguinal nodes palpated Neurologic: Grossly normal  Pelvic: External genitalia:  no lesions              Urethra: normal  appearing urethra with no masses, tenderness or lesions              Bartholins and Skenes: Bartholin's, Urethra, Skene's normal                 Vagina: normal appearing vagina with normal color and discharge, no lesions              Cervix: normal appearance              Pap taken: No.        Bimanual Exam:  Uterus:  uterus is normal size, shape, consistency and nontender                                      Adnexa:    normal adnexa in size, nontender and no masses                                      Rectovaginal: Deferred                                      Anus:  normal sphincter tone, no lesions  A: Severe idiopathic menorrhagia with associated iron deficiency anemia H/O GI ulcer that has healed and significantly improved GI function and pain    P:  TLH, bilateral salpingectomy, cystoscopy planned Medications/Vitamins reviewed. Hysterectomy brochure given for pre and post op instructions.

## 2017-12-21 ENCOUNTER — Other Ambulatory Visit: Payer: Self-pay

## 2017-12-21 ENCOUNTER — Encounter (HOSPITAL_COMMUNITY)
Admission: RE | Admit: 2017-12-21 | Discharge: 2017-12-21 | Disposition: A | Discharge status: Home / Self Care | Payer: Medicaid Other | Source: Ambulatory Visit | Attending: Obstetrics & Gynecology | Admitting: Obstetrics & Gynecology

## 2017-12-21 ENCOUNTER — Encounter (HOSPITAL_COMMUNITY): Payer: Self-pay

## 2017-12-21 DIAGNOSIS — Z01812 Encounter for preprocedural laboratory examination: Secondary | ICD-10-CM | POA: Insufficient documentation

## 2017-12-21 HISTORY — DX: Other specified postprocedural states: Z98.890

## 2017-12-21 HISTORY — DX: Other specified postprocedural states: R11.2

## 2017-12-21 LAB — CBC
HCT: 29.7 % — ABNORMAL LOW (ref 36.0–46.0)
Hemoglobin: 9.8 g/dL — ABNORMAL LOW (ref 12.0–15.0)
MCH: 33.8 pg (ref 26.0–34.0)
MCHC: 33 g/dL (ref 30.0–36.0)
MCV: 102.4 fL — AB (ref 78.0–100.0)
PLATELETS: 373 10*3/uL (ref 150–400)
RBC: 2.9 MIL/uL — ABNORMAL LOW (ref 3.87–5.11)
RDW: 12.7 % (ref 11.5–15.5)
WBC: 5.3 10*3/uL (ref 4.0–10.5)

## 2017-12-21 LAB — PREGNANCY, URINE: Preg Test, Ur: NEGATIVE

## 2017-12-21 LAB — ABO/RH: ABO/RH(D): O POS

## 2017-12-21 NOTE — Progress Notes (Signed)
12-21-17 CBC result routed to Dr. Hyacinth MeekerMiller for review.

## 2017-12-23 ENCOUNTER — Other Ambulatory Visit: Payer: Self-pay | Admitting: Obstetrics & Gynecology

## 2017-12-25 NOTE — Anesthesia Preprocedure Evaluation (Signed)
Anesthesia Evaluation  Patient identified by MRN, date of birth, ID band Patient awake    Reviewed: Allergy & Precautions, NPO status , Patient's Chart, lab work & pertinent test results  History of Anesthesia Complications (+) PONV and history of anesthetic complications  Airway Mallampati: II  TM Distance: >3 FB Neck ROM: Full    Dental no notable dental hx.    Pulmonary Current Smoker,    Pulmonary exam normal breath sounds clear to auscultation       Cardiovascular negative cardio ROS Normal cardiovascular exam Rhythm:Regular Rate:Normal     Neuro/Psych PSYCHIATRIC DISORDERS Anxiety Depression negative neurological ROS     GI/Hepatic negative GI ROS, Neg liver ROS, GERD  Medicated,  Endo/Other  negative endocrine ROS  Renal/GU negative Renal ROS  negative genitourinary   Musculoskeletal negative musculoskeletal ROS (+)   Abdominal   Peds negative pediatric ROS (+)  Hematology negative hematology ROS (+) anemia ,   Anesthesia Other Findings   Reproductive/Obstetrics negative OB ROS                             Anesthesia Physical Anesthesia Plan  ASA: II  Anesthesia Plan: General   Post-op Pain Management:    Induction: Intravenous  PONV Risk Score and Plan: 4 or greater and Treatment may vary due to age or medical condition, Dexamethasone, Ondansetron, Scopolamine patch - Pre-op and Midazolam  Airway Management Planned: Oral ETT  Additional Equipment:   Intra-op Plan:   Post-operative Plan: Extubation in OR  Informed Consent: I have reviewed the patients History and Physical, chart, labs and discussed the procedure including the risks, benefits and alternatives for the proposed anesthesia with the patient or authorized representative who has indicated his/her understanding and acceptance.     Plan Discussed with: CRNA, Surgeon and Anesthesiologist  Anesthesia Plan  Comments: ( )       Anesthesia Quick Evaluation

## 2017-12-26 ENCOUNTER — Other Ambulatory Visit: Payer: Self-pay

## 2017-12-26 ENCOUNTER — Encounter (HOSPITAL_BASED_OUTPATIENT_CLINIC_OR_DEPARTMENT_OTHER)
Admission: RE | Disposition: A | Discharge status: Home / Self Care | Payer: Self-pay | Source: Ambulatory Visit | Attending: Obstetrics & Gynecology

## 2017-12-26 ENCOUNTER — Ambulatory Visit (HOSPITAL_BASED_OUTPATIENT_CLINIC_OR_DEPARTMENT_OTHER): Payer: Medicaid Other | Admitting: Anesthesiology

## 2017-12-26 ENCOUNTER — Ambulatory Visit (HOSPITAL_BASED_OUTPATIENT_CLINIC_OR_DEPARTMENT_OTHER)
Admission: RE | Admit: 2017-12-26 | Discharge: 2017-12-27 | Disposition: A | Discharge status: Home / Self Care | Payer: Medicaid Other | Source: Ambulatory Visit | Attending: Obstetrics & Gynecology | Admitting: Obstetrics & Gynecology

## 2017-12-26 ENCOUNTER — Encounter (HOSPITAL_BASED_OUTPATIENT_CLINIC_OR_DEPARTMENT_OTHER): Payer: Self-pay | Admitting: *Deleted

## 2017-12-26 DIAGNOSIS — Z79899 Other long term (current) drug therapy: Secondary | ICD-10-CM | POA: Insufficient documentation

## 2017-12-26 DIAGNOSIS — D509 Iron deficiency anemia, unspecified: Secondary | ICD-10-CM | POA: Diagnosis not present

## 2017-12-26 DIAGNOSIS — Z9851 Tubal ligation status: Secondary | ICD-10-CM | POA: Insufficient documentation

## 2017-12-26 DIAGNOSIS — F329 Major depressive disorder, single episode, unspecified: Secondary | ICD-10-CM | POA: Insufficient documentation

## 2017-12-26 DIAGNOSIS — F17210 Nicotine dependence, cigarettes, uncomplicated: Secondary | ICD-10-CM | POA: Insufficient documentation

## 2017-12-26 DIAGNOSIS — K589 Irritable bowel syndrome without diarrhea: Secondary | ICD-10-CM | POA: Insufficient documentation

## 2017-12-26 DIAGNOSIS — K219 Gastro-esophageal reflux disease without esophagitis: Secondary | ICD-10-CM | POA: Insufficient documentation

## 2017-12-26 DIAGNOSIS — N920 Excessive and frequent menstruation with regular cycle: Secondary | ICD-10-CM | POA: Diagnosis not present

## 2017-12-26 DIAGNOSIS — Z888 Allergy status to other drugs, medicaments and biological substances status: Secondary | ICD-10-CM | POA: Insufficient documentation

## 2017-12-26 DIAGNOSIS — F419 Anxiety disorder, unspecified: Secondary | ICD-10-CM | POA: Insufficient documentation

## 2017-12-26 DIAGNOSIS — R102 Pelvic and perineal pain: Secondary | ICD-10-CM | POA: Diagnosis not present

## 2017-12-26 HISTORY — PX: TOTAL LAPAROSCOPIC HYSTERECTOMY WITH SALPINGECTOMY: SHX6742

## 2017-12-26 HISTORY — PX: CYSTOSCOPY: SHX5120

## 2017-12-26 LAB — TYPE AND SCREEN
ABO/RH(D): O POS
Antibody Screen: NEGATIVE

## 2017-12-26 LAB — HEMOGLOBIN: HEMOGLOBIN: 9.9 g/dL — AB (ref 12.0–15.0)

## 2017-12-26 LAB — POCT HEMOGLOBIN-HEMACUE: Hemoglobin: 10.2 g/dL — ABNORMAL LOW (ref 12.0–15.0)

## 2017-12-26 SURGERY — HYSTERECTOMY, TOTAL, LAPAROSCOPIC, WITH SALPINGECTOMY
Anesthesia: General | Site: Bladder

## 2017-12-26 MED ORDER — HEMOSTATIC AGENTS (NO CHARGE) OPTIME
TOPICAL | Status: DC | PRN
  Administered 2017-12-26: 1 via TOPICAL

## 2017-12-26 MED ORDER — MIDAZOLAM HCL 5 MG/5ML IJ SOLN
INTRAMUSCULAR | Status: DC | PRN
  Administered 2017-12-26: 2 mg via INTRAVENOUS

## 2017-12-26 MED ORDER — SODIUM CHLORIDE 0.9 % IJ SOLN
INTRAMUSCULAR | Status: AC
  Filled 2017-12-26: qty 10

## 2017-12-26 MED ORDER — SIMETHICONE 80 MG PO CHEW
80.0000 mg | CHEWABLE_TABLET | Freq: Four times a day (QID) | ORAL | Status: DC | PRN
  Filled 2017-12-26: qty 1

## 2017-12-26 MED ORDER — FENTANYL CITRATE (PF) 100 MCG/2ML IJ SOLN
INTRAMUSCULAR | Status: DC | PRN
  Administered 2017-12-26 (×2): 25 ug via INTRAVENOUS
  Administered 2017-12-26: 50 ug via INTRAVENOUS
  Administered 2017-12-26 (×2): 25 ug via INTRAVENOUS
  Administered 2017-12-26: 50 ug via INTRAVENOUS

## 2017-12-26 MED ORDER — FENTANYL CITRATE (PF) 100 MCG/2ML IJ SOLN
INTRAMUSCULAR | Status: AC
  Filled 2017-12-26: qty 2

## 2017-12-26 MED ORDER — ONDANSETRON HCL 4 MG/2ML IJ SOLN
INTRAMUSCULAR | Status: AC
  Filled 2017-12-26: qty 2

## 2017-12-26 MED ORDER — KETOROLAC TROMETHAMINE 30 MG/ML IJ SOLN
INTRAMUSCULAR | Status: AC
  Filled 2017-12-26: qty 1

## 2017-12-26 MED ORDER — ROCURONIUM BROMIDE 100 MG/10ML IV SOLN
INTRAVENOUS | Status: DC | PRN
  Administered 2017-12-26: 10 mg via INTRAVENOUS
  Administered 2017-12-26: 50 mg via INTRAVENOUS

## 2017-12-26 MED ORDER — PANTOPRAZOLE SODIUM 40 MG IV SOLR
40.0000 mg | Freq: Every day | INTRAVENOUS | Status: DC
  Filled 2017-12-26: qty 40

## 2017-12-26 MED ORDER — OXYCODONE-ACETAMINOPHEN 5-325 MG PO TABS
1.0000 | ORAL_TABLET | ORAL | Status: DC | PRN
  Administered 2017-12-26 – 2017-12-27 (×3): 1 via ORAL
  Filled 2017-12-26: qty 1

## 2017-12-26 MED ORDER — PROPOFOL 10 MG/ML IV BOLUS
INTRAVENOUS | Status: AC
  Filled 2017-12-26: qty 20

## 2017-12-26 MED ORDER — ONDANSETRON HCL 4 MG/2ML IJ SOLN
4.0000 mg | Freq: Once | INTRAMUSCULAR | Status: AC | PRN
  Administered 2017-12-26: 4 mg via INTRAVENOUS
  Filled 2017-12-26: qty 2

## 2017-12-26 MED ORDER — MIDAZOLAM HCL 2 MG/2ML IJ SOLN
INTRAMUSCULAR | Status: AC
  Filled 2017-12-26: qty 2

## 2017-12-26 MED ORDER — SUGAMMADEX SODIUM 200 MG/2ML IV SOLN
INTRAVENOUS | Status: AC
  Filled 2017-12-26: qty 2

## 2017-12-26 MED ORDER — DEXTROSE-NACL 5-0.45 % IV SOLN
INTRAVENOUS | Status: DC
  Administered 2017-12-26: 12:00:00 via INTRAVENOUS
  Filled 2017-12-26: qty 1000

## 2017-12-26 MED ORDER — SODIUM CHLORIDE 0.9 % IR SOLN
Status: DC | PRN
  Administered 2017-12-26: 3000 mL

## 2017-12-26 MED ORDER — MORPHINE SULFATE (PF) 2 MG/ML IV SOLN
2.0000 mg | INTRAVENOUS | Status: DC | PRN
  Filled 2017-12-26: qty 1

## 2017-12-26 MED ORDER — ONDANSETRON HCL 4 MG/2ML IJ SOLN
INTRAMUSCULAR | Status: DC | PRN
  Administered 2017-12-26: 4 mg via INTRAVENOUS

## 2017-12-26 MED ORDER — KETOROLAC TROMETHAMINE 30 MG/ML IJ SOLN
30.0000 mg | Freq: Four times a day (QID) | INTRAMUSCULAR | Status: DC
  Filled 2017-12-26: qty 1

## 2017-12-26 MED ORDER — FENTANYL CITRATE (PF) 100 MCG/2ML IJ SOLN
25.0000 ug | INTRAMUSCULAR | Status: DC | PRN
  Administered 2017-12-26 (×2): 50 ug via INTRAVENOUS
  Filled 2017-12-26: qty 1

## 2017-12-26 MED ORDER — SODIUM CHLORIDE 0.9 % IV SOLN
INTRAVENOUS | Status: DC | PRN
  Administered 2017-12-26: 30 mL

## 2017-12-26 MED ORDER — ROCURONIUM BROMIDE 10 MG/ML (PF) SYRINGE
PREFILLED_SYRINGE | INTRAVENOUS | Status: AC
  Filled 2017-12-26: qty 5

## 2017-12-26 MED ORDER — OXYCODONE HCL 5 MG/5ML PO SOLN
5.0000 mg | Freq: Once | ORAL | Status: DC | PRN
  Filled 2017-12-26: qty 5

## 2017-12-26 MED ORDER — ACETAMINOPHEN 160 MG/5ML PO SOLN
325.0000 mg | ORAL | Status: DC | PRN
  Filled 2017-12-26: qty 20.3

## 2017-12-26 MED ORDER — KETOROLAC TROMETHAMINE 30 MG/ML IJ SOLN: INTRAMUSCULAR | Status: DC | PRN

## 2017-12-26 MED ORDER — SUGAMMADEX SODIUM 200 MG/2ML IV SOLN
INTRAVENOUS | Status: DC | PRN
  Administered 2017-12-26: 200 mg via INTRAVENOUS

## 2017-12-26 MED ORDER — SODIUM CHLORIDE 0.9 % IV SOLN
2.0000 g | INTRAVENOUS | Status: AC
  Administered 2017-12-26: 2 g via INTRAVENOUS
  Filled 2017-12-26: qty 2

## 2017-12-26 MED ORDER — OXYCODONE-ACETAMINOPHEN 5-325 MG PO TABS
ORAL_TABLET | ORAL | Status: AC
  Filled 2017-12-26: qty 1

## 2017-12-26 MED ORDER — STERILE WATER FOR IRRIGATION IR SOLN
Status: DC | PRN
  Administered 2017-12-26: 3000 mL

## 2017-12-26 MED ORDER — LACTATED RINGERS IV SOLN
INTRAVENOUS | Status: DC
  Administered 2017-12-26: 06:00:00 via INTRAVENOUS
  Filled 2017-12-26: qty 1000

## 2017-12-26 MED ORDER — PROPOFOL 10 MG/ML IV BOLUS
INTRAVENOUS | Status: DC | PRN
  Administered 2017-12-26: 50 mg via INTRAVENOUS
  Administered 2017-12-26: 150 mg via INTRAVENOUS

## 2017-12-26 MED ORDER — DULOXETINE HCL 20 MG PO CPEP
20.0000 mg | ORAL_CAPSULE | Freq: Once | ORAL | Status: AC
  Administered 2017-12-26: 20 mg via ORAL
  Filled 2017-12-26 (×2): qty 1

## 2017-12-26 MED ORDER — MENTHOL 3 MG MT LOZG
1.0000 | LOZENGE | OROMUCOSAL | Status: DC | PRN
  Filled 2017-12-26: qty 9

## 2017-12-26 MED ORDER — KETOROLAC TROMETHAMINE 30 MG/ML IJ SOLN
30.0000 mg | Freq: Four times a day (QID) | INTRAMUSCULAR | Status: DC
  Administered 2017-12-26 – 2017-12-27 (×3): 30 mg via INTRAVENOUS
  Filled 2017-12-26: qty 1

## 2017-12-26 MED ORDER — KETOROLAC TROMETHAMINE 30 MG/ML IJ SOLN
30.0000 mg | Freq: Once | INTRAMUSCULAR | Status: DC | PRN
  Filled 2017-12-26: qty 1

## 2017-12-26 MED ORDER — ACETAMINOPHEN 325 MG PO TABS
325.0000 mg | ORAL_TABLET | ORAL | Status: DC | PRN
  Filled 2017-12-26: qty 2

## 2017-12-26 MED ORDER — KETOROLAC TROMETHAMINE 30 MG/ML IJ SOLN
INTRAMUSCULAR | Status: DC | PRN
  Administered 2017-12-26: 30 mg via INTRAVENOUS

## 2017-12-26 MED ORDER — PANTOPRAZOLE SODIUM 40 MG IV SOLR
INTRAVENOUS | Status: AC
  Filled 2017-12-26: qty 40

## 2017-12-26 MED ORDER — ACETAMINOPHEN 325 MG PO TABS
650.0000 mg | ORAL_TABLET | ORAL | Status: DC | PRN
  Filled 2017-12-26: qty 2

## 2017-12-26 MED ORDER — DEXTROSE-NACL 5-0.45 % IV SOLN
INTRAVENOUS | Status: DC
  Filled 2017-12-26: qty 1000

## 2017-12-26 MED ORDER — MEPERIDINE HCL 25 MG/ML IJ SOLN
6.2500 mg | INTRAMUSCULAR | Status: DC | PRN
  Filled 2017-12-26: qty 1

## 2017-12-26 MED ORDER — OXYCODONE HCL 5 MG PO TABS
5.0000 mg | ORAL_TABLET | Freq: Once | ORAL | Status: DC | PRN
  Filled 2017-12-26: qty 1

## 2017-12-26 MED ORDER — DEXAMETHASONE SODIUM PHOSPHATE 4 MG/ML IJ SOLN
INTRAMUSCULAR | Status: DC | PRN
  Administered 2017-12-26: 10 mg via INTRAVENOUS

## 2017-12-26 MED ORDER — ALUM & MAG HYDROXIDE-SIMETH 200-200-20 MG/5ML PO SUSP
30.0000 mL | ORAL | Status: DC | PRN
  Filled 2017-12-26: qty 30

## 2017-12-26 MED ORDER — DEXAMETHASONE SODIUM PHOSPHATE 10 MG/ML IJ SOLN
INTRAMUSCULAR | Status: AC
  Filled 2017-12-26: qty 1

## 2017-12-26 MED ORDER — ONDANSETRON HCL 4 MG/2ML IJ SOLN: INTRAMUSCULAR | Status: DC | PRN

## 2017-12-26 MED ORDER — LIDOCAINE HCL (CARDIAC) 20 MG/ML IV SOLN
INTRAVENOUS | Status: DC | PRN
  Administered 2017-12-26: 60 mg via INTRAVENOUS
  Administered 2017-12-26: 40 mg via INTRAVENOUS

## 2017-12-26 MED ORDER — CEFOTETAN DISODIUM-DEXTROSE 2-2.08 GM-%(50ML) IV SOLR
INTRAVENOUS | Status: AC
  Filled 2017-12-26: qty 50

## 2017-12-26 MED ORDER — BUPIVACAINE HCL (PF) 0.25 % IJ SOLN
INTRAMUSCULAR | Status: DC | PRN
  Administered 2017-12-26 (×2): 10 mL

## 2017-12-26 SURGICAL SUPPLY — 54 items
APPLICATOR ARISTA FLEXITIP XL (MISCELLANEOUS) ×4 IMPLANT
CABLE HIGH FREQUENCY MONO STRZ (ELECTRODE) IMPLANT
CONT SPEC 4OZ CLIKSEAL STRL BL (MISCELLANEOUS) ×4 IMPLANT
COVER BACK TABLE 80X110 HD (DRAPES) ×4 IMPLANT
COVER MAYO STAND STRL (DRAPES) ×4 IMPLANT
COVER SURGICAL LIGHT HANDLE (MISCELLANEOUS) IMPLANT
DERMABOND ADVANCED (GAUZE/BANDAGES/DRESSINGS) ×2
DERMABOND ADVANCED .7 DNX12 (GAUZE/BANDAGES/DRESSINGS) ×2 IMPLANT
DILATOR CANAL MILEX (MISCELLANEOUS) IMPLANT
DRSG COVADERM PLUS 2X2 (GAUZE/BANDAGES/DRESSINGS) IMPLANT
DRSG OPSITE POSTOP 3X4 (GAUZE/BANDAGES/DRESSINGS) IMPLANT
DURAPREP 26ML APPLICATOR (WOUND CARE) ×4 IMPLANT
GLOVE BIOGEL PI IND STRL 7.0 (GLOVE) ×4 IMPLANT
GLOVE BIOGEL PI INDICATOR 7.0 (GLOVE) ×4
GLOVE ECLIPSE 6.5 STRL STRAW (GLOVE) ×8 IMPLANT
GOWN STRL REUS W/TWL XL LVL3 (GOWN DISPOSABLE) ×8 IMPLANT
HEMOSTAT ARISTA ABSORB 3G PWDR (MISCELLANEOUS) ×4 IMPLANT
LIGASURE VESSEL 5MM BLUNT TIP (ELECTROSURGICAL) ×4 IMPLANT
NEEDLE INSUFFLATION 120MM (ENDOMECHANICALS) ×4 IMPLANT
NS IRRIG 1000ML POUR BTL (IV SOLUTION) ×4 IMPLANT
OCCLUDER COLPOPNEUMO (BALLOONS) ×4 IMPLANT
PACK LAPAROSCOPY BASIN (CUSTOM PROCEDURE TRAY) ×4 IMPLANT
PACK TRENDGUARD 450 HYBRID PRO (MISCELLANEOUS) ×2 IMPLANT
POUCH LAPAROSCOPIC INSTRUMENT (MISCELLANEOUS) ×4 IMPLANT
PROTECTOR NERVE ULNAR (MISCELLANEOUS) ×8 IMPLANT
SCISSORS LAP 5X35 DISP (ENDOMECHANICALS) IMPLANT
SET IRRIG TUBING LAPAROSCOPIC (IRRIGATION / IRRIGATOR) ×4 IMPLANT
SET IRRIG Y TYPE TUR BLADDER L (SET/KITS/TRAYS/PACK) ×4 IMPLANT
SET TRI-LUMEN FLTR TB AIRSEAL (TUBING) ×4 IMPLANT
SHEARS HARMONIC ACE PLUS 36CM (ENDOMECHANICALS) ×4 IMPLANT
SOLUTION ELECTROLUBE (MISCELLANEOUS) IMPLANT
SUT DVC VLOC 180 0 12IN GS21 (SUTURE) ×8
SUT VIC AB 0 CT1 27 (SUTURE) ×4
SUT VIC AB 0 CT1 27XBRD ANBCTR (SUTURE) ×4 IMPLANT
SUT VICRYL 0 UR6 27IN ABS (SUTURE) IMPLANT
SUT VICRYL 4-0 PS2 18IN ABS (SUTURE) ×4 IMPLANT
SUT VLOC 180 0 9IN  GS21 (SUTURE) ×2
SUT VLOC 180 0 9IN GS21 (SUTURE) ×2 IMPLANT
SUTURE DVC VLC 180 0 12IN GS21 (SUTURE) ×4 IMPLANT
SYR 50ML LL SCALE MARK (SYRINGE) ×8 IMPLANT
SYRINGE 10CC LL (SYRINGE) ×4 IMPLANT
SYSTEM CARTER THOMASON II (TROCAR) IMPLANT
TIP UTERINE 5.1X6CM LAV DISP (MISCELLANEOUS) IMPLANT
TIP UTERINE 6.7X10CM GRN DISP (MISCELLANEOUS) ×4 IMPLANT
TIP UTERINE 6.7X6CM WHT DISP (MISCELLANEOUS) IMPLANT
TIP UTERINE 6.7X8CM BLUE DISP (MISCELLANEOUS) IMPLANT
TOWEL OR 17X24 6PK STRL BLUE (TOWEL DISPOSABLE) ×8 IMPLANT
TRAY FOLEY CATH SILVER 14FR (SET/KITS/TRAYS/PACK) ×4 IMPLANT
TRENDGUARD 450 HYBRID PRO PACK (MISCELLANEOUS) ×4
TROCAR ADV FIXATION 5X100MM (TROCAR) ×4 IMPLANT
TROCAR PORT AIRSEAL 5X120 (TROCAR) ×4 IMPLANT
TROCAR XCEL NON BLADE 8MM B8LT (ENDOMECHANICALS) ×4 IMPLANT
TROCAR XCEL NON-BLD 5MMX100MML (ENDOMECHANICALS) ×4 IMPLANT
WARMER LAPAROSCOPE (MISCELLANEOUS) ×4 IMPLANT

## 2017-12-26 NOTE — Op Note (Signed)
12/26/2017  9:41 AM  PATIENT:  Jennifer GrumblesShantee R Parcher  32 y.o. female  PRE-OPERATIVE DIAGNOSIS:  Severe idiopathic menorrhagia/metrorrmenorrhagia, iron deficiency anemia, history of recent iron transfusions, h/o BTL  POST-OPERATIVE DIAGNOSIS:  Same  PROCEDURE:  Procedure(s): TOTAL LAPAROSCOPIC HYSTERECTOMY WITH SALPINGECTOMY CYSTOSCOPY  SURGEON:  [name] Parthenia Tellefsen  ASSISTANTS: Dr. Gertie ExonJill Jertson   ANESTHESIA:   general  ESTIMATED BLOOD LOSS: 50 mL  BLOOD ADMINISTERED:none   FLUIDS: 700cc LR  UOP: 600cc clear UOPD  SPECIMEN:  Uterus, cervix, bilateral fallopian tubes  DISPOSITION OF SPECIMEN:  PATHOLOGY  FINDINGS: normal pelvic, prior tubal ligation with absent portion of each tube, normal upper abdomen  DESCRIPTION OF OPERATION: Patient is taken to the operating room. She is placed in the supine position. She is a running IV in place. Informed consent was present on the chart. SCDs on her lower extremities and functioning properly. Patient was positioned while she was awake.  Her legs were placed in the low lithotomy position in [place] stirrups. Her arms were tucked by the side.  General endotracheal anesthesia was administered by the anesthesia staff without difficulty. Dr. Tacy Duraddono, anesthesia, oversaw case.  Time out performed.    Dura prep was then used to prep the abdomen and Betadine was used to prep the inner thighs, perineum and vagina. Once 3 minutes had past the patient was draped in a normal standard fashion. The legs were lifted to the high lithotomy position. The cervix was visualized by placing a heavy weighted speculum in the posterior aspect of the vagina and using a curved Deaver retractor to the retract anteriorly. The anterior lip of the cervix was grasped with single-tooth tenaculum.  The cervix sounded to 10 cm. Pratt dilators were used to dilate the cervix up to a #21. A RUMI uterine manipulator was obtained. A # 10 disposable tip was placed on the RUMI manipulator as well  as a 4.0, silver KOH ring. This was passed through the cervix and the bulb of the disposable tip was inflated with 10 cc of normal saline. There was a good fit of the KOH ring around the cervix. The tenaculum was removed. There is also good manipulation of the uterus. The speculum and retractor were removed as well. A Foley catheter was placed to straight drain.  Clear urine was noted. Legs were lowered to the low lithotomy position and attention was turned the abdomen.  The umbilicus was everted.  A Veress needle was obtained. Syringe of sterile saline was placed on a open Veress needle.  This was passed into the umbilicus until just when the fluid started to drip.  Then low flow CO2 gas was attached the needle and the pneumoperitoneum was achieved without difficulty. Once four liters of gas was in the abdomen the Veress needle was removed and a 5 millimeter non-bladed Optiview trocar and port were passed directly to the abdomen. The laparoscope was then used to confirm intraperitoneal placement. Normal pelvic was noted as well as normal upper abdomen.  Prior tubal ligation has been performed with a portion of each tube missing.  Ovaries were normal.  Locations for RLQ, LLQ, and suprapubic ports were noted by transillumination of the abdominal wall.  0.25% marcaine was used to anesthetize the skin.  8mm skin incision was made in the RLQ and an AirSeal port was placed underdirect visualization of the laparoscope.  Then a 5mm skin incision was made and a 5mm nonbladed trochar and port was placed in the LLQ.  Finally, and 8mm skin  incision was made about 4cm above the pubic symphasis and an 8mm non-bladed port was placed with direct visualization of the laparoscope.  All trochars were removed.    Ureters were identifies.  Attention was turned to the left side. With uterus on stretch the left tube was excised off the ovary and mesosalpinx was dissected to free the tube. Then the left utero-ovarian pedicle was  serially clamped cauterized and incised using the ligasure device. Left round ligament was serially clamped cauterized and incised. The anterior and posterior peritoneum of the inferior leaf of the broad ligament were opened. The beginning of the baldder flap was created.  The bladder was taken down below the level of the KOH ring. The left uterine artery skeletonized and then just superior to the KOH ring, this vessel was serially clamped, cauterized, and incised.  Attention was turned the right side.  The uterus was placed on stretch to the opposite side.  The tube was excised off the ovary using sharp dissection a bipolar cautery.  The mesosalpinx was incised freeing the tube. Then the right uterine ovarian pedicle was serially clamped cauterized and incised. Next the right round ligament was serially clamped cauterized and incised. The anterior posterior peritoneum of the inferiorly for the broad ligament were opened. The anterior peritoneum was carried across to the dissection on the left side. The remainder of the bladder flap was created using sharp dissection. The bladder was well below the level of the KOH ring. The left uterine artery skeletonized. Then the left uterine artery, above the level of the KOH ring, was serially clamped cauterized and incised. The uterus was devascularized at this point.  The colpotomy was performed a starting in the midline and using a harmonic scalpel with the inferior edge of the open blade  This was carried around a circumferential fashion until the vaginal mucosa was completely incised in the specimen was freed.  The specimen was then delivered to the vagina.  A vaginal occlusive device was used to maintain the pneumoperitoneum  Instruments were changed with a needle driver and Kobra graspers.  Using a 12 inch V. lock suture, the cuff was closed by incorporating the anterior and posterior vaginal mucosa in each stitch. This was carried across all the way to the left  corner and a running fashion. Two stitches were brought back towards the midline and the suture was cut flush with the vagina. The needle was brought out the pelvis. The pelvis was irrigated. All pedicles were inspected. No bleeding was noted. Pressure was lowered to 5 to monitor for any bleeding.  None was present.  Then Arista was placed along all pedicles.  Photodocumentation was made.   At this point the procedure was completed.  The remaining instruments were removed.  The ports were removed under direct visualization of the laparoscope and the pneumoperitoneum was relieved.  The patient was taken out of Trendelenburg positioning.  Several deep breaths were given to the patient's trying to remove any gas from the abdomen and finally the midline port was removed.  The skin was then closed with subcuticular stitches of 3-0 Vicryl. The skin was cleansed Dermabond was applied. Attention was then turned the vagina and the cuff was inspected. No bleeding was noted. The anterior posterior vaginal mucosa was incorporated in each stitch. The Foley catheter was removed.  Cystoscopy was performed.  No sutures or bladder injuries were noted.  Ureters were noted with normal urine jets from each one was seen.  Normal bubble  on dome of bladder was noted and entire bladder was visualized.  Foley was left out after the cystoscopic fluid was drained and cystoscope removed.  Sponge, lap, needle, initially counts were correct x2. Patient tolerated the procedure very well. She was awakened from anesthesia, extubated and taken to recovery in stable condition.   COUNTS:  YES  PLAN OF CARE: Transfer to PACU

## 2017-12-26 NOTE — Progress Notes (Signed)
12/26/2017 Pt. C/o nausea. MD paged and made aware only Zofran x 1 prn ordered on MAR. Verbal order received ok to administer Zofran 4 mg IV x 1 as ordered and to add on Phenergan 12.5 mg IV Q4H PRN for nausea and vomiting if Zofran ineffective. Orders placed and enacted. Will continue to closely monitor patient.  Jennifer Combs, Jennifer Combs

## 2017-12-26 NOTE — Anesthesia Procedure Notes (Signed)
Procedure Name: Intubation Date/Time: 12/26/2017 7:36 AM Performed by: Justice Rocher, CRNA Pre-anesthesia Checklist: Patient identified, Emergency Drugs available, Suction available and Patient being monitored Patient Re-evaluated:Patient Re-evaluated prior to induction Oxygen Delivery Method: Circle system utilized Preoxygenation: Pre-oxygenation with 100% oxygen Induction Type: IV induction Ventilation: Mask ventilation without difficulty Laryngoscope Size: Mac and 3 Grade View: Grade II Tube type: Oral Tube size: 7.0 mm Number of attempts: 1 Airway Equipment and Method: Stylet and Oral airway Placement Confirmation: ETT inserted through vocal cords under direct vision,  positive ETCO2 and breath sounds checked- equal and bilateral Tube secured with: Tape Dental Injury: Teeth and Oropharynx as per pre-operative assessment

## 2017-12-26 NOTE — Progress Notes (Signed)
Day of Surgery Procedure(Combs) (LRB): TOTAL LAPAROSCOPIC HYSTERECTOMY WITH SALPINGECTOMY (Bilateral) CYSTOSCOPY (N/A)  Subjective: Patient reports good pain control, no nausea.  Voiding well.  Minimal vaginal spotting.  Has walked.    Objective: I have reviewed patient'Combs vital signs, intake and output, medications and labs. Vitals:   12/26/17 1415 12/26/17 1515  BP: 109/64 116/74  Pulse:    Resp: 18 16  Temp: 97.6 F (36.4 C) 98.6 F (37 C)  SpO2: 100% 100%    Hb: 9.9.  Pre op Hb: 9.8  General: alert and cooperative Resp: clear to auscultation bilaterally Cardio: regular rate and rhythm, S1, S2 normal, no murmur, click, rub or gallop GI: normal findings: soft, mildly distended, +BS and incision: dressings inplace without blood present Extremities: extremities normal, atraumatic, no cyanosis or edema Vaginal Bleeding: minimal  Assessment: Combs/p Procedure(Combs): TOTAL LAPAROSCOPIC HYSTERECTOMY WITH SALPINGECTOMY (Bilateral) CYSTOSCOPY (N/A): stable and progressing well  Plan: Encourage ambulation Advance to PO medication  LOS: 0 days    Jennifer Combs Jennifer Combs 12/26/2017, 6:00 PM

## 2017-12-26 NOTE — Transfer of Care (Signed)
Immediate Anesthesia Transfer of Care Note  Patient: Estelle GrumblesShantee R Panther  Procedure(s) Performed: Procedure(s) (LRB): TOTAL LAPAROSCOPIC HYSTERECTOMY WITH SALPINGECTOMY (Bilateral) CYSTOSCOPY (N/A)  Patient Location: PACU  Anesthesia Type: General  Level of Consciousness: awake, sedated, patient cooperative and responds to stimulation  Airway & Oxygen Therapy: Patient Spontanous Breathing and Patient connected to NCO2  Post-op Assessment: Report given to PACU RN, Post -op Vital signs reviewed and stable and Patient moving all extremities  Post vital signs: Reviewed and stable  Complications: No apparent anesthesia complications

## 2017-12-26 NOTE — Anesthesia Postprocedure Evaluation (Signed)
Anesthesia Post Note  Patient: Jennifer GrumblesShantee R Breden  Procedure(s) Performed: TOTAL LAPAROSCOPIC HYSTERECTOMY WITH SALPINGECTOMY (Bilateral Abdomen) CYSTOSCOPY (N/A Bladder)     Patient location during evaluation: PACU Anesthesia Type: General Level of consciousness: awake and alert Pain management: pain level controlled Vital Signs Assessment: post-procedure vital signs reviewed and stable Respiratory status: spontaneous breathing, nonlabored ventilation, respiratory function stable and patient connected to nasal cannula oxygen Cardiovascular status: blood pressure returned to baseline and stable Postop Assessment: no apparent nausea or vomiting Anesthetic complications: no    Last Vitals:  Vitals:   12/26/17 0950 12/26/17 1000  BP: 123/79 112/72  Pulse: 74 64  Resp: 20 15  Temp: 36.6 C   SpO2: 100% 100%    Last Pain:  Vitals:   12/26/17 0541  TempSrc: Oral                 Caia Lofaro

## 2017-12-26 NOTE — H&P (Signed)
Jennifer GrumblesShantee R Combs is an 32 y.o. female  [phone] MAAF here for definitive surgery of idiopathic severe menorrhagia/metrorrhagia causing iron deficiency anemia.  Hb is 10.2 today.  She has received iron transfusions this year and will likely need additional iron transfusions once surgery is complete.  Procedure, risks, benefits, alternatives have been reviewed.  Pt here and ready to proceed.  Clearly aware she will not longer be able to have children.  Has had prior BTL.  Pertinent Gynecological History: Menses: heavy and irregular, most recent cycle was 10 days Bleeding: metromenorrhagia Contraception: tubal ligation DES exposure: denies Blood transfusions: none Sexually transmitted diseases: past history: trichomoniasis.  Testing 2/19 was negative for GC/Chl, trich Previous GYN Procedures: BTL  Last mammogram: n/a Date: n/a Last pap: normal Date: 6/18 OB History: G6, P3   Menstrual History: Patient's last menstrual period was 12/13/2017 (exact date).    Past Medical History:  Diagnosis Date  . Allergy   . Anemia   . Anxiety   . Depression   . Ectopic pregnancy, tubal   . GERD (gastroesophageal reflux disease)   . H. pylori infection   . IBS (irritable bowel syndrome)   . Internal hemorrhoids   . Ovarian cyst bil  . PONV (postoperative nausea and vomiting)   . Trichomonas infection     Past Surgical History:  Procedure Laterality Date  . TUBAL LIGATION      Family History  Problem Relation Age of Onset  . Endometriosis Mother   . Anemia Maternal Grandmother   . Clotting disorder Maternal Grandmother   . Thyroid disease Maternal Grandmother     Social History:  reports that she has been smoking.  She has been smoking about 0.10 packs per day. she has never used smokeless tobacco. She reports that she uses drugs. Drug: Marijuana. Frequency: 28.00 times per week. She reports that she does not drink alcohol.  Allergies:  Allergies  Allergen Reactions  . Adhesive [Tape]  Other (See Comments)    Adhesive from electrodes Leave burn marks on skin  . Citrus   . Tomato     MAKES MOUTH RAW.    Medications Prior to Admission  Medication Sig Dispense Refill Last Dose  . dicyclomine (BENTYL) 20 MG tablet Take 1 tablet (20 mg total) by mouth 2 (two) times daily as needed for spasms. 30 tablet 0 Past Month at Unknown time  . DULoxetine (CYMBALTA) 20 MG capsule Take 1 capsule (20 mg total) by mouth daily. (Patient taking differently: Take 20 mg by mouth daily. ) 30 capsule 3 Past Week at Unknown time  . ondansetron (ZOFRAN ODT) 4 MG disintegrating tablet Take 1 tablet (4 mg total) by mouth every 8 (eight) hours as needed for nausea or vomiting. 10 tablet 0 Past Week at Unknown time    Review of Systems  All other systems reviewed and are negative.   Blood pressure (!) 104/56, pulse 78, temperature 98.1 F (36.7 C), temperature source Oral, resp. rate 16, height 4\' 11"  (1.499 m), weight 134 lb 12.8 oz (61.1 kg), last menstrual period 12/13/2017, SpO2 100 %. Physical Exam  Constitutional: She is oriented to person, place, and time. She appears well-developed and well-nourished.  Cardiovascular: Normal rate and regular rhythm.  Respiratory: Effort normal and breath sounds normal.  GI: Bowel sounds are normal.  Neurological: She is alert and oriented to person, place, and time.  Skin: Skin is warm and dry.  Psychiatric: She has a normal mood and affect.    No  results found for this or any previous visit (from the past 24 hour(s)).  No results found.  Assessment/Plan: 32 yo G6P3 (ectopic 1, TAB 2) here for definitive treatment of severe idiopathic menorrhagia and metromenorrhagia resulting in iron deficiency anemia.  TLH, bilateral salpingectomy/possible BSO, cystoscopy planned.  Jerene Bears 12/26/2017, 7:16 AM

## 2017-12-27 ENCOUNTER — Encounter (HOSPITAL_BASED_OUTPATIENT_CLINIC_OR_DEPARTMENT_OTHER): Payer: Self-pay | Admitting: Obstetrics & Gynecology

## 2017-12-27 DIAGNOSIS — N920 Excessive and frequent menstruation with regular cycle: Secondary | ICD-10-CM | POA: Diagnosis not present

## 2017-12-27 MED ORDER — PROMETHAZINE HCL 25 MG/ML IJ SOLN
12.5000 mg | INTRAMUSCULAR | Status: DC | PRN
  Filled 2017-12-27: qty 1

## 2017-12-27 MED ORDER — OXYCODONE-ACETAMINOPHEN 5-325 MG PO TABS: 1.0000 | ORAL_TABLET | ORAL | 0 refills | Status: DC | PRN

## 2017-12-27 MED ORDER — OXYCODONE-ACETAMINOPHEN 5-325 MG PO TABS
ORAL_TABLET | ORAL | Status: AC
  Filled 2017-12-27: qty 1

## 2017-12-27 MED ORDER — KETOROLAC TROMETHAMINE 30 MG/ML IJ SOLN
INTRAMUSCULAR | Status: AC
  Filled 2017-12-27: qty 1

## 2017-12-27 MED ORDER — IBUPROFEN 800 MG PO TABS: 800.0000 mg | ORAL_TABLET | Freq: Three times a day (TID) | ORAL | 0 refills | Status: DC | PRN

## 2017-12-27 NOTE — Discharge Instructions (Signed)
Post Op Hysterectomy Instructions Please read the instructions below. Refer to these instructions for the next few weeks. These instructions provide you with general information on caring for yourself after surgery. Your caregiver may also give you specific instructions. While your treatment has been planned according to the most current medical practices available, unavoidable problems sometimes happen. If you have any problems or questions after you leave, please call your caregiver.  HOME CARE INSTRUCTIONS Healing will take time. You will have discomfort, tenderness, swelling and bruising at the operative site for a couple of weeks. This is normal and will get better as time goes on.  Only take over-the-counter or prescription medicines for pain, discomfort or fever as directed by your caregiver.  Do not take aspirin. It can cause bleeding.  Do not drive when taking pain medication.  Follow your caregiver's advice regarding diet, exercise, lifting, driving and general activities.  Resume your usual diet as directed and allowed.  Get plenty of rest and sleep.  Do not douche, use tampons, or have sexual intercourse until your caregiver gives you permission. .  Take your temperature if you feel hot or flushed.  You may shower today when you get home.  No tub bath for one week.   Do not drink alcohol until you are not taking any narcotic pain medications.  Try to have someone home with you for a week or two to help with the household activities.   Be careful over the next two to three weeks with any activities at home that involve lifting, pushing, or pulling.  Listen to your body--if something feels uncomfortable to do, then don't do it. Make sure you and your family understands everything about your operation and recovery.  Walking up stairs is fine. Do not sign any legal documents until you feel normal again.  Keep all your follow-up appointments as recommended by your caregiver.   PLEASE CALL  THE OFFICE IF: There is swelling, redness or increasing pain in the wound area.  Pus is coming from the wound.  You notice a bad smell from the wound or surgical dressing.  You have pain, redness and swelling from the intravenous site.  The wound is breaking open (the edges are not staying together).   You develop pain or bleeding when you urinate.  You develop abnormal vaginal discharge.  You have any type of abnormal reaction or develop an allergy to your medication.  You need stronger pain medication for your pain   SEEK IMMEDIATE MEDICAL CARE: You develop a temperature of 100.5 or higher.  You develop abdominal pain.  You develop chest pain.  You develop shortness of breath.  You pass out.  You develop pain, swelling or redness of your leg.  You develop heavy vaginal bleeding with or without blood clots.   MEDICATIONS: Restart your regular medications BUT wait one week before restarting all vitamins and mineral supplements Use Motrin 800mg every 8 hours for the next several days.  This will help you use less Percocet.  Use the Percocet 5/325 1-2 tabs every 4-6 hours as needed for pain. You may use an over the counter stool softener like Colace or Dulcolax to help with starting a bowel movement.  Start the day after you go home.  Warm liquids, fluids, and ambulation help too.  If you have not had a bowel movement in four days, you need to call the office.  

## 2017-12-28 NOTE — Discharge Summary (Signed)
Physician Discharge Summary  Patient ID: Jennifer Combs MRN: [id number] DOB/AGE: 30/05/1986 30 y.o.  Admit date: 12/26/2017 Discharge date: 12/28/2017  Admission Diagnoses: idiopathic severe menorrhagia, metromenorrhagia, iron deficiency anemia, h/o prior BTL  Discharge Diagnoses:  Active Problems:   Menorrhagia   Discharged Condition: good  Hospital Course: Patient admitted through same day surgery.  She was taken to OR where TLH/bilateral salpingectomy/cystoscopy were performed.  Surgical findings included normal pelvic, evidence of prior BTL.  Surgery was uneventful.  EBL 50cc.  Foley catheter was removed before leaving OR.  Patient transferred to PACU where she was stable and then to 3rd floor for the remainder of her hospitalization.  During her post-op recovery, her vitals and stable and she was AF.  In evening of POD#0, she was able to transition to oral pain medications and regular diet.  She was able to ambulate and she had good pain control.  She was also able to void on her own.  Patient seen both in the evening of POD#0 and AM of POD#1.  In the AM of POD#1, she was without complaint.  Post op hb was 9.9, decreased from 10.2, pre-operatively.  At this point, patient was voiding, walking, passing flatus, having excellent pain control, had no nausea, and minimal vaginal bleeding.  She was ready for D/C.   Consults: None  Significant Diagnostic Studies: labs: post op hb:  9.9  Treatments: surgery: TLH/Bilateral salpingectomy/cystoscopy  Discharge Exam: Blood pressure (!) 91/53, pulse 64, temperature 98.6 F (37 C), temperature source Oral, resp. rate 16, height 4\' 11"  (1.499 m), weight 134 lb 12.8 oz (61.1 kg), last menstrual period 12/13/2017, SpO2 99 %. General appearance: alert, cooperative and no distress Resp: clear to auscultation bilaterally Cardio: regular rate and rhythm, S1, S2 normal, no murmur, click, rub or gallop GI: soft, non-tender; bowel sounds normal; no  masses,  no organomegaly Extremities: extremities normal, atraumatic, no cyanosis or edema Incision/Wound:c/d/i Vaginal bleeding:  none  Disposition: 01-Home or Self Care   Allergies as of 12/27/2017      Reactions   Adhesive [tape] Other (See Comments)   Adhesive from electrodes Leave burn marks on skin   Citrus    Tomato    MAKES MOUTH RAW.      Medication List    TAKE these medications   dicyclomine 20 MG tablet Commonly known as:  BENTYL Take 1 tablet (20 mg total) by mouth 2 (two) times daily as needed for spasms.   DULoxetine 20 MG capsule Commonly known as:  CYMBALTA Take 1 capsule (20 mg total) by mouth daily.   ibuprofen 800 MG tablet Commonly known as:  ADVIL,MOTRIN Take 1 tablet (800 mg total) by mouth every 8 (eight) hours as needed.   ondansetron 4 MG disintegrating tablet Commonly known as:  ZOFRAN ODT Take 1 tablet (4 mg total) by mouth every 8 (eight) hours as needed for nausea or vomiting.   oxyCODONE-acetaminophen 5-325 MG tablet Commonly known as:  PERCOCET/ROXICET Take 1 tablet by mouth every 4 (four) hours as needed for moderate pain or severe pain ((when tolerating fluids)).      Follow-up Information    Jerene BearsMiller, Gusta Marksberry S, MD On 01/05/2018.   Specialty:  Gynecology Why:  appt time 10am Contact information: [address] [place] [address] [phone]           Signed: [name] Graycie Halley 12/28/2017, 8:17 AM

## 2018-01-04 NOTE — Progress Notes (Signed)
Post Operative Visit  Procedure:TOTAL LAPAROSCOPIC HYSTERECTOMY WITH SALPINGECTOMY Days Post-op: 10 days Any bleeding: having bleeding when wiping  Subjective: Denies pain.  Having some spotting.  Has no help at home.  Feels some cramping the last two days.  Tearful today.  Pathology reviewed.  No fevers.  Having normal bowel movements about every other day.  Emptying bladder normally.  Objective: BP 100/60   Pulse 68   Resp 16   Wt 131 lb (59.4 kg)   LMP 12/13/2017 (Exact Date)   BMI 26.46 kg/m   EXAM General: alert, cooperative and no distress Resp: clear to auscultation bilaterally Cardio: regular rate and rhythm, S1, S2 normal, no murmur, click, rub or gallop GI: soft, non-tender; bowel sounds normal; no masses,  no organomegaly Extremities: extremities normal, atraumatic, no cyanosis or edema Vaginal Bleeding: minimal  Gyn: NAEFG, vagina pink and without lesions, small amount of brownish drainage at cuff, mild odor present, tenderness to palpation of cuff, no fullness  Assessment: s/p TLH/bilateral salpingectomy/cystoscopy Cuff cellulitis today  Plan: Start bactrim DS BID x 7 days and flagyl 500mg  bid x 7 days.  Precautions reviewed. Recheck on Monday.

## 2018-01-05 ENCOUNTER — Encounter: Payer: Self-pay | Admitting: Obstetrics & Gynecology

## 2018-01-05 ENCOUNTER — Other Ambulatory Visit: Payer: Self-pay

## 2018-01-05 ENCOUNTER — Ambulatory Visit (INDEPENDENT_AMBULATORY_CARE_PROVIDER_SITE_OTHER): Payer: Medicaid Other | Admitting: Obstetrics & Gynecology

## 2018-01-05 VITALS — BP 100/60 | HR 68 | Resp 16 | Ht 59.5 in | Wt 131.0 lb

## 2018-01-05 DIAGNOSIS — N760 Acute vaginitis: Secondary | ICD-10-CM

## 2018-01-05 DIAGNOSIS — Z9889 Other specified postprocedural states: Secondary | ICD-10-CM

## 2018-01-05 MED ORDER — SULFAMETHOXAZOLE-TRIMETHOPRIM 800-160 MG PO TABS: 1.0000 | ORAL_TABLET | Freq: Two times a day (BID) | ORAL | 0 refills | Status: DC

## 2018-01-05 MED ORDER — METRONIDAZOLE 500 MG PO TABS: 500.0000 mg | ORAL_TABLET | Freq: Two times a day (BID) | ORAL | 0 refills | Status: DC

## 2018-01-09 ENCOUNTER — Telehealth: Payer: Self-pay | Admitting: Obstetrics & Gynecology

## 2018-01-09 ENCOUNTER — Ambulatory Visit: Payer: Medicaid Other | Admitting: Obstetrics & Gynecology

## 2018-01-09 ENCOUNTER — Encounter: Payer: Self-pay | Admitting: Obstetrics & Gynecology

## 2018-01-09 NOTE — Telephone Encounter (Signed)
Called a spoke with patient about her missed appointment with Dr. Hyacinth MeekerMiller this morning. The patient expressed surprise as she forgot and had her days mixed up. She rescheduled to 01/10/18 at 2:30 PM.  The patient also wanted Dr. Hyacinth MeekerMiller to know she has only been taking the Flagyl. She reported the other medication (? Name) was making her nauseous.

## 2018-01-09 NOTE — Progress Notes (Deleted)
Post Operative Visit  Procedure: Total Laparoscopic Hysterectomy with Salpingectomy, Cystoscopy.  Days Post-op: 14  Subjective: ***  Objective: LMP 12/13/2017 (Exact Date)   EXAM General: {Exam; general:16600} Resp: {Exam; lung:16931} Cardio: {Exam; heart:5510} GI: {Exam, [id number]}GI:3041136} Extremities: {Exam; extremity:5109} Vaginal Bleeding: {exam; vaginal bleeding:3041122}  Assessment: s/p ***  Plan: Recheck {NUMBER 1-10:22536} weeks ***

## 2018-01-10 ENCOUNTER — Ambulatory Visit (INDEPENDENT_AMBULATORY_CARE_PROVIDER_SITE_OTHER): Payer: Medicaid Other | Admitting: Obstetrics & Gynecology

## 2018-01-10 ENCOUNTER — Encounter: Payer: Self-pay | Admitting: Obstetrics & Gynecology

## 2018-01-10 VITALS — BP 90/58 | HR 68 | Resp 16 | Ht 59.5 in | Wt 134.0 lb

## 2018-01-10 DIAGNOSIS — N760 Acute vaginitis: Secondary | ICD-10-CM

## 2018-01-10 DIAGNOSIS — Z9889 Other specified postprocedural states: Secondary | ICD-10-CM

## 2018-01-10 MED ORDER — AMOXICILLIN-POT CLAVULANATE 875-125 MG PO TABS: 1.0000 | ORAL_TABLET | Freq: Two times a day (BID) | ORAL | 0 refills | Status: DC

## 2018-01-10 NOTE — Progress Notes (Signed)
Post Operative Visit  Procedure: Total Laparoscopic Hysterectomy with Salpingectomy, cystoscopy  Days Post-op: 15  Subjective: Feeling better.  Has rested.  Still having some vaginal discharge and odor.  No fevers.  Taking the flagyl but stopped the bactrim due to nausea.  Having normal BMs and normal bladder function.  Objective: BP (!) 90/58 (BP Location: Right Arm, Patient Position: Sitting, Cuff Size: Normal)   Pulse 68   Resp 16   Ht 4' 11.5" (1.511 m)   Wt 134 lb (60.8 kg)   LMP 12/13/2017 (Exact Date)   BMI 26.61 kg/m   EXAM General: alert, cooperative and no distress Resp: clear to auscultation bilaterally Cardio: regular rate and rhythm, S1, S2 normal, no murmur, click, rub or gallop GI: soft, non-tender; bowel sounds normal; no masses,  no organomegaly and incision: clean, dry and intact Extremities: extremities normal, atraumatic, no cyanosis or edema Vaginal Bleeding: minimal  GYN:  NAEFG, vaginal without lesions, cuff intact and non tender to palpation, small amount of blood present with mild odor (improved from prior visit)  Assessment: s/p TLH/bilateral salpingectomy/cystoscopy Cuff cellulitis, improved but likely only paritally treated as pt has only been taking one antibiotic  Plan: Will change to Augmentin 875 bid x 7 days.  Rx to pharmacy.  Pt knows to let me know if cannot take this.   Recheck 2 weeks.

## 2018-01-24 ENCOUNTER — Encounter: Payer: Self-pay | Admitting: Obstetrics & Gynecology

## 2018-01-24 ENCOUNTER — Ambulatory Visit (INDEPENDENT_AMBULATORY_CARE_PROVIDER_SITE_OTHER): Payer: Medicaid Other | Admitting: Obstetrics & Gynecology

## 2018-01-24 ENCOUNTER — Other Ambulatory Visit: Payer: Self-pay

## 2018-01-24 VITALS — BP 110/60 | HR 64 | Resp 16 | Ht 59.5 in | Wt 135.0 lb

## 2018-01-24 DIAGNOSIS — Z9889 Other specified postprocedural states: Secondary | ICD-10-CM | POA: Diagnosis not present

## 2018-01-24 DIAGNOSIS — L723 Sebaceous cyst: Secondary | ICD-10-CM

## 2018-01-24 NOTE — Progress Notes (Signed)
Post Operative Visit  Procedure: Total Laparoscopic Hysterectomy with Bilateral Salpingectomy, cystoscopy.  Days Post-op: 29  Subjective: Doing well.  Pain resolved.  Reports she feels great.  Asks about sexual activity.  Advised to wait at least 8 weeks but oral sex is ok.  No vaginal discharge or bleeding.    Having normal bowel and bladder function.  Does have sebaceous cyst we discussed removal of after surgery.  Desires to have this done today.  Consent obtained.  Objective: BP 110/60 (BP Location: Right Arm, Patient Position: Sitting, Cuff Size: Normal)   Pulse 64   Resp 16   Ht 4' 11.5" (1.511 m)   Wt 135 lb (61.2 kg)   LMP 12/13/2017 (Exact Date)   BMI 26.81 kg/m   EXAM General: alert, cooperative and no distress Resp: clear to auscultation bilaterally Cardio: regular rate and rhythm, S1, S2 normal, no murmur, click, rub or gallop GI: soft, non-tender; bowel sounds normal; no masses,  no organomegaly and incision: clean, dry, intact and suture protruding in LLQ incision removed today Extremities: extremities normal, atraumatic, no cyanosis or edema Vaginal Bleeding: none  Gyn:  NAEFG, vaginal without lesions or discharge, cuff healing well, no fullness, non tender  Procedure:  Area for removal cleansed with Betadine x 3.  1.5cc 1% Lidocaine plain instilled beneath lesion.  Incised with #11 blade.  Entire cyst contents and cyst well removed.  Silver nitrated used for excellent hemostasis.  Dressing applied.  Pt tolerated procedure well.  No tissue sent to pathology.  Lesion was <0.5cm.  Assessment: s/p TLH/bilateral salpingectomy, cystoscopy Removal of sebaceous cyst today  Plan: Recheck 4 weeks.

## 2018-01-30 ENCOUNTER — Other Ambulatory Visit: Payer: Self-pay

## 2018-01-30 ENCOUNTER — Inpatient Hospital Stay: Payer: Medicaid Other

## 2018-01-30 ENCOUNTER — Inpatient Hospital Stay: Payer: Medicaid Other | Attending: Family | Admitting: Family

## 2018-01-30 ENCOUNTER — Encounter: Payer: Self-pay | Admitting: Family

## 2018-01-30 VITALS — BP 97/47 | HR 62 | Temp 98.7°F | Resp 16 | Wt 135.0 lb

## 2018-01-30 DIAGNOSIS — D500 Iron deficiency anemia secondary to blood loss (chronic): Secondary | ICD-10-CM | POA: Insufficient documentation

## 2018-01-30 DIAGNOSIS — N920 Excessive and frequent menstruation with regular cycle: Secondary | ICD-10-CM | POA: Diagnosis not present

## 2018-01-30 DIAGNOSIS — Z90710 Acquired absence of both cervix and uterus: Secondary | ICD-10-CM | POA: Insufficient documentation

## 2018-01-30 LAB — CBC WITH DIFFERENTIAL (CANCER CENTER ONLY)
BASOS PCT: 0 %
Basophils Absolute: 0 10*3/uL (ref 0.0–0.1)
EOS ABS: 0.1 10*3/uL (ref 0.0–0.5)
EOS PCT: 3 %
HCT: 30.7 % — ABNORMAL LOW (ref 34.8–46.6)
Hemoglobin: 10.3 g/dL — ABNORMAL LOW (ref 11.6–15.9)
LYMPHS ABS: 2.1 10*3/uL (ref 0.9–3.3)
Lymphocytes Relative: 47 %
MCH: 33.8 pg (ref 26.0–34.0)
MCHC: 33.6 g/dL (ref 32.0–36.0)
MCV: 100.7 fL (ref 81.0–101.0)
MONO ABS: 0.4 10*3/uL (ref 0.1–0.9)
MONOS PCT: 8 %
NEUTROS PCT: 42 %
Neutro Abs: 1.9 10*3/uL (ref 1.5–6.5)
PLATELETS: 179 10*3/uL (ref 145–400)
RBC: 3.05 MIL/uL — ABNORMAL LOW (ref 3.70–5.32)
RDW: 13.1 % (ref 11.1–15.7)
WBC Count: 4.5 10*3/uL (ref 3.9–10.0)

## 2018-01-30 NOTE — Progress Notes (Signed)
Hematology and Oncology Follow Up Visit  Jennifer Combs [id number] 02/13/1986 32 y.o. 01/30/2018   Principle Diagnosis:  Iron deficiency anemia secondary to menorrhagia   Current Therapy:   IV iron as indicated - last received in August 21018 x 2   Interim History:  Jennifer Combs is here today with her mother for follow-up. She is doing well and has recuperated from her hysterectomy in March. Hgb today is 10.3 with an MCV of 100.  She has had no bleeding, no bruising or petechiae.  She had one episodes of numbness and tingling in her fingertips a few weeks ago. No other numbness or tingling. No swelling or tenderness in her extremities.  No fever, chills, n/v, cough, rash, dizziness, SOB, chest pain, palpitations, abdominal pain or changes in bowel or bladder habits. She has maintained a good appetite and is staying well hydrated. Her weight is stable.   ECOG Performance Status: 1 - Symptomatic but completely ambulatory  Medications:  Allergies as of 01/30/2018      Reactions   Adhesive [tape] Other (See Comments)   Adhesive from electrodes Leave burn marks on skin   Citrus    Tomato    MAKES MOUTH RAW.      Medication List        Accurate as of 01/30/18  3:42 PM. Always use your most recent med list.          amoxicillin-clavulanate 875-125 MG tablet Commonly known as:  AUGMENTIN Take 1 tablet by mouth 2 (two) times daily.   dicyclomine 20 MG tablet Commonly known as:  BENTYL Take 1 tablet (20 mg total) by mouth 2 (two) times daily as needed for spasms.   DULoxetine 20 MG capsule Commonly known as:  CYMBALTA Take 1 capsule (20 mg total) by mouth daily.   ibuprofen 800 MG tablet Commonly known as:  ADVIL,MOTRIN Take 1 tablet (800 mg total) by mouth every 8 (eight) hours as needed.   ondansetron 4 MG disintegrating tablet Commonly known as:  ZOFRAN ODT Take 1 tablet (4 mg total) by mouth every 8 (eight) hours as needed for nausea or vomiting.        Allergies:  Allergies  Allergen Reactions  . Adhesive [Tape] Other (See Comments)    Adhesive from electrodes Leave burn marks on skin  . Citrus   . Tomato     MAKES MOUTH RAW.    Past Medical History, Surgical history, Social history, and Family History were reviewed and updated.  Review of Systems: All other 10 point review of systems is negative.   Physical Exam:  vitals were not taken for this visit.   Wt Readings from Last 3 Encounters:  01/24/18 135 lb (61.2 kg)  01/10/18 134 lb (60.8 kg)  01/05/18 131 lb (59.4 kg)    Ocular: Sclerae unicteric, pupils equal, round and reactive to light Ear-nose-throat: Oropharynx clear, dentition fair Lymphatic: No cervical, supraclavicular or axillary adenopathy Lungs no rales or rhonchi, good excursion bilaterally Heart regular rate and rhythm, no murmur appreciated Abd soft, nontender, positive bowel sounds, no liver or spleen tip palpated on exam, no fluid wave  MSK no focal spinal tenderness, no joint edema Neuro: non-focal, well-oriented, appropriate affect Breasts: Deferred   Lab Results  Component Value Date   WBC 4.5 01/30/2018   HGB 9.9 (L) 12/26/2017   HCT 30.7 (L) 01/30/2018   MCV 100.7 01/30/2018   PLT 179 01/30/2018   Lab Results  Component Value Date   FERRITIN 118  10/24/2017   IRON 72 10/24/2017   TIBC 258 10/24/2017   UIBC 186 10/24/2017   IRONPCTSAT 28 10/24/2017   Lab Results  Component Value Date   RETICCTPCT 1.3 05/02/2017   RBC 3.05 (L) 01/30/2018   No results found for: KPAFRELGTCHN, LAMBDASER, KAPLAMBRATIO No results found for: IGGSERUM, IGA, IGMSERUM No results found for: Marda Stalker, SPEI   Chemistry      Component Value Date/Time   NA 140 08/16/2017 1034   K 3.4 (L) 08/16/2017 1034   CL 107 05/31/2017 0221   CL 106 05/18/2017 1310   CO2 24 08/16/2017 1034   BUN 11.8 08/16/2017 1034   CREATININE 0.8 08/16/2017 1034       Component Value Date/Time   CALCIUM 9.2 08/16/2017 1034   ALKPHOS 48 08/16/2017 1034   AST 21 08/16/2017 1034   ALT 12 08/16/2017 1034   BILITOT 0.70 08/16/2017 1034      Impression and Plan: Jennifer Combs is a very pleasant 32 yo African American female with history of iron deficiency anemia secondary to heavy cycles. She had her hysterectomy last month and has recuperated nicely.  We will see what her iron studies show and bring her back in for infusion if needed.  We will plan to see her back in another 3 months for follow-up.  She will contact our office with any questions or concerns. We can certainly see her sooner if need be.   Emeline Gins, NP 4/15/20193:42 PM

## 2018-01-31 LAB — IRON AND TIBC
Iron: 59 ug/dL (ref 41–142)
SATURATION RATIOS: 21 % (ref 21–57)
TIBC: 275 ug/dL (ref 236–444)
UIBC: 217 ug/dL

## 2018-01-31 LAB — FERRITIN: Ferritin: 90 ng/mL (ref 9–269)

## 2018-02-17 ENCOUNTER — Ambulatory Visit (INDEPENDENT_AMBULATORY_CARE_PROVIDER_SITE_OTHER): Payer: Medicaid Other | Admitting: Obstetrics & Gynecology

## 2018-02-17 ENCOUNTER — Encounter: Payer: Self-pay | Admitting: Obstetrics & Gynecology

## 2018-02-17 ENCOUNTER — Other Ambulatory Visit: Payer: Self-pay

## 2018-02-17 VITALS — BP 108/60 | HR 78 | Resp 16 | Ht 59.5 in | Wt 134.0 lb

## 2018-02-17 DIAGNOSIS — Z9889 Other specified postprocedural states: Secondary | ICD-10-CM

## 2018-02-17 NOTE — Progress Notes (Signed)
Post Operative Visit  Procedure: Total Laparoscopic Hysterectomy with Salpingectomy, cystoscopy.  Days Post-op: 53  Subjective: Doing well.  No pain.  GI function is good without any recent bloating.  No vaginal bleeding.  No vaginal discharge.  Has not been SA yet.  Will be 8 weeks post op on Monday.  Energy is good.  Mood is good.  Feels like she is in a really good space.  Went to [place] last weekend to visit a friend.  Was a good trip away.  Objective: BP 108/60 (BP Location: Right Arm, Patient Position: Sitting, Cuff Size: Normal)   Pulse 78   Resp 16   Ht 4' 11.5" (1.511 m)   Wt 134 lb (60.8 kg)   LMP 12/13/2017 (Exact Date)   BMI 26.61 kg/m   EXAM General: alert and no distress Resp: clear to auscultation bilaterally Cardio: regular rate and rhythm, S1, S2 normal, no murmur, click, rub or gallop GI: soft, non-tender; bowel sounds normal; no masses,  no organomegaly and incision: clean, dry and intact Extremities: extremities normal, atraumatic, no cyanosis or edema Vaginal Bleeding: none  Gyn:  NAEFG, vagina without lesions, cuff well healed, no sutures visible, non tender, no masses  Assessment: s/p TLH/bilateral salpingectomy due to iron deficiency anemia  Plan: Pt can resume SA after next Monday but is aware to stop with any pain and wait two weeks before attempting again.  Lubricant encouraged.   Recheck 1 year

## 2018-04-17 ENCOUNTER — Inpatient Hospital Stay: Payer: Medicaid Other

## 2018-04-17 ENCOUNTER — Inpatient Hospital Stay: Payer: Medicaid Other | Attending: Family | Admitting: Family

## 2018-04-24 ENCOUNTER — Telehealth: Payer: Self-pay | Admitting: Obstetrics & Gynecology

## 2018-04-24 NOTE — Telephone Encounter (Signed)
Spoke with patient. S/p TLH 12/26/17. Patient reports abdominal pain on left and right of abdomen at "belly button line". Pain started today, 6/10 on pain scale. Last BM was this morning, describes BM as soft. Abdomen soft. Lower back pain started last night. No recent injuries.   Denies fever/chills, N/V, urinary complainants, vaginal odor/dc. Last seen by GI prior to surgery.   Advised patient may need to schedule with GI or PCP for further evaluation first given location of symptoms. Advised patient will review with Dr. Hyacinth MeekerMiller and return call.  Patient verbalizes understanding.

## 2018-04-24 NOTE — Telephone Encounter (Signed)
Reviewed with Dr. Hyacinth MeekerMiller. Call returned to patient, recommended patient f/u with GI first, return call to office to schedule with Dr. Hyacinth MeekerMiller if GYN recommended for further evaluation.   Patient states "I will just go to the ER to be seen, will take months to be seen at GI", call disconnected by patient.   Routing to Dr. Hyacinth MeekerMiller. Encounter closed.

## 2018-04-24 NOTE — Telephone Encounter (Signed)
Patient states she is about 18 weeks post op and is currently having some cramping.

## 2018-05-10 IMAGING — US US PELVIS COMPLETE
1 series · 13 of 25 positions shown · non-contrast
Comparison: 06/10/2008.

CLINICAL DATA: Pelvic pain.

EXAM:
TRANSABDOMINAL AND TRANSVAGINAL ULTRASOUND OF PELVIS
TECHNIQUE: Both transabdominal and transvaginal ultrasound examinations of the
pelvis were performed. Transabdominal technique was performed for
global imaging of the pelvis including uterus, ovaries, adnexal
regions, and pelvic cul-de-sac. It was necessary to proceed with
endovaginal exam following the transabdominal exam to visualize the
uterus and ovaries.

[Series 1: us pelvis complete · 0.20mm/px · 13 of 98 slices shown]
[im 1/98]
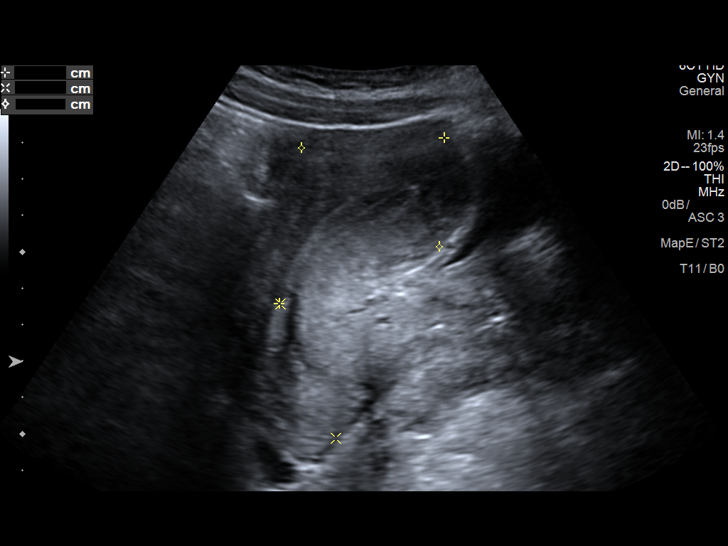
[im 9/98]
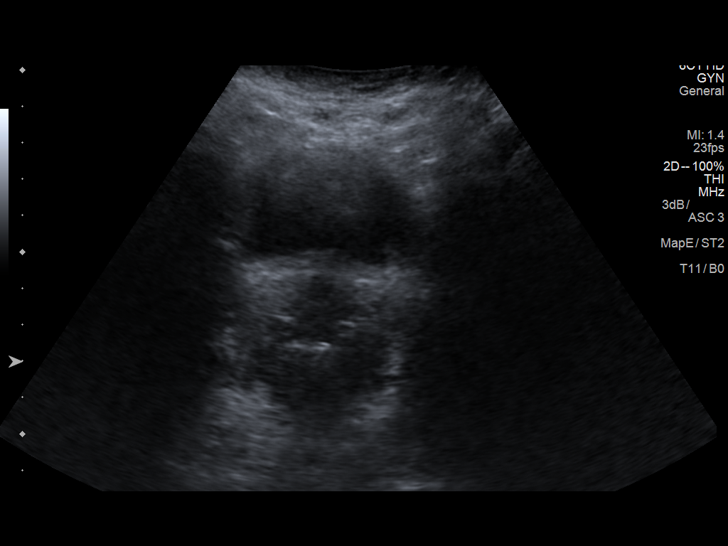
[im 17/98]
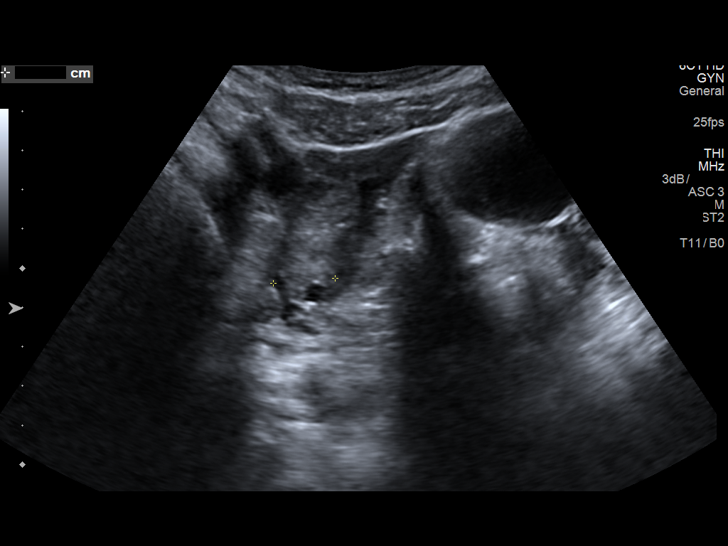
[im 25/98]
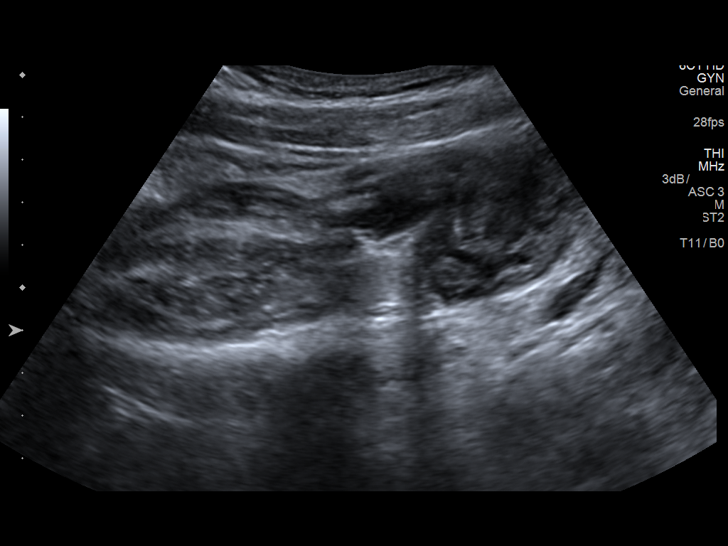
[im 33/98]
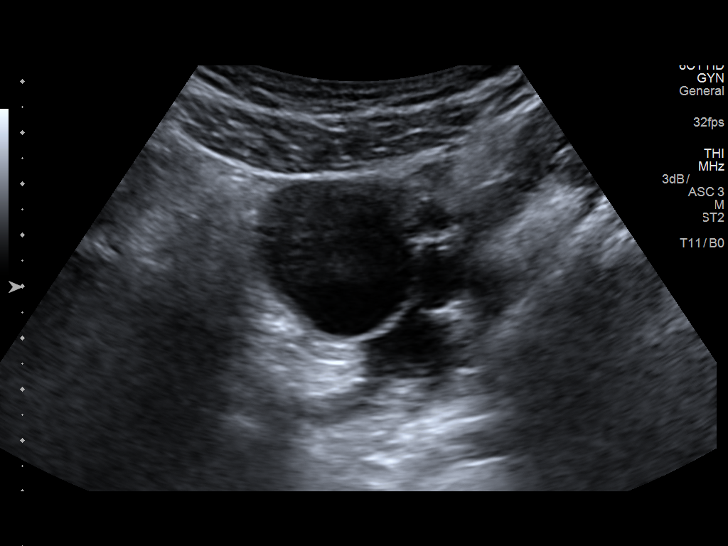
[im 41/98]
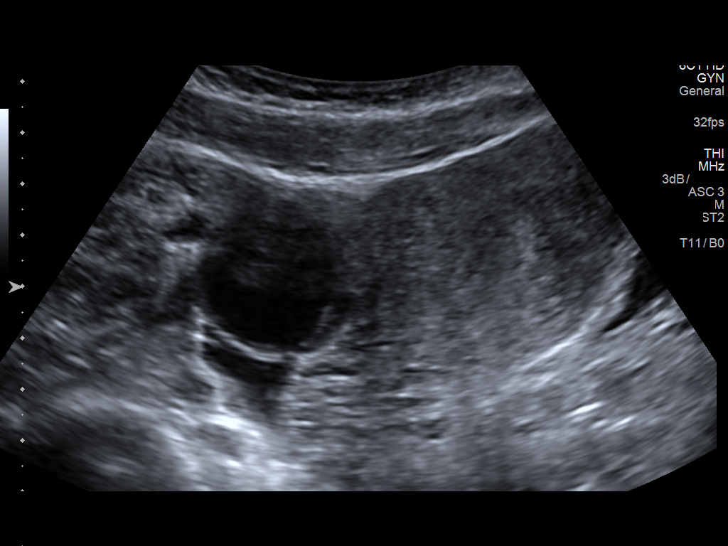
[im 49/98]
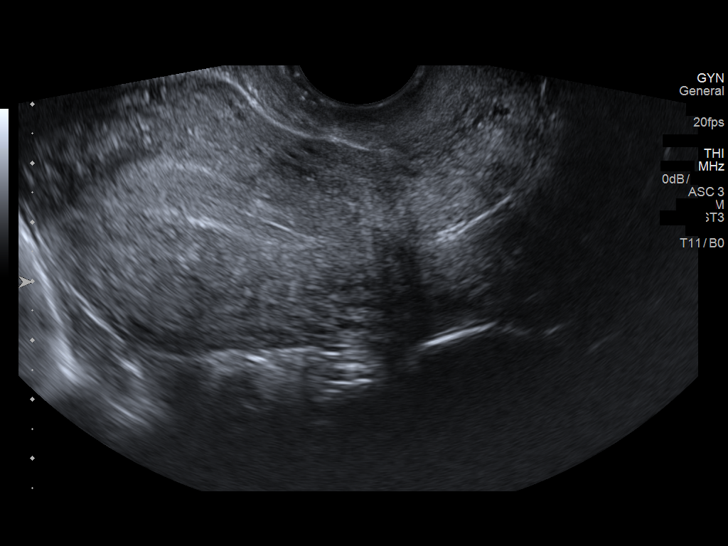
[im 57/98]
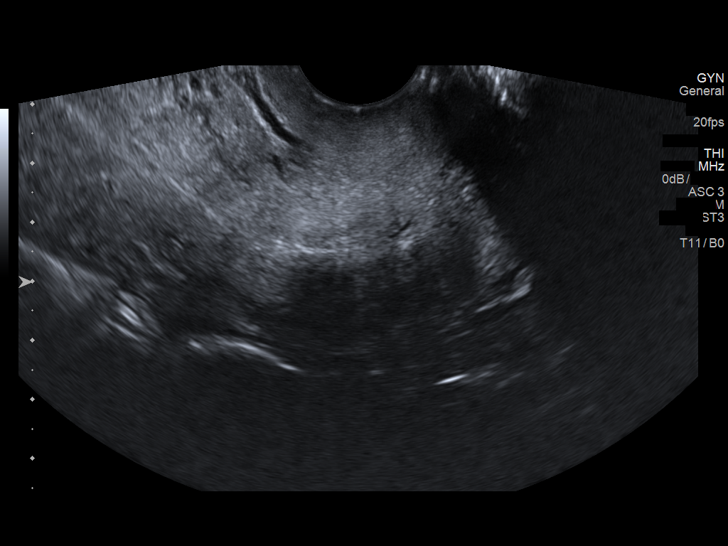
[im 65/98]
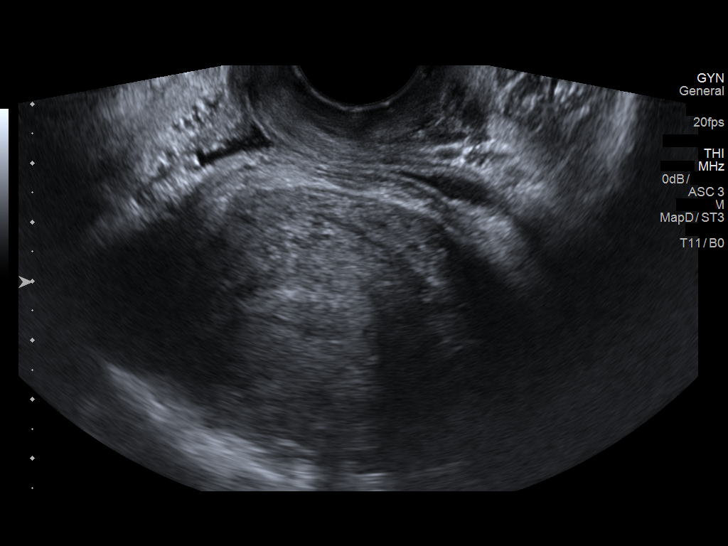
[im 73/98]
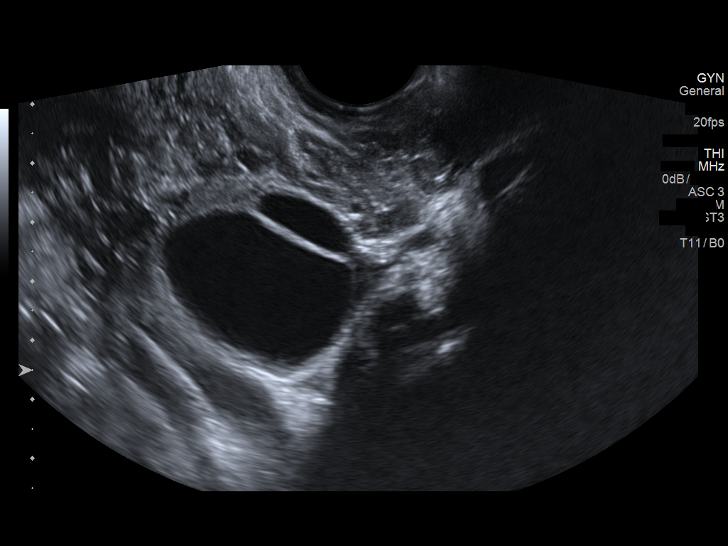
[im 81/98]
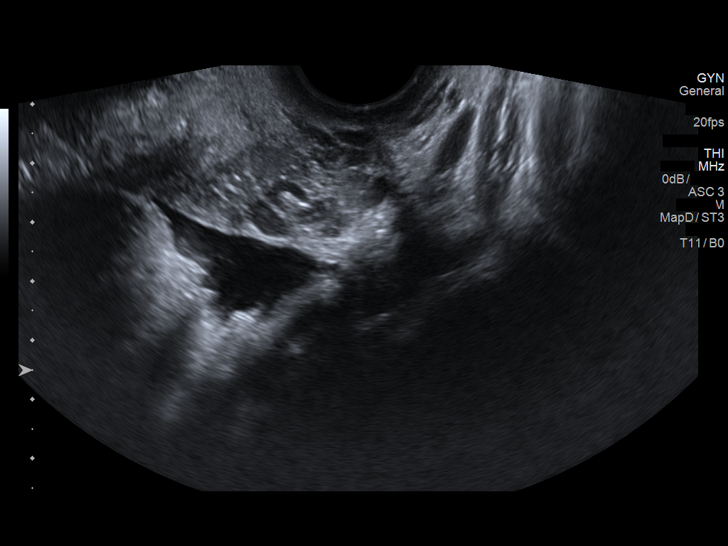
[im 89/98]
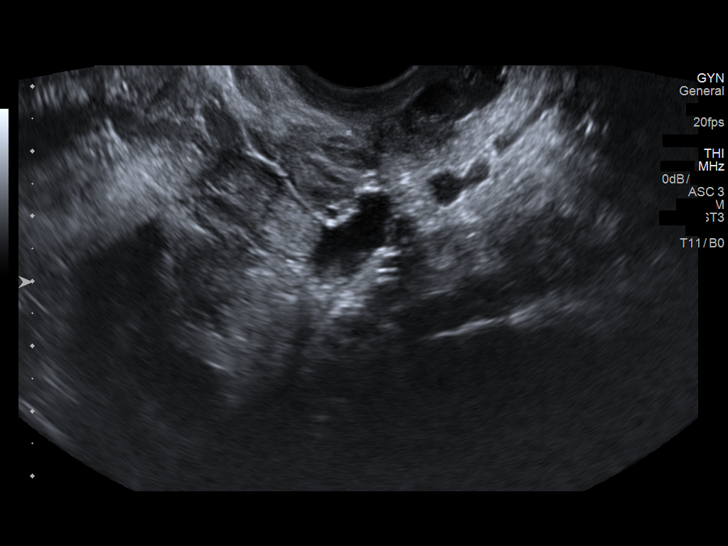
[im 98/98]
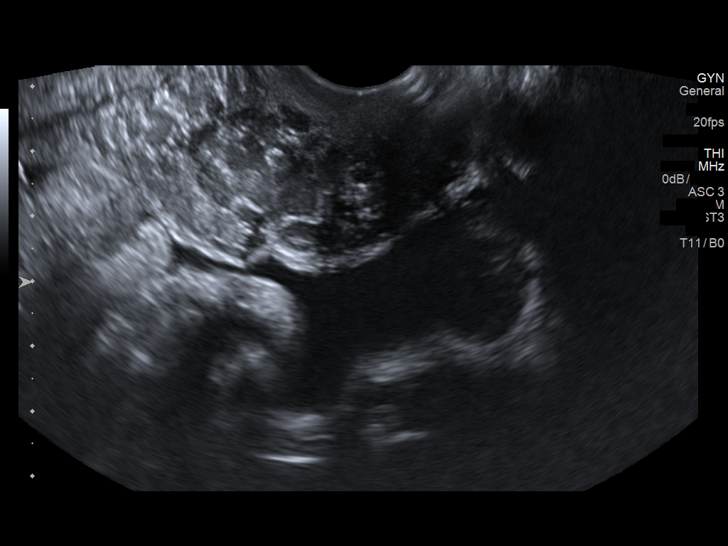

[13 of 25 positions shown; findings below may reference images not displayed]

FINDINGS: Uterus

Measurements: 10.4 x 4.7 x 6.8 cm. No fibroids or other mass
visualized.

Endometrium

Thickness: 12.3 mm.  No focal abnormality visualized.

Right ovary

Measurements: 2.1 x 1.3 x 1.6 cm. Normal appearance/no adnexal mass.

Left ovary

Measurements: 10.6 x 3.3 x 3.5 cm. 3.1 x 2.8 x 2.7 cm septated cyst
with internal echoes. Possibly hemorrhagic cyst. Pregnancy test to
exclude ectopic pregnancy suggested.

Other findings:  Small amount of free pelvic fluid.
IMPRESSION: 1. 3.1 x 2.8 x 2.7 cm complex cyst left ovary, possibly hemorrhagic
cyst. Short-interval follow up ultrasound in 6-12 weeks is
recommended, preferably during the week following the patient's
normal menses.

2. Small amount of free pelvic fluid .

## 2018-06-01 ENCOUNTER — Ambulatory Visit: Payer: Self-pay | Admitting: Obstetrics and Gynecology

## 2018-06-01 ENCOUNTER — Telehealth: Payer: Self-pay | Admitting: Obstetrics & Gynecology

## 2018-06-01 ENCOUNTER — Ambulatory Visit (INDEPENDENT_AMBULATORY_CARE_PROVIDER_SITE_OTHER): Payer: Medicaid Other | Admitting: Obstetrics and Gynecology

## 2018-06-01 ENCOUNTER — Encounter: Payer: Self-pay | Admitting: Obstetrics and Gynecology

## 2018-06-01 ENCOUNTER — Ambulatory Visit (INDEPENDENT_AMBULATORY_CARE_PROVIDER_SITE_OTHER): Payer: Medicaid Other

## 2018-06-01 VITALS — BP 98/58 | HR 76 | Resp 14 | Ht 59.0 in | Wt 131.0 lb

## 2018-06-01 DIAGNOSIS — N760 Acute vaginitis: Secondary | ICD-10-CM

## 2018-06-01 DIAGNOSIS — N644 Mastodynia: Secondary | ICD-10-CM | POA: Diagnosis not present

## 2018-06-01 DIAGNOSIS — R102 Pelvic and perineal pain: Secondary | ICD-10-CM | POA: Diagnosis not present

## 2018-06-01 DIAGNOSIS — Z113 Encounter for screening for infections with a predominantly sexual mode of transmission: Secondary | ICD-10-CM

## 2018-06-01 DIAGNOSIS — R1084 Generalized abdominal pain: Secondary | ICD-10-CM

## 2018-06-01 LAB — POCT URINALYSIS DIPSTICK
Bilirubin, UA: NEGATIVE
Blood, UA: NEGATIVE
GLUCOSE UA: NEGATIVE
Ketones, UA: NEGATIVE
LEUKOCYTES UA: NEGATIVE
Nitrite, UA: NEGATIVE
Protein, UA: POSITIVE — AB
Urobilinogen, UA: 0.2 E.U./dL
pH, UA: 5 (ref 5.0–8.0)

## 2018-06-01 NOTE — Telephone Encounter (Signed)
Patient is experiencing abdominal pain.

## 2018-06-01 NOTE — Progress Notes (Signed)
GYNECOLOGY  VISIT   HPI: 32 y.o.   Married  [place]  female   [id number] with Patient's last menstrual period was 12/13/2017 (exact date).   here for pelvic ultrasound/consult for multiple concerns.  Status post laparoscopic hysterctomy with salpingectomy, cystoscopy with Dr. Hyacinth MeekerMiller on 12/26/17 for abnormal uterine bleeding. Final pathology - benign proliferative endometrium.  Treated for vaginal cuff cellulitis post op.  Feels really good since hysterectomy, no more bloating.   Here for multiple concerns: 1.  Breast pain on the right off and on for 1 - 2 months.  No palpable lumps.  Hurts to touch.   2.  Abdominal pain of a month's duration.  Pain is a sharp cramping in the right side. Pain is constantly there, but today it was worse when she woke up.   Rated it an 8 out of 10 when she called earlier today.  Told to see GI, but she cannot get an appointment for a while.  Has appt on 07/04/18 - Dr. Rhea BeltonPyrtle.  Feels like her pain is lower and lower.   Last BM was last night and did not help her pain.   Not taking pain medication.  States she does not take pain medications.  3.  Vaginal bleeding? States she saw blood once with wiping, but not since.  This did not follow intercourse.   Last intercourse was one month ago and was somewhat uncomfortable.  No lubricant used.   4.  Some white discharge.  New partner.  Loss of sexual interest.   5.  Pain with urination but not every time.  When her bladder is full the pain is worse.  States Dr. Hyacinth MeekerMiller would be proud of her that she is taking her Cymbalta.  Son just diagnosed with DM.  Was in DKA with a glucose of 1065.  Survived against the odds according to the patient.  She states she actually had started planning his funeral.  He is with her at the office visit today.   GYNECOLOGIC HISTORY: Patient's last menstrual period was 12/13/2017 (exact date). Contraception:  Hysterectomy Menopausal hormone therapy:   none Last mammogram:  n/a Last pap smear:   04/04/17 Neg. HR HPV:neg        OB History    Gravida  6   Para  4   Term  3   Preterm  1   AB  2   Living  4     SAB      TAB  1   Ectopic  1   Multiple      Live Births  4              Patient Active Problem List   Diagnosis Date Noted  . Menorrhagia 12/26/2017  . IDA (iron deficiency anemia) 05/19/2017  . Epigastric pain 04/26/2017  . Macrocytic anemia 04/26/2017  . Herpes genitalia 04/08/2011    Past Medical History:  Diagnosis Date  . Allergy   . Anemia   . Anxiety   . Depression   . Ectopic pregnancy, tubal   . GERD (gastroesophageal reflux disease)   . H. pylori infection   . IBS (irritable bowel syndrome)   . Internal hemorrhoids   . Ovarian cyst bil  . PONV (postoperative nausea and vomiting)   . Trichomonas infection     Past Surgical History:  Procedure Laterality Date  . CYSTOSCOPY N/A 12/26/2017   Procedure: CYSTOSCOPY;  Surgeon: Jerene BearsMiller, Mary S, MD;  [facility];  Service: Gynecology;  Laterality: N/A;  . TOTAL LAPAROSCOPIC HYSTERECTOMY WITH SALPINGECTOMY Bilateral 12/26/2017   Procedure: TOTAL LAPAROSCOPIC HYSTERECTOMY WITH SALPINGECTOMY;  Surgeon: Jerene BearsMiller, Mary S, MD;  [facility];  Service: Gynecology;  Laterality: Bilateral;  . TUBAL LIGATION      Current Outpatient Medications  Medication Sig Dispense Refill  . DULoxetine (CYMBALTA) 20 MG capsule Take 1 capsule (20 mg total) by mouth daily. (Patient taking differently: Take 20 mg by mouth daily. ) 30 capsule 3   No current facility-administered medications for this visit.      ALLERGIES: Adhesive [tape]; Citrus; and Tomato  Family History  Problem Relation Age of Onset  . Endometriosis Mother   . Anemia Maternal Grandmother   . Clotting disorder Maternal Grandmother   . Thyroid disease Maternal Grandmother     Social History   Socioeconomic History  . Marital status: Married     Spouse name: Not on file  . Number of children: 4  . Years of education: Not on file  . Highest education level: Not on file  Occupational History  . Not on file  Social Needs  . Financial resource strain: Not on file  . Food insecurity:    Worry: Not on file    Inability: Not on file  . Transportation needs:    Medical: Not on file    Non-medical: Not on file  Tobacco Use  . Smoking status: Current Some Day Smoker    Packs/day: 0.10  . Smokeless tobacco: Never Used  . Tobacco comment: patient down to 2 cigarettes per day  Substance and Sexual Activity  . Alcohol use: No  . Drug use: Yes    Frequency: 28.0 times per week    Types: Marijuana    Comment: uses for pain relief, 4 per day  . Sexual activity: Not Currently    Birth control/protection: Surgical    Comment: hysterectomy  Lifestyle  . Physical activity:    Days per week: Not on file    Minutes per session: Not on file  . Stress: Not on file  Relationships  . Social connections:    Talks on phone: Not on file    Gets together: Not on file    Attends religious service: Not on file    Active member of club or organization: Not on file    Attends meetings of clubs or organizations: Not on file    Relationship status: Not on file  . Intimate partner violence:    Fear of current or ex partner: Not on file    Emotionally abused: Not on file    Physically abused: Not on file    Forced sexual activity: Not on file  Other Topics Concern  . Not on file  Social History Narrative  . Not on file    Review of Systems  Constitutional:       Breast pain  Gastrointestinal: Positive for abdominal pain.  Genitourinary:       Abnormal discharge Unscheduled bleeding or spotting loss of sexual interest Pain with urination  All other systems reviewed and are negative.   PHYSICAL EXAMINATION:    BP (!) 98/58 (BP Location: Right Arm, Patient Position: Sitting, Cuff Size: Normal)   Pulse 76   Resp 14   Ht 4\' 11"   (1.499 m)   Wt 131 lb (59.4 kg)   LMP 12/13/2017 (Exact Date)   BMI 26.46 kg/m     General appearance: alert, cooperative  and appears stated age Head: Normocephalic, without obvious abnormality, atraumatic Lungs: clear to auscultation bilaterally Breasts: normal appearance, no masses or tenderness, No nipple retraction or dimpling, No nipple discharge or bleeding, No axillary or supraclavicular adenopathy Heart: regular rate and rhythm Abdomen: incisions well healed.  Abdomen is soft, non-tender, no masses,  no organomegaly. No abnormal inguinal nodes palpated Neurologic: Grossly normal  Pelvic: External genitalia:  no lesions              Urethra:  normal appearing urethra with no masses, tenderness or lesions              Bartholins and Skenes: normal                 Vagina: normal appearing vagina with normal color and discharge, no lesions              Cervix: absent.  Cuff intact.  No bleeding.  Bimanual Exam:  Uterus:   Absent.              Adnexa: no mass, fullness, tenderness              Rectal exam: Yes.  .  Confirms.              Anus:  normal sphincter tone, no lesions  Chaperone was present for exam.  Pelvic US Absent uterus.  Normal vaginal cuff.  Normal ovaries bilaterally.   Small left CL cyst and small right ovarian follicle.  ASSESSMENT  Pelvic pain.  No acute abdomen.  Ovulation? STD screening.  Vaginitis.  Breast pain.  No masses.  No diagnostic testing needed.  PLAN  Nuswab for vaginitis testing and STD screening.  STD serum screening. Urine dip - protein only.  Reassurance regarding breast and pelvic exam.  I do recommend she still follow up with her GI.  She will go to the ER if she develops increasing or recurrent pain during the weekend.  Support given for her traumatic experience with her son's health issues.  FU with Dr. Hyacinth Meeker prn.    An After Visit Summary was printed and given to the patient.  __25____ minutes face to face time of which  over 50% was spent in counseling.

## 2018-06-01 NOTE — Telephone Encounter (Signed)
Call returned to patient. Advised per Dr. Edward JollySilva. PUS scheduled for today at 11:30am, with consult to follow with Dr. Edward JollySilva. Advised patient she is being worked into schedule, patient verbalizes understanding and is agreeable.   Order placed for PUS.   Encounter closed

## 2018-06-01 NOTE — Telephone Encounter (Signed)
I recommend a visit earlier today and a pelvic ultrasound.   Cc- Jennifer RuddySally Combs

## 2018-06-01 NOTE — Addendum Note (Signed)
Addended by: Leda MinHAMM, Kima Malenfant N on: 06/01/2018 10:06 AM   Modules accepted: Orders

## 2018-06-01 NOTE — Telephone Encounter (Signed)
Spoke with patient. She c/o of two days of vaginal and abdominal pain and dysuria. Describes cramping since last night. Hx Hysterectomy, ovaries remain. Concern about possible STD exposure last encounter last month.  Rates pain when it comes as 8/10. Has not used anything that helps pain. Reports some vaginal discharge with odor that is worse at night. No fevers, no diarrhea, no vomiting.   Has a GI appointment next month but states she feels this is not GI related and states PCP told her to call GYN.  Office visit today at 1430 with Dr. Edward JollySilva. Instructions given on when to seek emergency care if symptoms increase prior to appointment and patient verbalizes understanding.  Routing to Dr. Edward JollySilva. Encounter closed.

## 2018-06-02 LAB — HEPATITIS C ANTIBODY

## 2018-06-02 LAB — NUSWAB VAGINITIS PLUS (VG+)
Atopobium vaginae: HIGH Score — AB
BVAB 2: HIGH Score — AB
CANDIDA GLABRATA, NAA: NEGATIVE
Candida albicans, NAA: NEGATIVE
Chlamydia trachomatis, NAA: NEGATIVE
Megasphaera 1: HIGH Score — AB
NEISSERIA GONORRHOEAE, NAA: NEGATIVE
TRICH VAG BY NAA: NEGATIVE

## 2018-06-02 LAB — HEP, RPR, HIV PANEL
HIV Screen 4th Generation wRfx: NONREACTIVE
Hepatitis B Surface Ag: NEGATIVE
RPR Ser Ql: NONREACTIVE

## 2018-06-05 ENCOUNTER — Telehealth: Payer: Self-pay | Admitting: *Deleted

## 2018-06-05 MED ORDER — METRONIDAZOLE 500 MG PO TABS: 500.0000 mg | ORAL_TABLET | Freq: Two times a day (BID) | ORAL | 0 refills | Status: DC

## 2018-06-05 NOTE — Telephone Encounter (Signed)
Call to patient. All results reviewed with patient and she verbalized understanding. Patient requests oral flagyl. States she has taken medication before and aware no ETOH. Pharmacy on file confirmed. Rx for flagyl 500mg , #14, 0RF sent to pharmacy on file. Advised patient to return call to office if symptoms do not resolve after treatment. Patient agreeable.   Patient agreeable to disposition. Will close encounter.

## 2018-06-05 NOTE — Telephone Encounter (Signed)
-----   Message from Patton SallesBrook E Amundson C Silva, MD sent at 06/05/2018  9:15 AM EDT ----- Please report labs to patient.  She has bacterial vaginosis. She may treat with Flagyl 500 mg po bid for 7 days or Metrogel pv at hs for 5 nights.  Please send Rx to pharmacy of choice. ETOH precautions.   Testing is negative for HIV, syphilis, hep B and C, gonorrhea, chlamydia, and trichomonas.

## 2018-07-04 ENCOUNTER — Encounter (INDEPENDENT_AMBULATORY_CARE_PROVIDER_SITE_OTHER): Payer: Self-pay

## 2018-07-04 ENCOUNTER — Encounter: Payer: Self-pay | Admitting: Internal Medicine

## 2018-07-04 ENCOUNTER — Ambulatory Visit: Payer: Medicaid Other | Admitting: Internal Medicine

## 2018-07-04 VITALS — BP 110/74 | HR 60 | Ht 59.5 in | Wt 133.4 lb

## 2018-07-04 DIAGNOSIS — K589 Irritable bowel syndrome without diarrhea: Secondary | ICD-10-CM

## 2018-07-05 NOTE — Progress Notes (Signed)
Pt left without being seen and canceled appt

## 2018-09-18 IMAGING — US US PELVIS COMPLETE TRANSABD/TRANSVAG
1 series · 13 of 25 positions shown · non-contrast
Comparison: 04/08/2017

CLINICAL DATA: Complex left ovarian cyst

EXAM:
TRANSABDOMINAL AND TRANSVAGINAL ULTRASOUND OF PELVIS
TECHNIQUE: Both transabdominal and transvaginal ultrasound examinations of the
pelvis were performed. Transabdominal technique was performed for
global imaging of the pelvis including uterus, ovaries, adnexal
regions, and pelvic cul-de-sac. It was necessary to proceed with
endovaginal exam following the transabdominal exam to visualize the
uterus and adnexae in better detail.

[Series 1: us pelvis complete transabd/transvag · 0.22mm/px · 13 of 67 slices shown]
[im 1/67]
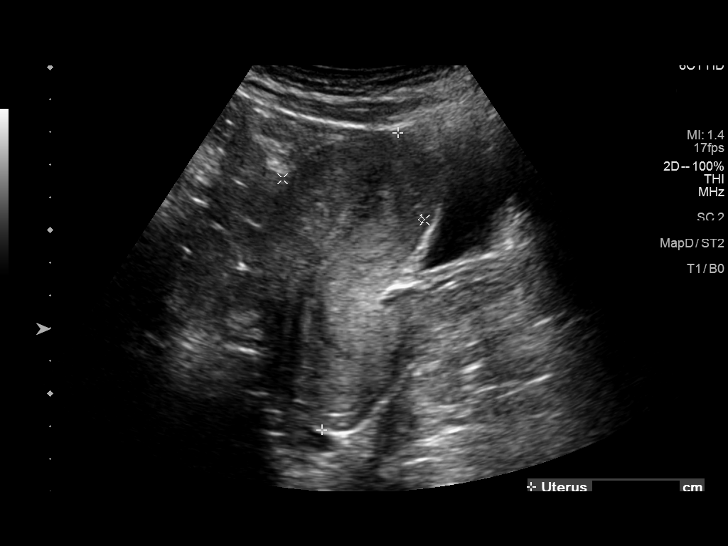
[im 6/67]
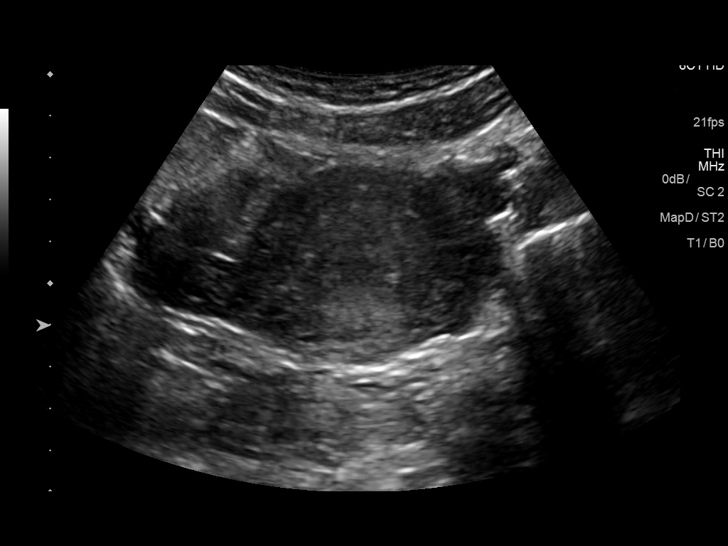
[im 12/67]
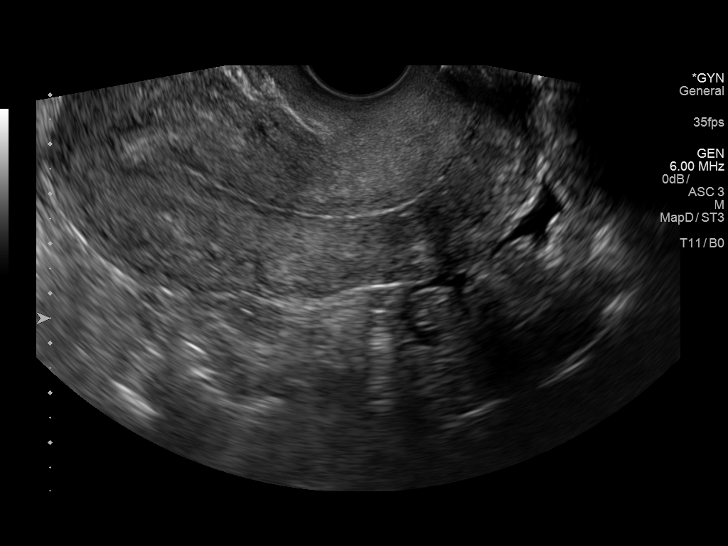
[im 17/67]
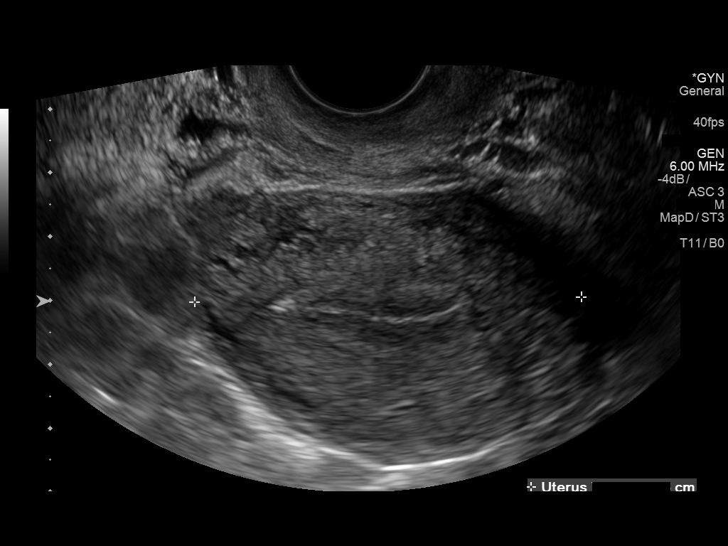
[im 23/67]
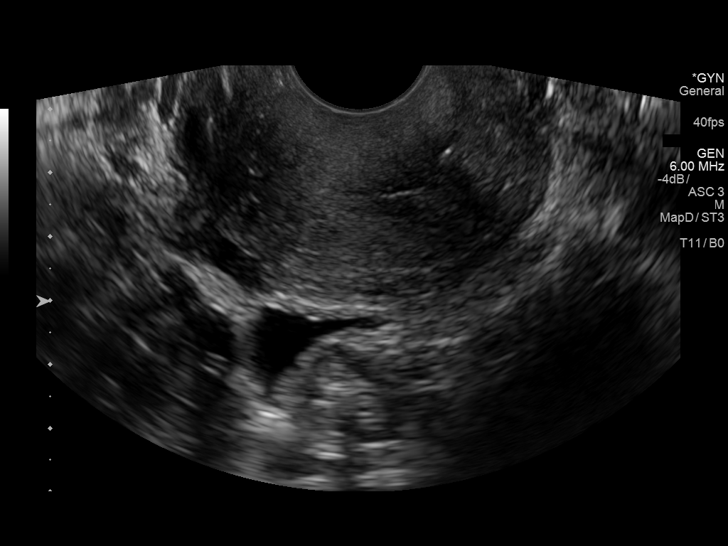
[im 28/67]
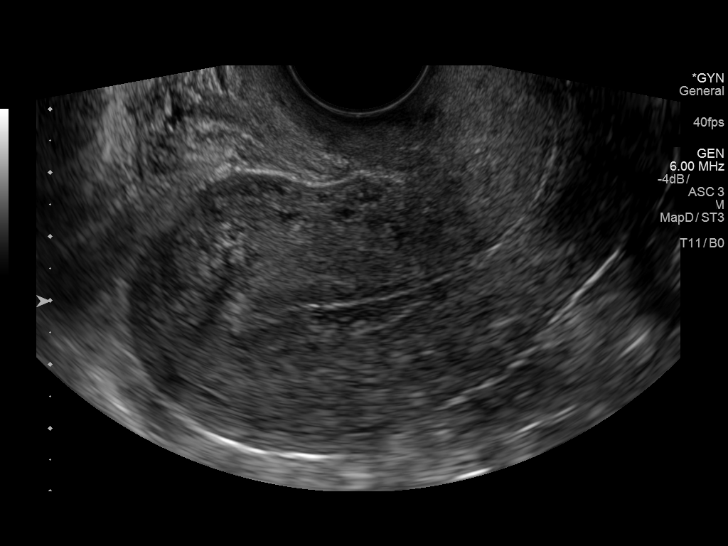
[im 34/67]
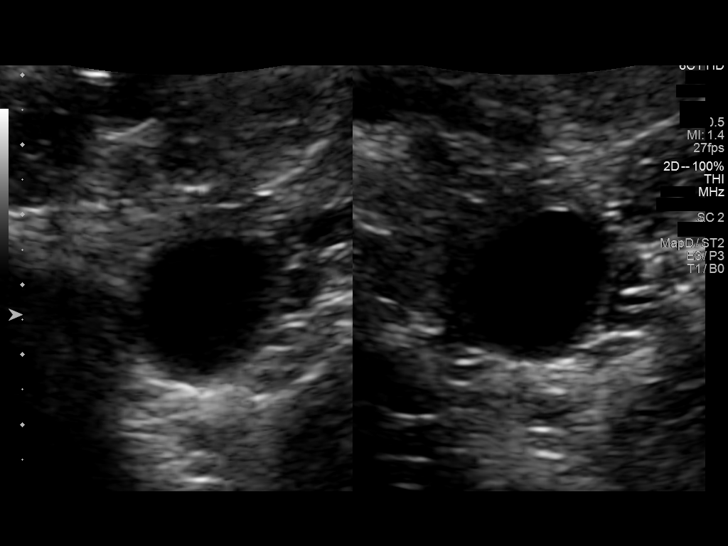
[im 39/67]
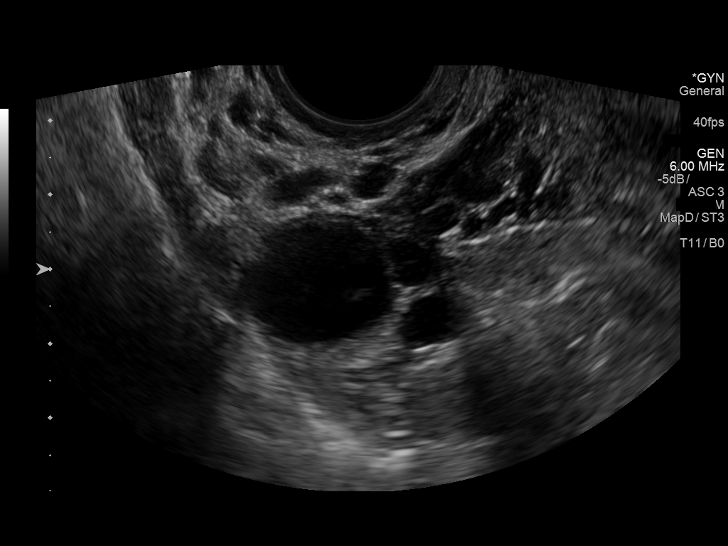
[im 45/67]
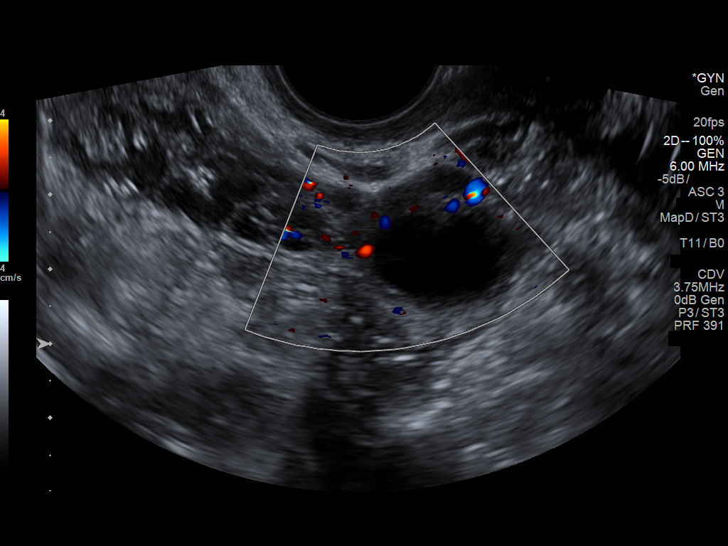
[im 50/67]
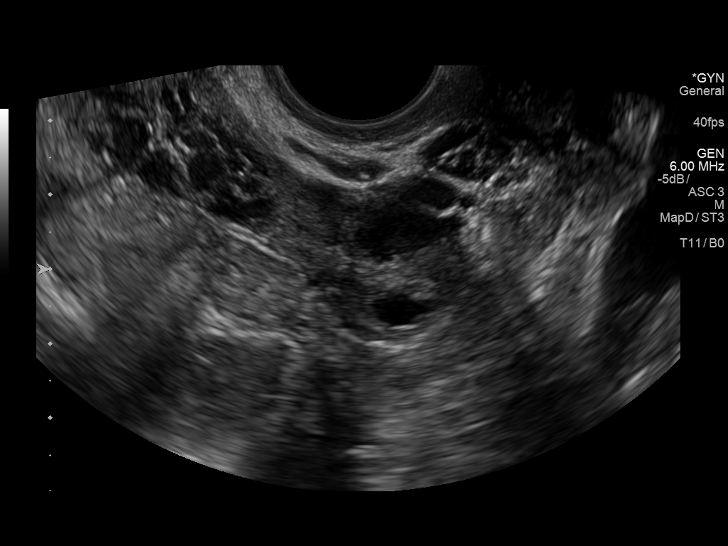
[im 56/67]
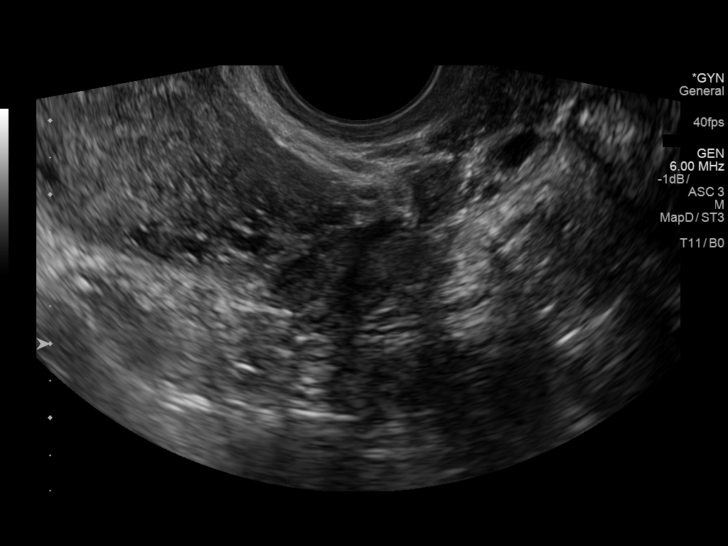
[im 61/67]
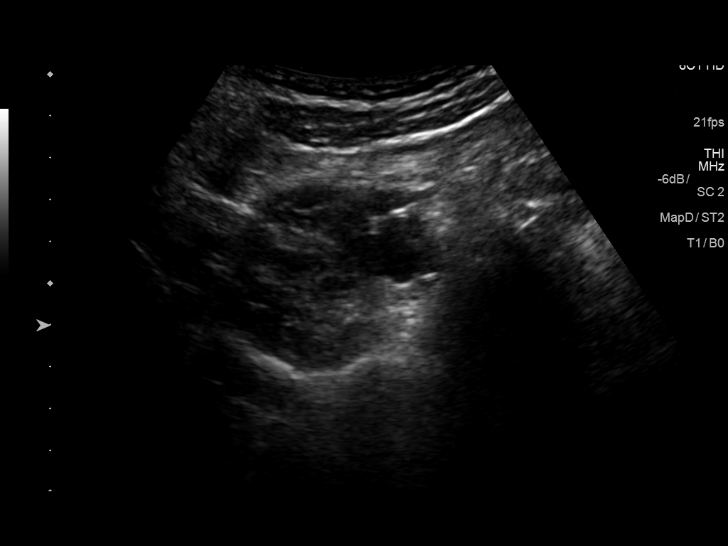
[im 67/67]
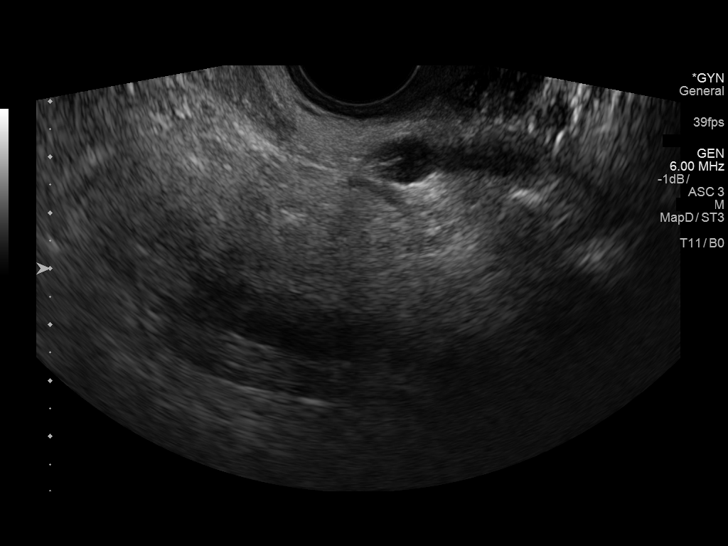

[13 of 25 positions shown; findings below may reference images not displayed]

FINDINGS: Uterus

Measurements: 9.4 x 4.5 x 6.2 cm. No fibroids or other mass
visualized.

Endometrium

Thickness: 12 mm.  No focal abnormality visualized.

Right ovary

Measurements: 3.4 x 2.2 x 2.4 cm. Several small right ovarian
follicles. Right ovary contains a 2.4 x 1.6 x 2.2 cm anechoic cyst
or dominant follicle. No associated vascularity.

Left ovary

Measurements: 2.0 x 1.6 x 1.6 cm. Small follicles demonstrated.
Previous left ovarian cyst has resolved..

Other findings

No abnormal free fluid.
IMPRESSION: Resolution of the left ovarian cyst.

2.4 cm dominant right ovarian follicle versus simple cyst.

No free fluid or acute finding by ultrasound.

## 2018-10-03 ENCOUNTER — Other Ambulatory Visit: Payer: Self-pay

## 2018-10-03 ENCOUNTER — Telehealth: Payer: Self-pay | Admitting: Internal Medicine

## 2018-10-03 DIAGNOSIS — R109 Unspecified abdominal pain: Secondary | ICD-10-CM

## 2018-10-03 MED ORDER — OMEPRAZOLE 20 MG PO CPDR: 20.0000 mg | DELAYED_RELEASE_CAPSULE | Freq: Two times a day (BID) | ORAL | 3 refills | Status: DC

## 2018-10-03 NOTE — Telephone Encounter (Signed)
Pt last seen 09/05/17, had appt scheduled but did not stay to be seen 07/04/18. Pt calling stating she feels like her intestines are "on fire." States she started having loose/soft stools yesterday at 4pm and it has continued until present, states she feels empty now and tries to go but nothing comes out. States her bottom is raw now. Asked pt if she took Imodium and she states she doesn't take anything unless Dr. Rhea BeltonPyrtle tells her to take it. Please advise.

## 2018-10-03 NOTE — Telephone Encounter (Signed)
Please schedule office follow-up appointment for continuity  We need to check H. pylori stool antigen; history of this infection with similar symptoms  Once stool studies submitted she can resume omeprazole 20 mg twice daily AC until she is

## 2018-10-03 NOTE — Telephone Encounter (Signed)
Pt aware, order in epic for labs, script sent to pharmacy. Pt scheduled to see Dr. Rhea BeltonPyrtle 11/07/18@9 :45am. Pt aware.

## 2018-10-03 NOTE — Progress Notes (Signed)
hp

## 2018-10-04 ENCOUNTER — Other Ambulatory Visit: Payer: Medicaid Other

## 2018-10-05 ENCOUNTER — Other Ambulatory Visit: Payer: Medicaid Other

## 2018-10-05 ENCOUNTER — Ambulatory Visit: Payer: Medicaid Other | Admitting: Physician Assistant

## 2018-10-05 DIAGNOSIS — R109 Unspecified abdominal pain: Secondary | ICD-10-CM | POA: Diagnosis not present

## 2018-10-08 LAB — HELICOBACTER PYLORI  SPECIAL ANTIGEN
MICRO NUMBER:: 91520854
RESULT:: DETECTED — AB
SPECIMEN QUALITY: ADEQUATE

## 2018-10-09 ENCOUNTER — Telehealth: Payer: Self-pay | Admitting: Internal Medicine

## 2018-10-09 DIAGNOSIS — A048 Other specified bacterial intestinal infections: Secondary | ICD-10-CM

## 2018-10-09 MED ORDER — BIS SUBCIT-METRONID-TETRACYC 140-125-125 MG PO CAPS: 3.0000 | ORAL_CAPSULE | Freq: Three times a day (TID) | ORAL | 0 refills | Status: DC

## 2018-10-09 NOTE — Telephone Encounter (Signed)
Spoke with pt and she is aware. Script sent to pharmacy. Reminder in epic.

## 2018-10-09 NOTE — Telephone Encounter (Signed)
Discussed with pt that once Dr. Rhea BeltonPyrtle reviews the results we will notify her. Let her know she should pick up the prilosec and start taking it as it will help her symptoms. Pt verbalized understanding.

## 2018-10-09 NOTE — Telephone Encounter (Signed)
BID PPI Pylera x 10 days  Repeat H Pylori stool ag in 8 weeks after abx off PPI to confirm eradication

## 2018-10-09 NOTE — Telephone Encounter (Signed)
Pyrtle pt that called last week with abdominal pain/discomfort. Pt was positive for H pylori in August of 2018 found on EGD. Pt gave stool specimen last week and it is positive for H pylori. Dr. Russella DarStark as DOD please advise.

## 2018-10-09 NOTE — Telephone Encounter (Signed)
Dr. Rhea BeltonPyrtle can address when he returns.

## 2018-10-09 NOTE — Telephone Encounter (Signed)
Patient states she brought in a stool test and wants to know if the results are back yet. Patient states she also has not picked up her medication discussed in phone note from 12.17.19 bc she did not know if she was suppose to take it or not.

## 2018-11-07 ENCOUNTER — Ambulatory Visit: Payer: Medicaid Other | Admitting: Internal Medicine

## 2018-11-23 ENCOUNTER — Ambulatory Visit: Payer: Medicaid Other | Admitting: Internal Medicine

## 2018-12-14 ENCOUNTER — Ambulatory Visit: Payer: Medicaid Other | Admitting: Internal Medicine

## 2018-12-21 ENCOUNTER — Ambulatory Visit: Payer: Medicaid Other | Admitting: Internal Medicine

## 2018-12-26 ENCOUNTER — Telehealth: Payer: Self-pay | Admitting: Obstetrics & Gynecology

## 2018-12-26 ENCOUNTER — Ambulatory Visit: Payer: Medicaid Other | Admitting: Obstetrics & Gynecology

## 2018-12-26 ENCOUNTER — Encounter: Payer: Self-pay | Admitting: Obstetrics & Gynecology

## 2018-12-26 ENCOUNTER — Other Ambulatory Visit: Payer: Self-pay

## 2018-12-26 VITALS — BP 124/60 | HR 76 | Temp 98.3°F | Resp 16 | Ht 59.5 in | Wt 131.8 lb

## 2018-12-26 DIAGNOSIS — F329 Major depressive disorder, single episode, unspecified: Secondary | ICD-10-CM

## 2018-12-26 DIAGNOSIS — R52 Pain, unspecified: Secondary | ICD-10-CM

## 2018-12-26 DIAGNOSIS — N9089 Other specified noninflammatory disorders of vulva and perineum: Secondary | ICD-10-CM | POA: Diagnosis not present

## 2018-12-26 DIAGNOSIS — R102 Pelvic and perineal pain: Secondary | ICD-10-CM | POA: Diagnosis not present

## 2018-12-26 DIAGNOSIS — G43001 Migraine without aura, not intractable, with status migrainosus: Secondary | ICD-10-CM

## 2018-12-26 DIAGNOSIS — R413 Other amnesia: Secondary | ICD-10-CM | POA: Diagnosis not present

## 2018-12-26 DIAGNOSIS — F32A Depression, unspecified: Secondary | ICD-10-CM

## 2018-12-26 DIAGNOSIS — N898 Other specified noninflammatory disorders of vagina: Secondary | ICD-10-CM

## 2018-12-26 DIAGNOSIS — N900 Mild vulvar dysplasia: Secondary | ICD-10-CM | POA: Diagnosis not present

## 2018-12-26 MED ORDER — DULOXETINE HCL 20 MG PO CPEP: 20.0000 mg | ORAL_CAPSULE | Freq: Every day | ORAL | 3 refills | Status: DC

## 2018-12-26 NOTE — Telephone Encounter (Signed)
Spoke with patient. Patient reports intermittent cramping "in ovaries" for the past 2 wks, more on the right side, but varies from right to left. 6/10. Denies vaginal odor, d/c, bleeding, N/V, fever/chills or urinary symptoms. S/p TLH with BS. Reports normal bowel movements, denies diarrhea, abdomen soft. States she is being followed by GI for H Pylori.   Last PUS 06/01/18: Normal ovaries bilaterally. Small left CL cyst and small right ovarian follicle.  OV scheduled for today at 1:45pm with Dr. Hyacinth Meeker.   Routing to provider for final review. Patient is agreeable to disposition. Will close encounter.

## 2018-12-26 NOTE — Telephone Encounter (Signed)
Patient says she is having some discomfort. She also thought she should be coming in sooner than May for her yearly after surgery.

## 2018-12-26 NOTE — Progress Notes (Signed)
GYNECOLOGY  VISIT  CC:   Abdominal pain  HPI: 33 y.o. [id number] Married Black or [place] American female here for breast soreness, LLQ and sometimes right sided pain.    Having some breast soreness over the past few weeks.  Has not had any significant weight gain loss.  Reports she's been having LLQ pain for about 10 days.  Denies nausea, diarrhea, or constipation.    Overall, feels her GI function is improved.  She was diagnosed with H. Pylori again.  She is now on omeprazole.  She is going to be retested again over the next week to make sure the H. Pylori is resolved.   Having some increased issues with decreased lubrication.  Denies vaginal discharge.  Denies vaginal bleeding.  Does have a new partner.  She had STD testing at [facility] two weeks ago.  She can call for results.  She has not had any phone calls so she thinks all of her testing is negative.    Lastly, had very scary event with oldest son last summer when he was diagnosed with type 1 DM.  Blood sugar was >1000.  He was in a coma for several days.  Had to learn to walk again.  He is now back in school and does not have to make up work missed so should hopefully stay on grade level.  She is so proud of him.  Stressors have been more due to him.  Out of cymbalta.  Has appt with PCP who will not RF medication until appt.  Feel this is not in her best interest.  Lastly, having much worse headaches since that time.  Right frontal region is location of most pain.  Concerned with some memory change that seems to have accompanied headaches.  Would like evaluation.  GYNECOLOGIC HISTORY: Patient's last menstrual period was 12/13/2017 (exact date). Contraception: hysterectomy  Menopausal hormone therapy: none  Patient Active Problem List   Diagnosis Date Noted  . Menorrhagia 12/26/2017  . IDA (iron deficiency anemia) 05/19/2017  . Epigastric pain 04/26/2017  . Macrocytic anemia 04/26/2017  . Herpes genitalia 04/08/2011     Past Medical History:  Diagnosis Date  . Allergy   . Anemia   . Anxiety   . Depression   . Ectopic pregnancy, tubal   . GERD (gastroesophageal reflux disease)   . H. pylori infection   . IBS (irritable bowel syndrome)   . Internal hemorrhoids   . Ovarian cyst bil  . PONV (postoperative nausea and vomiting)   . Trichomonas infection     Past Surgical History:  Procedure Laterality Date  . CYSTOSCOPY N/A 12/26/2017   Procedure: CYSTOSCOPY;  Surgeon: Jerene Bears, MD;  Location: [facility];  Service: Gynecology;  Laterality: N/A;  . TOTAL LAPAROSCOPIC HYSTERECTOMY WITH SALPINGECTOMY Bilateral 12/26/2017   Procedure: TOTAL LAPAROSCOPIC HYSTERECTOMY WITH SALPINGECTOMY;  Surgeon: Jerene Bears, MD;  Location: [facility];  Service: Gynecology;  Laterality: Bilateral;  . TUBAL LIGATION      MEDS:   Current Outpatient Medications on File Prior to Visit  Medication Sig Dispense Refill  . DULoxetine (CYMBALTA) 20 MG capsule Take 1 capsule (20 mg total) by mouth daily. (Patient taking differently: Take 20 mg by mouth daily. ) 30 capsule 3  . omeprazole (PRILOSEC) 20 MG capsule Take 1 capsule (20 mg total) by mouth 2 (two) times daily before a meal. 60 capsule 3   No current facility-administered medications on file prior to visit.  ALLERGIES: Adhesive [tape]; Citrus; and Tomato  Family History  Problem Relation Age of Onset  . Endometriosis Mother   . Anemia Maternal Grandmother   . Clotting disorder Maternal Grandmother   . Thyroid disease Maternal Grandmother     SH:  Single, occasional smoking  Review of Systems  All other systems reviewed and are negative.   PHYSICAL EXAMINATION:    BP 124/60 (BP Location: Right Arm, Patient Position: Sitting, Cuff Size: Normal)   Pulse 76   Temp 98.3 F (36.8 C) (Oral)   Resp 16   Ht 4' 11.5" (1.511 m)   Wt 131 lb 12.8 oz (59.8 kg)   LMP 12/13/2017 (Exact Date)   BMI 26.17 kg/m      General appearance: alert, cooperative and appears stated age CV:  Regular rate and rhythm Lungs:  clear to auscultation, no wheezes, rales or rhonchi, symmetric air entry Abdomen: soft, non-tender; bowel sounds normal; no masses,  no organomegaly Lymph:  no inguinal LAD noted  Pelvic: External genitalia:  Small wart like lesion on external labia majora              Urethra:  normal appearing urethra with no masses, tenderness or lesions              Bartholins and Skenes: normal                 Vagina: normal appearing vagina with yellowish discharge, no lesions              Cervix: absent              Bimanual Exam:  Uterus:  uterus absent              Adnexa: mild RLQ tenderness  Procedure:  Area cleansed with Betadine.  Sterile technique used throughout procedure.  Skin anesthestized with Lidocaine 1% plain; 28mL.  Lesion excised with sterile technique and silver nitrate applied for excellent hemostasis.  Pt tolerated procedure well no dressing applied.   Chaperone was present for exam.  Assessment: Breast tenderness Pelvic pain Vaginal discharge Vulvar lesion Stressors with son's type 1 diabetes  Plan: Pathology for vulvar biopsy pending Affirm obtained.  She will be called with results Referral to neurology Refill for Cymbalta 20mg  daily Pt will call for STD results from clinic where done about two weeks ago just to make sure this was negative If pain continues, she knows to call to schedule PUS  Lengthy visit with pt discussing multiple issues, concerns.  In additional to exam and vulvar biopsy, about 30 minutes spent with pt with >50% of this in face to face time.

## 2018-12-27 LAB — VAGINITIS/VAGINOSIS, DNA PROBE
CANDIDA SPECIES: NEGATIVE
GARDNERELLA VAGINALIS: POSITIVE — AB
Trichomonas vaginosis: NEGATIVE

## 2018-12-29 ENCOUNTER — Encounter: Payer: Self-pay | Admitting: Obstetrics & Gynecology

## 2018-12-29 ENCOUNTER — Other Ambulatory Visit: Payer: Self-pay | Admitting: *Deleted

## 2018-12-29 ENCOUNTER — Telehealth: Payer: Self-pay | Admitting: Obstetrics & Gynecology

## 2018-12-29 MED ORDER — METRONIDAZOLE 0.75 % VA GEL: 1.0000 | Freq: Every day | VAGINAL | 0 refills | Status: AC

## 2018-12-29 NOTE — Telephone Encounter (Signed)
Patient's mother, [place] Sink, calling regarding Jennifer Combs. States she has some concerns with her health and would like to speak with someone. OK per DPR.

## 2018-12-29 NOTE — Telephone Encounter (Signed)
10 minute call with patient's Mother, [place] Sink.  She requested to speak to Dr. Hyacinth Meeker directly.  I advised I would send a message about her concerns.   [place] Sink feels that [place] came for a visit with Dr. Hyacinth Meeker but was not "100% honest with how she is feeling." She states that Siann has been having mood swings for about a year and "falling apart" and will go from nice to angry to crying and depressed very quickly. She states that Eliya cannot get out of bed sometimes and her 31 year old daughter will bring her food.  Reports family history of Depress, Bipolar and Schizophrenia.  She states she has never told her that she has thoughts of harm or death. She feels [place] is safe but [place] Sink lives in [place] and not able to come and see Lilybelle sometimes. She has encouraged Yanni to seek inpatient treatment and offered to take care of Trinitee's children during that time.   She also reports that Jisel takes her medications and then stops when she feels well.   Offered emotional support to [place]. Advised difficult to make requests for inpatient treatment if patient is not willing to go and not an immediate threat to themselves or anyone else.   Gave advise regarding psychiatry crises and if calls 911 request police officers trained in crises response to respond, gave number of [facility] Crisis Line: [phone], and phone number to [facility] response  [phone].   She wrote down these numbers.   Advised will send her concerns to Dr. Hyacinth Meeker.

## 2019-01-16 ENCOUNTER — Ambulatory Visit: Payer: Medicaid Other | Admitting: Family Medicine

## 2019-01-25 ENCOUNTER — Telehealth: Payer: Self-pay | Admitting: Internal Medicine

## 2019-01-25 ENCOUNTER — Other Ambulatory Visit: Payer: Self-pay

## 2019-01-25 DIAGNOSIS — A048 Other specified bacterial intestinal infections: Secondary | ICD-10-CM

## 2019-01-25 DIAGNOSIS — R109 Unspecified abdominal pain: Secondary | ICD-10-CM

## 2019-01-25 MED ORDER — DICYCLOMINE HCL 10 MG PO CAPS: ORAL_CAPSULE | ORAL | 0 refills | Status: DC

## 2019-01-25 NOTE — Telephone Encounter (Signed)
Pt stated that she has IBS and hemorrhoids and requested a call back.  I offered her a Zoom visit on the 21st, but pt was very rude.  Please call back to advise.

## 2019-01-25 NOTE — Telephone Encounter (Signed)
Have reviewed previous notes/GI work-up. -Patient with IBS with predominant diarrhea.  Has intermittent rectal discomfort.  Colon 06/06/2017 was neg with neg TI and colonic Bx.  Did have hemorrhoids. I have reviewed endoscopic pictures. -12/26/2018-GYN exam due to chronic pelvic pain.  Neg bimanual exam.  A small vaginal lesion was removed. 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  Continued on Cymbalta. 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  Plan: -Lets try Bentyl 10 mg p.o. 3 times daily (half an hour before 2 meals and bedtime).  She should get better on that. -She is due for stool H. pylori antigen check.  Make sure she is not taking PPIs. -Due to H/O anemia, lets check CBC, CMP, CRP. (didn't see any recent labs) -If still with rectal pain, may have to make appointment with Dr Rhea Belton for possible anoscopy (to r/o AIN -given recent VIN-1).  Thx  Elby Showers

## 2019-01-25 NOTE — Telephone Encounter (Signed)
Doc of the Day  Spoke with the patient who reports rectal discomfort that is worse when she tries to lay down. She feels a "lot of pressure on them." General rectal discomfort. She has had her issue for 3 or 4 days now. She had a couple of days with loose stools which has since resolved. Denies constipation. Denies blood from her rectal area or with the bowel movement.

## 2019-01-25 NOTE — Telephone Encounter (Signed)
Called back to the patient and discussed this plan with her. She agrees to this. She will come next week for her lab work. Pharmacy is [facility].

## 2019-02-10 ENCOUNTER — Emergency Department (HOSPITAL_COMMUNITY)
Admission: EM | Admit: 2019-02-10 | Discharge: 2019-02-10 | Disposition: A | Discharge status: Home / Self Care | Payer: Medicaid Other | Attending: Emergency Medicine | Admitting: Emergency Medicine

## 2019-02-10 ENCOUNTER — Encounter (HOSPITAL_COMMUNITY): Payer: Self-pay | Admitting: Emergency Medicine

## 2019-02-10 ENCOUNTER — Other Ambulatory Visit: Payer: Self-pay

## 2019-02-10 ENCOUNTER — Emergency Department (HOSPITAL_COMMUNITY): Payer: Medicaid Other

## 2019-02-10 DIAGNOSIS — S80212A Abrasion, left knee, initial encounter: Secondary | ICD-10-CM | POA: Diagnosis not present

## 2019-02-10 DIAGNOSIS — Z23 Encounter for immunization: Secondary | ICD-10-CM | POA: Diagnosis not present

## 2019-02-10 DIAGNOSIS — S8992XA Unspecified injury of left lower leg, initial encounter: Secondary | ICD-10-CM

## 2019-02-10 DIAGNOSIS — F17210 Nicotine dependence, cigarettes, uncomplicated: Secondary | ICD-10-CM | POA: Diagnosis not present

## 2019-02-10 DIAGNOSIS — W1830XA Fall on same level, unspecified, initial encounter: Secondary | ICD-10-CM | POA: Diagnosis not present

## 2019-02-10 DIAGNOSIS — Y9302 Activity, running: Secondary | ICD-10-CM | POA: Insufficient documentation

## 2019-02-10 DIAGNOSIS — S50312A Abrasion of left elbow, initial encounter: Secondary | ICD-10-CM | POA: Diagnosis not present

## 2019-02-10 DIAGNOSIS — Y92410 Unspecified street and highway as the place of occurrence of the external cause: Secondary | ICD-10-CM | POA: Diagnosis not present

## 2019-02-10 DIAGNOSIS — S80912A Unspecified superficial injury of left knee, initial encounter: Secondary | ICD-10-CM | POA: Diagnosis not present

## 2019-02-10 DIAGNOSIS — Y999 Unspecified external cause status: Secondary | ICD-10-CM | POA: Diagnosis not present

## 2019-02-10 DIAGNOSIS — F1290 Cannabis use, unspecified, uncomplicated: Secondary | ICD-10-CM | POA: Diagnosis not present

## 2019-02-10 DIAGNOSIS — S50311A Abrasion of right elbow, initial encounter: Secondary | ICD-10-CM | POA: Insufficient documentation

## 2019-02-10 DIAGNOSIS — Z79899 Other long term (current) drug therapy: Secondary | ICD-10-CM | POA: Diagnosis not present

## 2019-02-10 MED ORDER — TETANUS-DIPHTH-ACELL PERTUSSIS 5-2.5-18.5 LF-MCG/0.5 IM SUSP
0.5000 mL | Freq: Once | INTRAMUSCULAR | Status: AC
  Administered 2019-02-10: 0.5 mL via INTRAMUSCULAR
  Filled 2019-02-10: qty 0.5

## 2019-02-10 MED ORDER — ACETAMINOPHEN 325 MG PO TABS
650.0000 mg | ORAL_TABLET | Freq: Once | ORAL | Status: AC
  Administered 2019-02-10: 650 mg via ORAL
  Filled 2019-02-10: qty 2

## 2019-02-10 NOTE — Discharge Instructions (Addendum)
You have been diagnosed today with left knee pain and abrasion of the left knee right elbow and left elbow.  At this time there does not appear to be the presence of an emergent medical condition, however there is always the potential for conditions to change. Please read and follow the below instructions.  Please return to the Emergency Department immediately for any new or worsening symptoms. Please be sure to follow up with your Primary Care Provider within one week regarding your visit today; please call their office to schedule an appointment even if you are feeling better for a follow-up visit. Please follow-up with orthopedic specialist Dr. Aundria Rud on your discharge paperwork for further evaluation of your left knee pain.  As we discussed there were no fractures or dislocations seen on your x-ray today however ligamentous and tendon injury may still be present additionally small unseen fractures may be present.  Follow-up with orthopedic specialist as advised, call their number on Monday to schedule an appointment.  Continue to use rest ice and elevation to help with your pain.  You may use the crutches provided to help keep weight off of your leg. Continue to clean your wounds daily with clean soap and water.  Change your bandage daily as well and monitor for signs of infection including redness, drainage, heat, streaking, fever swelling and pain.  Seek medical attention if signs of infection occur.  Get help right away if: Your knee swells, and the swelling gets worse. You cannot move your knee. You have very bad knee pain. Get help right away if: You have a red streak going away from your wound. The edges of the wound open up and separate. Your wound is bleeding, and the bleeding does not stop with gentle pressure. You have a rash. You faint. You have trouble breathing.  Please read the additional information packets attached to your discharge summary.

## 2019-02-10 NOTE — ED Triage Notes (Signed)
Pt. Stated, I was chasing my children on the street and fell hurt my left knee.

## 2019-02-10 NOTE — ED Notes (Signed)
Clean patient abrasions and put some bacatrion and wrapped patient abrasions

## 2019-02-10 NOTE — ED Provider Notes (Signed)
[facility] EMERGENCY DEPARTMENT Provider Note   CSN: [id number] Arrival date & time: 02/10/19  1112    History   Chief Complaint Chief Complaint  Patient presents with  . Knee Injury    HPI Jennifer Combs is a 33 y.o. female presented today for left knee pain after fall that occurred yesterday afternoon.  Patient reports that she was running in the street while playing with her children when she tripped landing on her left knee and her right elbow.  Patient reports that she had immediate pain to her left knee throbbing sensation moderate intensity to the anterior portion constant and worsened with movement.  Patient states that she was able to stand up without assistance and continue playing however after seeing the abrasion on her knee she returned home to clean the wound.  Patient reports that she has had continued pain since that time and is concerned for fracture.  Patient denies any head injury or loss of consciousness during her fall she denies any pain to her neck back chest or abdomen.  Patient does have abrasion to bilateral elbows however denies pain to these areas.  She has been bandaging her wounds and cleaning with soap and water.     HPI  Past Medical History:  Diagnosis Date  . Allergy   . Anemia   . Anxiety   . Depression   . Ectopic pregnancy, tubal   . GERD (gastroesophageal reflux disease)   . H. pylori infection   . IBS (irritable bowel syndrome)   . Internal hemorrhoids   . Ovarian cyst bil  . PONV (postoperative nausea and vomiting)   . Trichomonas infection     Patient Active Problem List   Diagnosis Date Noted  . Menorrhagia 12/26/2017  . IDA (iron deficiency anemia) 05/19/2017  . Epigastric pain 04/26/2017  . Macrocytic anemia 04/26/2017  . Herpes genitalia 04/08/2011    Past Surgical History:  Procedure Laterality Date  . CYSTOSCOPY N/A 12/26/2017   Procedure: CYSTOSCOPY;  Surgeon: Jerene Bears, MD;  Location: [facility];  Service: Gynecology;  Laterality: N/A;  . TOTAL LAPAROSCOPIC HYSTERECTOMY WITH SALPINGECTOMY Bilateral 12/26/2017   Procedure: TOTAL LAPAROSCOPIC HYSTERECTOMY WITH SALPINGECTOMY;  Surgeon: Jerene Bears, MD;  Location: [facility];  Service: Gynecology;  Laterality: Bilateral;  . TUBAL LIGATION       OB History    Gravida  6   Para  4   Term  3   Preterm  1   AB  2   Living  4     SAB      TAB  1   Ectopic  1   Multiple      Live Births  4            Home Medications    Prior to Admission medications   Medication Sig Start Date End Date Taking? Authorizing Provider  acetaminophen (TYLENOL) 500 MG tablet Take 500 mg by mouth every 6 (six) hours as needed (knee pain).   Yes [provider]  DULoxetine (CYMBALTA) 20 MG capsule Take 1 capsule (20 mg total) by mouth daily. 12/26/18  Yes Jerene Bears, MD  omeprazole (PRILOSEC) 20 MG capsule Take 1 capsule (20 mg total) by mouth 2 (two) times daily before a meal. 10/03/18  Yes Pyrtle, Carie Caddy, MD  dicyclomine (BENTYL) 10 MG capsule Take 1 capsule 30 minutes ac 2 meals of the day and at bedtime Patient not  taking: Reported on 02/10/2019 01/25/19   Lynann Bologna, MD    Family History Family History  Problem Relation Age of Onset  . Endometriosis Mother   . Anemia Maternal Grandmother   . Clotting disorder Maternal Grandmother   . Thyroid disease Maternal Grandmother     Social History Social History   Tobacco Use  . Smoking status: Current Some Day Smoker    Packs/day: 0.10  . Smokeless tobacco: Never Used  . Tobacco comment: patient down to 2 cigarettes per day  Substance Use Topics  . Alcohol use: No  . Drug use: Yes    Frequency: 28.0 times per week    Types: Marijuana    Comment: uses for pain relief, 4 per day     Allergies   Adhesive [tape]; Citrus; and Tomato   Review of Systems Review of Systems  Constitutional: Negative.  Negative for chills and  fever.  Eyes: Negative.  Negative for visual disturbance.  Respiratory: Negative.  Negative for cough and shortness of breath.   Cardiovascular: Negative.  Negative for chest pain.  Gastrointestinal: Negative.  Negative for abdominal pain, nausea and vomiting.  Musculoskeletal: Positive for arthralgias (Left knee). Negative for back pain and neck pain.  Skin: Positive for wound (Bilateral elbows left knee).  Neurological: Negative.  Negative for syncope, weakness, numbness and headaches.     Physical Exam Updated Vital Signs BP 137/82 (BP Location: Right Arm)   Pulse (!) 115   Temp 98.2 F (36.8 C) (Oral)   Resp 18   Ht  (1.499 m)   Wt 60.8 kg   LMP 12/13/2017 (Exact Date)   SpO2 100%   BMI 27.06 kg/m   Physical Exam Constitutional:      General: She is not in acute distress.    Appearance: Normal appearance. She is well-developed. She is not ill-appearing or diaphoretic.  HENT:     Head: Normocephalic and atraumatic. No raccoon eyes or Battle's sign.     Jaw: There is normal jaw occlusion. No trismus.     Right Ear: External ear normal.     Left Ear: External ear normal.     Nose: Nose normal. No rhinorrhea.     Right Nostril: No epistaxis.     Left Nostril: No epistaxis.     Mouth/Throat:     Lips: Pink.     Mouth: Mucous membranes are moist.     Pharynx: Oropharynx is clear.  Eyes:     General: Vision grossly intact. Gaze aligned appropriately.     Extraocular Movements: Extraocular movements intact.     Conjunctiva/sclera: Conjunctivae normal.     Pupils: Pupils are equal, round, and reactive to light.  Neck:     Musculoskeletal: Full passive range of motion without pain, normal range of motion and neck supple.     Trachea: Trachea and phonation normal. No tracheal tenderness or tracheal deviation.  Cardiovascular:     Rate and Rhythm: Normal rate and regular rhythm.     Pulses:          Radial pulses are 2+ on the right side and 2+ on the left side.        Dorsalis pedis pulses are 2+ on the right side and 2+ on the left side.       Posterior tibial pulses are 2+ on the right side and 2+ on the left side.  Pulmonary:     Effort: Pulmonary effort is normal. No accessory muscle usage or respiratory  distress.     Breath sounds: Normal breath sounds and air entry.  Abdominal:     General: There is no distension.     Palpations: Abdomen is soft.     Tenderness: There is no abdominal tenderness. There is no guarding or rebound.  Musculoskeletal: Normal range of motion.     Comments: Left Knee:   2 abrasions present to anterior left knee.  Please see picture below. Tenderness to palpation over patella.  Active and passive flexion and extension intact with pain, without crepitus.Negative anterior/poster drawer bilaterally.Negative ballottement test. No varus or valgus laxity or locking. No tenderness to palpation of hips or ankles.Compartments soft. Neurovascularly intact distally to site of injury. - With abrasion to medial forearm as seen in picture below.  Full range of motion and 5/5 strength to all movements of bilateral upper extremities.  Radial pulses intact and equal bilaterally.  Capillary refill and sensation intact to all fingers.  No induration, fluctuance, increased warmth or swelling suggestive of infection.  Feet:     Right foot:     Protective Sensation: 5 sites tested. 5 sites sensed.     Left foot:     Protective Sensation: 5 sites tested. 5 sites sensed.  Skin:    General: Skin is warm and dry.  Neurological:     Mental Status: She is alert.     GCS: GCS eye subscore is 4. GCS verbal subscore is 5. GCS motor subscore is 6.     Comments: Speech is clear and goal oriented, follows commands Major Cranial nerves without deficit, no facial droop Moves extremities without ataxia, coordination intact  Psychiatric:        Behavior: Behavior normal.          ED Treatments / Results  Labs (all labs ordered are listed, but  only abnormal results are displayed) Labs Reviewed - No data to display  EKG None  Radiology Dg Knee Complete 4 Views Left  Result Date: 02/10/2019 CLINICAL DATA:  Fall EXAM: LEFT KNEE - COMPLETE 4+ VIEW COMPARISON:  None. FINDINGS: No acute fracture. No dislocation.  Unremarkable soft tissues. IMPRESSION: No acute bony pathology. Electronically Signed   By: Jolaine ClickArthur  Hoss M.D.   On: 02/10/2019 12:53    Procedures Procedures (including critical care time)  Medications Ordered in ED Medications  acetaminophen (TYLENOL) tablet 650 mg (650 mg Oral Given 02/10/19 1147)  Tdap (BOOSTRIX) injection 0.5 mL (0.5 mLs Intramuscular Given 02/10/19 1218)     Initial Impression / Assessment and Plan / ED Course  I have reviewed the triage vital signs and the nursing notes.  Pertinent labs & imaging results that were available during my care of the patient were reviewed by me and considered in my medical decision making (see chart for details).    33 year old female presenting for left knee pain and abrasions left knee and right forearm after mechanical fall that occurred yesterday while playing with her children.  No head injury or loss of consciousness patient is ambulatory since time of injury with minimal difficulty.  She has been providing wound care at home and there are no signs of infection on arrival.  Patient is neurovascular intact to all 4 extremities.  No sign of cellulitis, septic arthritis, DVT, compartment syndrome or significant bony injury.  DG left knee:  IMPRESSION:  No acute bony pathology.  - Patient's tetanus was updated today.  Wound care provided by nursing staff.  Knee brace and crutches given.  Patient has been  given orthopedic referral for further evaluation of her left knee.  Patient informed that occult fracture, ligamentous and tendon injury may still be present.  I have advised to use of crutches, rice therapy.  Patient states understanding of care plan and is  agreeable.  At this time there does not appear to be any evidence of an acute emergency medical condition and the patient appears stable for discharge with appropriate outpatient follow up. Diagnosis was discussed with patient who verbalizes understanding of care plan and is agreeable to discharge. I have discussed return precautions with patient who verbalizes understanding of return precautions. Patient encouraged to follow-up with their PCP and ortho. All questions answered.  Patient has been discharged in good condition.   Note: Portions of this report may have been transcribed using voice recognition software. Every effort was made to ensure accuracy; however, inadvertent computerized transcription errors may still be present. Final Clinical Impressions(s) / ED Diagnoses   Final diagnoses:  Injury of left knee, initial encounter  Abrasion, left knee, initial encounter  Abrasion of left elbow, initial encounter  Abrasion of right elbow, initial encounter    ED Discharge Orders    None       Elizabeth Palau 02/10/19 1346    Mancel Bale, MD 02/12/19 [phone]

## 2019-02-10 NOTE — ED Notes (Signed)
Patient transported to X-ray 

## 2019-02-20 ENCOUNTER — Ambulatory Visit: Payer: Medicaid Other | Admitting: Obstetrics & Gynecology

## 2019-02-28 ENCOUNTER — Telehealth: Payer: Self-pay | Admitting: Neurology

## 2019-02-28 ENCOUNTER — Other Ambulatory Visit: Payer: Self-pay

## 2019-02-28 ENCOUNTER — Encounter: Payer: Self-pay | Admitting: Neurology

## 2019-02-28 ENCOUNTER — Ambulatory Visit: Payer: Medicaid Other | Admitting: Neurology

## 2019-02-28 VITALS — BP 94/61 | HR 51 | Temp 97.0°F | Ht 59.0 in | Wt 134.0 lb

## 2019-02-28 DIAGNOSIS — R413 Other amnesia: Secondary | ICD-10-CM

## 2019-02-28 DIAGNOSIS — F39 Unspecified mood [affective] disorder: Secondary | ICD-10-CM

## 2019-02-28 DIAGNOSIS — Z6379 Other stressful life events affecting family and household: Secondary | ICD-10-CM | POA: Diagnosis not present

## 2019-02-28 DIAGNOSIS — G441 Vascular headache, not elsewhere classified: Secondary | ICD-10-CM

## 2019-02-28 DIAGNOSIS — Z8489 Family history of other specified conditions: Secondary | ICD-10-CM | POA: Diagnosis not present

## 2019-02-28 DIAGNOSIS — R419 Unspecified symptoms and signs involving cognitive functions and awareness: Secondary | ICD-10-CM | POA: Diagnosis not present

## 2019-02-28 DIAGNOSIS — R5383 Other fatigue: Secondary | ICD-10-CM
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
## 2019-02-28 NOTE — Patient Instructions (Signed)
Stress Stress is a normal reaction to life events. Stress is what you feel when life demands more than you are used to, or more than you think you can handle. Some stress can be useful, such as studying for a test or meeting a deadline at work. Stress that occurs too often or for too long can cause problems. It can affect your emotional health and interfere with relationships and normal daily activities. Too much stress can weaken your body's defense system (immune system) and increase your risk for physical illness. If you already have a medical problem, stress can make it worse. What are the causes? All sorts of life events can cause stress. An event that causes stress for one person may not be stressful for another person. Major life events, whether positive or negative, commonly cause stress. Examples include:  Losing a job or starting a new job.  Losing a loved one.  Moving to a new town or home.  Getting married or divorced.  Having a baby.  Injury or illness. Less obvious life events can also cause stress, especially if they occur day after day or in combination with each other. Examples include:  Working long hours.  Driving in traffic.  Caring for children.  Being in debt.  Being in a difficult relationship. What are the signs or symptoms? Stress can cause emotional symptoms, including:  Anxiety. This is feeling worried, afraid, on edge, overwhelmed, or out of control.  Anger, including irritation or impatience.  Depression. This is feeling sad, down, helpless, or guilty.  Trouble focusing, remembering, or making decisions. Stress can cause physical symptoms, including:  Aches and pains. These may affect your head, neck, back, stomach, or other areas of your body.  Tight muscles or a clenched jaw.  Low energy.  Trouble sleeping. Stress can cause unhealthy behaviors, including:  Eating to feel better (overeating) or skipping meals.  Working too much or  putting off tasks.  Smoking, drinking alcohol, or using drugs to feel better. How is this diagnosed? Stress is diagnosed through an assessment by your health care provider. He or she may diagnose this condition based on:  Your symptoms and any stressful life events.  Your medical history.  Tests to rule out other causes of your symptoms. Depending on your condition, your health care provider may refer you to a specialist for further evaluation. How is this treated?  Stress management techniques are the recommended treatment for stress. Medicine is not typically recommended for the treatment of stress. Techniques to reduce your reaction to stressful life events include:  Stress identification. Monitor yourself for symptoms of stress and identify what causes stress for you. These skills may help you to avoid or prepare for stressful events.  Time management. Set your priorities, keep a calendar of events, and learn to say "no." Taking these actions can help you avoid making too many commitments. Techniques for coping with stress include:  Rethinking the problem. Try to think realistically about stressful events rather than ignoring them or overreacting. Try to find the positives in a stressful situation rather than focusing on the negatives.  Exercise. Physical exercise can release both physical and emotional tension. The key is to find a form of exercise that you enjoy and do it regularly.  Relaxation techniques. These relax the body and mind. The key is to find one or more that you enjoy and use the technique(s) regularly. Examples include: ? Meditation, deep breathing, or progressive relaxation techniques. ? Yoga or tai  chi. ? Biofeedback, mindfulness techniques, or journaling. ? Listening to music, being out in nature, or participating in other hobbies.  Practicing a healthy lifestyle. Eat a balanced diet, drink plenty of water, limit or avoid caffeine, and get plenty of sleep.   Having a strong support network. Spend time with family, friends, or other people you enjoy being around. Express your feelings and talk things over with someone you trust. Counseling or talk therapy with a mental health professional may be helpful if you are having trouble managing stress on your own. Follow these instructions at home: Lifestyle   Avoid drugs.  Do not use any products that contain nicotine or tobacco, such as cigarettes and e-cigarettes. If you need help quitting, ask your health care provider.  Limit alcohol intake to no more than 1 drink a day for nonpregnant women and 2 drinks a day for men. One drink equals 12 oz of beer, 5 oz of wine, or 1 oz of hard liquor.  Do not use alcohol or drugs to relax.  Eat a balanced diet that includes fresh fruits and vegetables, whole grains, lean meats, fish, eggs, and beans, and low-fat dairy. Avoid processed foods and foods high in added fat, sugar, and salt.  Exercise at least 30 minutes on 5 or more days each week.  Get 7-8 hours of sleep each night. General instructions   Practice stress management techniques as discussed with your health care provider.  Drink enough fluid to keep your urine clear or pale yellow.  Take over-the-counter and prescription medicines only as told by your health care provider.  Keep all follow-up visits as told by your health care provider. This is important. Contact a health care provider if:  Your symptoms get worse.  You have new symptoms.  You feel overwhelmed by your problems and can no longer manage them on your own. Get help right away if:  You have thoughts of hurting yourself or others. If you ever feel like you may hurt yourself or others, or have thoughts about taking your own life, get help right away. You can go to your nearest emergency department or call:  Your local emergency services (911 in the [place]  A suicide crisis helpline, such as the [place] [facility] at 2504721530. This is open 24 hours a day. Summary  Stress is a normal reaction to life events. It can cause problems if it happens too often or for too long.  Practicing stress management techniques is the best way to treat stress.  Counseling or talk therapy with a mental health professional may be helpful if you are having trouble managing stress on your own. This information is not intended to replace advice given to you by your health care provider. Make sure you discuss any questions you have with your health care provider. Document Released: 03/30/2001 Document Revised: 11/24/2016 Document Reviewed: 11/24/2016 Elsevier Interactive Patient Education  2019 [place].  Migraine Headache  A migraine headache is a very strong throbbing pain on one side or both sides of your head. Migraines can also cause other symptoms. Talk with your doctor about what things may bring on (trigger) your migraine headaches. Follow these instructions at home: Medicines  Take over-the-counter and prescription medicines only as told by your doctor.  Do not drive or use heavy machinery while taking prescription pain medicine.  To prevent or treat constipation while you are taking prescription pain medicine, your doctor may recommend that you: ? Drink enough fluid to  keep your pee (urine) clear or pale yellow. ? Take over-the-counter or prescription medicines. ? Eat foods that are high in fiber. These include fresh fruits and vegetables, whole grains, and beans. ? Limit foods that are high in fat and processed sugars. These include fried and sweet foods. Lifestyle  Avoid alcohol.  Do not use any products that contain nicotine or tobacco, such as cigarettes and e-cigarettes. If you need help quitting, ask your doctor.  Get at least 8 hours of sleep every night.  Limit your stress. General instructions   Keep a journal to find out what may bring on your migraines. For example, write  down: ? What you eat and drink. ? How much sleep you get. ? Any change in what you eat or drink. ? Any change in your medicines.  If you have a migraine: ? Avoid things that make your symptoms worse, such as bright lights. ? It may help to lie down in a dark, quiet room. ? Do not drive or use heavy machinery. ? Ask your doctor what activities are safe for you.  Keep all follow-up visits as told by your doctor. This is important. Contact a doctor if:  You get a migraine that is different or worse than your usual migraines. Get help right away if:  Your migraine gets very bad.  You have a fever.  You have a stiff neck.  You have trouble seeing.  Your muscles feel weak or like you cannot control them.  You start to lose your balance a lot.  You start to have trouble walking.  You pass out (faint). This information is not intended to replace advice given to you by your health care provider. Make sure you discuss any questions you have with your health care provider. Document Released: 07/13/2008 Document Revised: 06/28/2018 Document Reviewed: 03/22/2016 Elsevier Interactive Patient Education  2019 [employer] Inc[employer]  

## 2019-02-28 NOTE — Telephone Encounter (Signed)
medicaid order sent to GI. They will obtain the auth and reach out to the pt to schedule.  °

## 2019-03-01 ENCOUNTER — Ambulatory Visit: Payer: Medicaid Other | Admitting: Family Medicine

## 2019-03-03 LAB — COMPREHENSIVE METABOLIC PANEL
ALT: 13 IU/L (ref 0–32)
AST: 22 IU/L (ref 0–40)
Albumin/Globulin Ratio: 1.7 (ref 1.2–2.2)
Albumin: 4.4 g/dL (ref 3.8–4.8)
Alkaline Phosphatase: 59 IU/L (ref 39–117)
BUN/Creatinine Ratio: 13 (ref 9–23)
BUN: 9 mg/dL (ref 6–20)
Bilirubin Total: 0.4 mg/dL (ref 0.0–1.2)
CO2: 22 mmol/L (ref 20–29)
Calcium: 8.8 mg/dL (ref 8.7–10.2)
Chloride: 102 mmol/L (ref 96–106)
Creatinine, Ser: 0.69 mg/dL (ref 0.57–1.00)
GFR calc Af Amer: 134 mL/min/{1.73_m2} (ref >59)
GFR calc non Af Amer: 116 mL/min/{1.73_m2} (ref >59)
Globulin, Total: 2.6 g/dL (ref 1.5–4.5)
Glucose: 88 mg/dL (ref 65–99)
Potassium: 3.8 mmol/L (ref 3.5–5.2)
Sodium: 137 mmol/L (ref 134–144)
Total Protein: 7 g/dL (ref 6.0–8.5)

## 2019-03-03 LAB — CBC WITH DIFFERENTIAL/PLATELET
Basophils Absolute: 0 10*3/uL (ref 0.0–0.2)
Basos: 1 %
EOS (ABSOLUTE): 0.1 10*3/uL (ref 0.0–0.4)
Eos: 2 %
Hematocrit: 31.7 % — ABNORMAL LOW (ref 34.0–46.6)
Hemoglobin: 10.8 g/dL — ABNORMAL LOW (ref 11.1–15.9)
Immature Grans (Abs): 0 10*3/uL (ref 0.0–0.1)
Immature Granulocytes: 0 %
Lymphocytes Absolute: 2.1 10*3/uL (ref 0.7–3.1)
Lymphs: 40 %
MCH: 33.6 pg — ABNORMAL HIGH (ref 26.6–33.0)
MCHC: 34.1 g/dL (ref 31.5–35.7)
MCV: 99 fL — ABNORMAL HIGH (ref 79–97)
Monocytes Absolute: 0.3 10*3/uL (ref 0.1–0.9)
Monocytes: 6 %
Neutrophils Absolute: 2.6 10*3/uL (ref 1.4–7.0)
Neutrophils: 51 %
Platelets: 229 10*3/uL (ref 150–450)
RBC: 3.21 x10E6/uL — ABNORMAL LOW (ref 3.77–5.28)
RDW: 11.1 % — ABNORMAL LOW (ref 11.7–15.4)
WBC: 5.2 10*3/uL (ref 3.4–10.8)

## 2019-03-03 LAB — B12 AND FOLATE PANEL
Folate: 4.4 ng/mL (ref >3.0)
Vitamin B-12: 373 pg/mL (ref 232–1245)

## 2019-03-03 LAB — TSH: TSH: 0.679 u[IU]/mL (ref 0.450–4.500)

## 2019-03-03 LAB — METHYLMALONIC ACID, SERUM: Methylmalonic Acid: 119 nmol/L (ref 0–378)

## 2019-03-03 LAB — VITAMIN B1: Thiamine: 69.6 nmol/L (ref 66.5–200.0)

## 2019-03-05 ENCOUNTER — Telehealth: Payer: Self-pay | Admitting: *Deleted

## 2019-03-05 NOTE — Telephone Encounter (Signed)
Spoke with pt and discussed lab results. Pt sees a specialist for her iron (has received infusions in the past) so she will reach out to them. Pt aware hgb is 10.8. Next steps: MRI on 5/27 and EEG on 5/28. Pt understands we will call when we have those results. She verbalized appreciation for the call.

## 2019-03-05 NOTE — Telephone Encounter (Signed)
-----   Message from Anson Fret, MD sent at 03/04/2019  4:56 PM EDT ----- Patient is anemic, looks like it is due to irn-deficiency and that it is stable. But this can cause fatigue. I would follow up with primary care. Otherwise labs are normal.  thanks

## 2019-03-06 ENCOUNTER — Telehealth: Payer: Self-pay | Admitting: Family

## 2019-03-06 NOTE — Telephone Encounter (Signed)
Called and spoke with patient regarding appt needed for IRON.  Per Eleonore Chiquito, NP patient will need labs & follow up visit prior to be scheduled for any iron infusion per 5/19 staff message

## 2019-03-14 ENCOUNTER — Inpatient Hospital Stay: Admission: RE | Admit: 2019-03-14 | Payer: Medicaid Other | Source: Ambulatory Visit

## 2019-03-15 ENCOUNTER — Other Ambulatory Visit: Payer: Self-pay

## 2019-03-15 ENCOUNTER — Ambulatory Visit (INDEPENDENT_AMBULATORY_CARE_PROVIDER_SITE_OTHER): Payer: Medicaid Other | Admitting: Neurology

## 2019-03-15 DIAGNOSIS — R413 Other amnesia: Secondary | ICD-10-CM

## 2019-03-15 DIAGNOSIS — R410 Disorientation, unspecified: Secondary | ICD-10-CM | POA: Diagnosis not present

## 2019-03-15 DIAGNOSIS — G441 Vascular headache, not elsewhere classified: Secondary | ICD-10-CM

## 2019-03-15 DIAGNOSIS — Z8489 Family history of other specified conditions: Secondary | ICD-10-CM

## 2019-03-15 DIAGNOSIS — R5383 Other fatigue: Secondary | ICD-10-CM

## 2019-03-15 DIAGNOSIS — R419 Unspecified symptoms and signs involving cognitive functions and awareness: Secondary | ICD-10-CM

## 2019-03-15 NOTE — Procedures (Signed)
      History:  Jennifer Combs is a 33 year old patient with a history of migraine headaches but also history of memory loss over the last 16 years.  The memory loss issue began following her brother's death.  The patient is being evaluated for this issue.  This is a routine EEG.  No skull defects are noted.  Medications include fentanyl, Cymbalta, Prilosec and Tylenol.   EEG classification: Normal awake and asleep  Description of the recording: The background rhythms of this recording consists of a fairly well modulated medium amplitude background activity of 9 Hz. As the record progresses, the patient initially is in the waking state, but appears to enter the early stage II sleep during the recording, with rudimentary sleep spindles and vertex sharp wave activity seen. During the wakeful state, photic stimulation is performed, and this results in a bilateral and symmetric photic driving response. Hyperventilation was also performed, and this results in a minimal buildup of the background rhythm activities without significant slowing seen. At no time during the recording does there appear to be evidence of spike or spike wave discharges or evidence of focal slowing. EKG monitor was not present on the EEG recording.  Impression: This is a normal EEG recording in the waking and sleeping state. No evidence of ictal or interictal discharges were seen at any time during the recording.

## 2019-03-19 ENCOUNTER — Telehealth: Payer: Self-pay | Admitting: *Deleted

## 2019-03-19 ENCOUNTER — Telehealth: Payer: Self-pay | Admitting: Family

## 2019-03-19 NOTE — Telephone Encounter (Signed)
Called pt and let her know her EEG was normal, no seizure activity noted. She verbalized understanding. She was tearful during the call and stated CPS took her kids; her mom told them she was bipolar. She stated she didn't need anything and would be ok. She was very appreciative for the call. Her MRI is 03/30/2019.

## 2019-03-19 NOTE — Telephone Encounter (Signed)
Called and SWP regarding appointments r/s from 6/4   Due to provider PAL

## 2019-03-19 NOTE — Telephone Encounter (Signed)
-----   Message from Anson Fret, MD sent at 03/16/2019 12:42 PM EDT ----- EEG normal thanks

## 2019-03-22 ENCOUNTER — Other Ambulatory Visit: Payer: Medicaid Other

## 2019-03-22 ENCOUNTER — Ambulatory Visit: Payer: Medicaid Other | Admitting: Family

## 2019-03-23 ENCOUNTER — Inpatient Hospital Stay: Payer: Medicaid Other | Admitting: Family

## 2019-03-23 ENCOUNTER — Other Ambulatory Visit: Payer: Self-pay | Admitting: Family

## 2019-03-23 ENCOUNTER — Inpatient Hospital Stay: Payer: Medicaid Other | Attending: Family

## 2019-03-23 DIAGNOSIS — D500 Iron deficiency anemia secondary to blood loss (chronic): Secondary | ICD-10-CM

## 2019-03-26 DIAGNOSIS — F4310 Post-traumatic stress disorder, unspecified: Secondary | ICD-10-CM | POA: Diagnosis not present

## 2019-03-30 ENCOUNTER — Ambulatory Visit
Admission: RE | Admit: 2019-03-30 | Discharge: 2019-03-30 | Disposition: A | Discharge status: Home / Self Care | Payer: Medicaid Other | Source: Ambulatory Visit | Attending: Neurology | Admitting: Neurology

## 2019-03-30 DIAGNOSIS — G441 Vascular headache, not elsewhere classified: Secondary | ICD-10-CM

## 2019-03-30 DIAGNOSIS — R5383 Other fatigue: Secondary | ICD-10-CM

## 2019-03-30 DIAGNOSIS — R419 Unspecified symptoms and signs involving cognitive functions and awareness: Secondary | ICD-10-CM

## 2019-03-30 DIAGNOSIS — Z8489 Family history of other specified conditions: Secondary | ICD-10-CM

## 2019-03-30 DIAGNOSIS — R413 Other amnesia: Secondary | ICD-10-CM

## 2019-03-30 MED ORDER — GADOBENATE DIMEGLUMINE 529 MG/ML IV SOLN
12.0000 mL | Freq: Once | INTRAVENOUS | Status: AC | PRN
  Administered 2019-03-30: 12 mL via INTRAVENOUS

## 2019-04-05 ENCOUNTER — Telehealth: Payer: Self-pay | Admitting: *Deleted

## 2019-04-05 DIAGNOSIS — F4310 Post-traumatic stress disorder, unspecified: Secondary | ICD-10-CM | POA: Diagnosis not present

## 2019-04-05 NOTE — Telephone Encounter (Signed)
Attempted to reach patient with MRI results. Unable to leave message, voice MB full.

## 2019-04-05 NOTE — Telephone Encounter (Signed)
Returned call to patient and informed her MRI result is normal. She asked what are her next steps. I advised her labs were good except for low iron which she has treated with infusions. Her EEG was normal. She stated she is due for labs and iron infusion soon, will call us with those lab results. She is seeing a therapist now, has second appt today.  She stated she will call with new lab results and discuss next steps. She  verbalized understanding, appreciation.

## 2019-04-05 NOTE — Telephone Encounter (Signed)
Pt has called RN back for results, she is asking for a call back

## 2019-05-09 DIAGNOSIS — F4310 Post-traumatic stress disorder, unspecified: Secondary | ICD-10-CM | POA: Diagnosis not present

## 2019-05-10 DIAGNOSIS — F4310 Post-traumatic stress disorder, unspecified: Secondary | ICD-10-CM | POA: Diagnosis not present

## 2019-05-24 ENCOUNTER — Other Ambulatory Visit: Payer: Self-pay

## 2019-05-25 ENCOUNTER — Ambulatory Visit: Payer: Medicaid Other | Admitting: Obstetrics & Gynecology

## 2019-05-25 NOTE — Progress Notes (Deleted)
33 y.o. [id number] Married Black or [place] American female here for annual exam.    Patient's last menstrual period was 12/13/2017 (exact date).          Sexually active: {yes no:314532}  The current method of family planning is status post hysterectomy.    Exercising: {yes no:314532}  {types:19826} Smoker:  {YES [id number]  Health Maintenance: Pap:  04/04/17 Neg. HR HPV:neg  History of abnormal Pap:  no MMG:  Never Colonoscopy:  06/06/17 f/u age 79 TDaP:  2020 Hep C testing: 06/01/18 Neg  Screening Labs: ***   reports that she has been smoking. She has been smoking about 0.10 packs per day. She has never used smokeless tobacco. She reports current alcohol use. She reports current drug use. Frequency: 28.00 times per week. Drug: Marijuana.  Past Medical History:  Diagnosis Date  . Allergy   . Anemia   . Anxiety   . Depression   . Ectopic pregnancy, tubal   . GERD (gastroesophageal reflux disease)   . H. pylori infection   . IBS (irritable bowel syndrome)   . Internal hemorrhoids   . Ovarian cyst bil  . PONV (postoperative nausea and vomiting)   . Trichomonas infection     Past Surgical History:  Procedure Laterality Date  . CYSTOSCOPY N/A 12/26/2017   Procedure: CYSTOSCOPY;  Surgeon: Megan Salon, MD;  Location: [facility];  Service: Gynecology;  Laterality: N/A;  . TOTAL LAPAROSCOPIC HYSTERECTOMY WITH SALPINGECTOMY Bilateral 12/26/2017   Procedure: TOTAL LAPAROSCOPIC HYSTERECTOMY WITH SALPINGECTOMY;  Surgeon: Megan Salon, MD;  Location: [facility];  Service: Gynecology;  Laterality: Bilateral;  . TUBAL LIGATION      Current Outpatient Medications  Medication Sig Dispense Refill  . acetaminophen (TYLENOL) 500 MG tablet Take 500 mg by mouth every 6 (six) hours as needed (knee pain).    Marland Kitchen dicyclomine (BENTYL) 10 MG capsule Take 1 capsule 30 minutes ac 2 meals of the day and at bedtime 90 capsule 0  . DULoxetine (CYMBALTA) 20 MG capsule Take 1  capsule (20 mg total) by mouth daily. 30 capsule 3  . omeprazole (PRILOSEC) 20 MG capsule Take 1 capsule (20 mg total) by mouth 2 (two) times daily before a meal. 60 capsule 3   No current facility-administered medications for this visit.     Family History  Problem Relation Age of Onset  . Endometriosis Mother   . Anemia Maternal Grandmother   . Clotting disorder Maternal Grandmother   . Thyroid disease Maternal Grandmother   . Migraines Maternal Grandmother        "something wrong with her eyes"  . Migraines Other   . Schizophrenia Other     Review of Systems  Exam:   LMP 12/13/2017 (Exact Date)   Height:      Ht Readings from Last 3 Encounters:  02/28/19 4\' 11"  (1.499 m)  02/10/19 4\' 11"  (1.499 m)  12/26/18 4' 11.5" (1.511 m)    General appearance: alert, cooperative and appears stated age Head: Normocephalic, without obvious abnormality, atraumatic Neck: no adenopathy, supple, symmetrical, trachea midline and thyroid {EXAM; THYROID:18604} Lungs: clear to auscultation bilaterally Breasts: {Exam; breast:13139::"normal appearance, no masses or tenderness"} Heart: regular rate and rhythm Abdomen: soft, non-tender; bowel sounds normal; no masses,  no organomegaly Extremities: extremities normal, atraumatic, no cyanosis or edema Skin: Skin color, texture, turgor normal. No rashes or lesions Lymph nodes: Cervical, supraclavicular, and axillary nodes normal. No abnormal inguinal nodes palpated Neurologic: Grossly normal  Pelvic: External genitalia:  no lesions              Urethra:  normal appearing urethra with no masses, tenderness or lesions              Bartholins and Skenes: normal                 Vagina: normal appearing vagina with normal color and discharge, no lesions              Cervix: {exam; cervix:14595}              Pap taken: {yes no:314532} Bimanual Exam:  Uterus:  {exam; uterus:12215}              Adnexa: {exam; adnexa:12223}                Rectovaginal: Confirms               Anus:  normal sphincter tone, no lesions  Chaperone was present for exam.  A:  Well Woman with normal exam  P:   {plan; gyn:5269::"mammogram","pap smear","return annually or prn"}

## 2019-05-31 ENCOUNTER — Other Ambulatory Visit: Payer: Self-pay

## 2019-06-04 ENCOUNTER — Encounter: Payer: Self-pay | Admitting: *Deleted

## 2019-06-04 ENCOUNTER — Ambulatory Visit: Payer: Medicaid Other | Admitting: Obstetrics & Gynecology

## 2019-06-04 NOTE — Progress Notes (Deleted)
33 y.o. [id number] Married Black or [place] female here for annual exam.    Patient's last menstrual period was 12/13/2017 (exact date).          Sexually active: {yes no:314532}  The current method of family planning is status post hysterectomy.    Exercising: {yes no:314532}  {types:19826} Smoker:  {YES [id number]:22349}  Health Maintenance: Pap:  04/04/17 neg. HR HPV:neg  History of abnormal Pap:  no Colonoscopy:  06/06/17 normal  TDaP:  2020 Screening Labs: ***   reports that she has been smoking. She has been smoking about 0.10 packs per day. She has never used smokeless tobacco. She reports current alcohol use. She reports current drug use. Frequency: 28.00 times per week. Drug: Marijuana.  Past Medical History:  Diagnosis Date  . Allergy   . Anemia   . Anxiety   . Depression   . Ectopic pregnancy, tubal   . GERD (gastroesophageal reflux disease)   . H. pylori infection   . IBS (irritable bowel syndrome)   . Internal hemorrhoids   . Ovarian cyst bil  . PONV (postoperative nausea and vomiting)   . Trichomonas infection     Past Surgical History:  Procedure Laterality Date  . CYSTOSCOPY N/A 12/26/2017   Procedure: CYSTOSCOPY;  Surgeon: Jerene BearsMiller, Mary S, MD;  [facility];  Service: Gynecology;  Laterality: N/A;  . TOTAL LAPAROSCOPIC HYSTERECTOMY WITH SALPINGECTOMY Bilateral 12/26/2017   Procedure: TOTAL LAPAROSCOPIC HYSTERECTOMY WITH SALPINGECTOMY;  Surgeon: Jerene BearsMiller, Mary S, MD;  [facility] [place];  Service: Gynecology;  Laterality: Bilateral;  . TUBAL LIGATION      Current Outpatient Medications  Medication Sig Dispense Refill  . acetaminophen (TYLENOL) 500 MG tablet Take 500 mg by mouth every 6 (six) hours as needed (knee pain).    Marland Kitchen. dicyclomine (BENTYL) 10 MG capsule Take 1 capsule 30 minutes ac 2 meals of the day and at bedtime 90 capsule 0  . DULoxetine (CYMBALTA) 20 MG capsule Take 1 capsule (20 mg total) by mouth daily. 30  capsule 3  . omeprazole (PRILOSEC) 20 MG capsule Take 1 capsule (20 mg total) by mouth 2 (two) times daily before a meal. 60 capsule 3   No current facility-administered medications for this visit.     Family History  Problem Relation Age of Onset  . Endometriosis Mother   . Anemia Maternal Grandmother   . Clotting disorder Maternal Grandmother   . Thyroid disease Maternal Grandmother   . Migraines Maternal Grandmother        "something wrong with her eyes"  . Migraines Other   . Schizophrenia Other     Review of Systems  Exam:   LMP 12/13/2017 (Exact Date)   Height:      Ht Readings from Last 3 Encounters:  02/28/19 4\' 11"  (1.499 m)  02/10/19 4\' 11"  (1.499 m)  12/26/18 4' 11.5" (1.511 m)    General appearance: alert, cooperative and appears stated age Head: Normocephalic, without obvious abnormality, atraumatic Neck: no adenopathy, supple, symmetrical, trachea midline and thyroid {EXAM; THYROID:18604} Lungs: clear to auscultation bilaterally Breasts: {Exam; breast:13139::"normal appearance, no masses or tenderness"} Heart: regular rate and rhythm Abdomen: soft, non-tender; bowel sounds normal; no masses,  no organomegaly Extremities: extremities normal, atraumatic, no cyanosis or edema Skin: Skin color, texture, turgor normal. No rashes or lesions Lymph nodes: Cervical, supraclavicular, and axillary nodes normal. No abnormal inguinal nodes palpated Neurologic: Grossly normal   Pelvic: External genitalia:  no lesions  Urethra:  normal appearing urethra with no masses, tenderness or lesions              Bartholins and Skenes: normal                 Vagina: normal appearing vagina with normal color and discharge, no lesions              Cervix: {exam; cervix:14595}              Pap taken: {yes no:314532} Bimanual Exam:  Uterus:  {exam; uterus:12215}              Adnexa: {exam; adnexa:12223}               Rectovaginal: Confirms               Anus:  normal  sphincter tone, no lesions  Chaperone was present for exam.  A:  Well Woman with normal exam  P:   {plan; gyn:5269::"mammogram","pap smear","return annually or prn"}

## 2019-07-11 ENCOUNTER — Other Ambulatory Visit: Payer: Self-pay

## 2019-07-11 DIAGNOSIS — Z20822 Contact with and (suspected) exposure to covid-19: Secondary | ICD-10-CM

## 2019-07-11 DIAGNOSIS — R6889 Other general symptoms and signs: Secondary | ICD-10-CM | POA: Diagnosis not present

## 2019-07-12 LAB — NOVEL CORONAVIRUS, NAA: SARS-CoV-2, NAA: NOT DETECTED

## 2019-08-15 DIAGNOSIS — F331 Major depressive disorder, recurrent, moderate: Secondary | ICD-10-CM | POA: Diagnosis not present

## 2019-09-12 DIAGNOSIS — F331 Major depressive disorder, recurrent, moderate: Secondary | ICD-10-CM | POA: Diagnosis not present

## 2019-09-19 DIAGNOSIS — F331 Major depressive disorder, recurrent, moderate: Secondary | ICD-10-CM | POA: Diagnosis not present

## 2019-09-28 DIAGNOSIS — F331 Major depressive disorder, recurrent, moderate: Secondary | ICD-10-CM | POA: Diagnosis not present

## 2019-10-11 DIAGNOSIS — F331 Major depressive disorder, recurrent, moderate: Secondary | ICD-10-CM | POA: Diagnosis not present

## 2019-10-26 DIAGNOSIS — F331 Major depressive disorder, recurrent, moderate: Secondary | ICD-10-CM | POA: Diagnosis not present

## 2019-11-17 DIAGNOSIS — F331 Major depressive disorder, recurrent, moderate: Secondary | ICD-10-CM | POA: Diagnosis not present

## 2019-11-22 DIAGNOSIS — F331 Major depressive disorder, recurrent, moderate: Secondary | ICD-10-CM | POA: Diagnosis not present

## 2019-11-30 DIAGNOSIS — F331 Major depressive disorder, recurrent, moderate: Secondary | ICD-10-CM | POA: Diagnosis not present

## 2019-12-14 DIAGNOSIS — F331 Major depressive disorder, recurrent, moderate: Secondary | ICD-10-CM | POA: Diagnosis not present

## 2019-12-16 DIAGNOSIS — F331 Major depressive disorder, recurrent, moderate: Secondary | ICD-10-CM | POA: Diagnosis not present

## 2019-12-26 ENCOUNTER — Ambulatory Visit: Payer: Medicaid Other | Attending: Internal Medicine

## 2019-12-26 DIAGNOSIS — Z20822 Contact with and (suspected) exposure to covid-19: Secondary | ICD-10-CM | POA: Diagnosis not present

## 2019-12-27 LAB — NOVEL CORONAVIRUS, NAA: SARS-CoV-2, NAA: NOT DETECTED

## 2020-01-04 DIAGNOSIS — F331 Major depressive disorder, recurrent, moderate: Secondary | ICD-10-CM | POA: Diagnosis not present

## 2020-01-17 DIAGNOSIS — F331 Major depressive disorder, recurrent, moderate: Secondary | ICD-10-CM | POA: Diagnosis not present

## 2020-02-05 DIAGNOSIS — F331 Major depressive disorder, recurrent, moderate: Secondary | ICD-10-CM | POA: Diagnosis not present

## 2020-02-14 ENCOUNTER — Ambulatory Visit: Payer: Medicaid Other | Admitting: Family Medicine

## 2020-02-19 DIAGNOSIS — F331 Major depressive disorder, recurrent, moderate: Secondary | ICD-10-CM | POA: Diagnosis not present

## 2020-03-04 DIAGNOSIS — F331 Major depressive disorder, recurrent, moderate: Secondary | ICD-10-CM | POA: Diagnosis not present

## 2020-03-19 DIAGNOSIS — F331 Major depressive disorder, recurrent, moderate: Secondary | ICD-10-CM | POA: Diagnosis not present

## 2020-04-01 DIAGNOSIS — F331 Major depressive disorder, recurrent, moderate: Secondary | ICD-10-CM | POA: Diagnosis not present

## 2020-04-16 DIAGNOSIS — F331 Major depressive disorder, recurrent, moderate: Secondary | ICD-10-CM | POA: Diagnosis not present

## 2020-04-29 DIAGNOSIS — F331 Major depressive disorder, recurrent, moderate: Secondary | ICD-10-CM | POA: Diagnosis not present

## 2020-05-06 DIAGNOSIS — F331 Major depressive disorder, recurrent, moderate: Secondary | ICD-10-CM | POA: Diagnosis not present

## 2020-05-21 DIAGNOSIS — F331 Major depressive disorder, recurrent, moderate: Secondary | ICD-10-CM | POA: Diagnosis not present

## 2020-05-27 DIAGNOSIS — F331 Major depressive disorder, recurrent, moderate: Secondary | ICD-10-CM | POA: Diagnosis not present

## 2020-06-12 DIAGNOSIS — F331 Major depressive disorder, recurrent, moderate: Secondary | ICD-10-CM | POA: Diagnosis not present

## 2020-08-18 ENCOUNTER — Other Ambulatory Visit: Payer: Medicaid Other

## 2020-08-18 DIAGNOSIS — Z20822 Contact with and (suspected) exposure to covid-19: Secondary | ICD-10-CM

## 2020-08-19 LAB — SARS-COV-2, NAA 2 DAY TAT

## 2020-08-19 LAB — NOVEL CORONAVIRUS, NAA: SARS-CoV-2, NAA: NOT DETECTED

## 2020-12-09 ENCOUNTER — Ambulatory Visit
Admission: EM | Admit: 2020-12-09 | Discharge: 2020-12-09 | Disposition: A | Discharge status: Home / Self Care | Payer: Medicaid Other | Attending: Family Medicine | Admitting: Family Medicine

## 2020-12-09 ENCOUNTER — Encounter: Payer: Self-pay | Admitting: Emergency Medicine

## 2020-12-09 ENCOUNTER — Other Ambulatory Visit: Payer: Self-pay

## 2020-12-09 DIAGNOSIS — A599 Trichomoniasis, unspecified: Secondary | ICD-10-CM | POA: Diagnosis not present

## 2020-12-09 LAB — POCT URINALYSIS DIP (MANUAL ENTRY)
Bilirubin, UA: NEGATIVE
Blood, UA: NEGATIVE
Glucose, UA: NEGATIVE mg/dL
Ketones, POC UA: NEGATIVE mg/dL
Leukocytes, UA: NEGATIVE
Nitrite, UA: NEGATIVE
Protein Ur, POC: 30 mg/dL — AB
Spec Grav, UA: 1.02 (ref 1.010–1.025)
Urobilinogen, UA: 0.2 E.U./dL
pH, UA: 7 (ref 5.0–8.0)

## 2020-12-09 MED ORDER — METRONIDAZOLE 500 MG PO TABS: 500.0000 mg | ORAL_TABLET | Freq: Two times a day (BID) | ORAL | 0 refills | Status: DC

## 2020-12-09 NOTE — ED Provider Notes (Signed)
[facility]    CSN: [id number] Arrival date & time: 12/09/20  0920      History   Chief Complaint Chief Complaint  Patient presents with  . Exposure to STD    HPI Jennifer Combs is a 35 y.o. female.   HPI     Past Medical History:  Diagnosis Date  . Allergy   . Anemia   . Anxiety   . Depression   . Ectopic pregnancy, tubal   . GERD (gastroesophageal reflux disease)   . H. pylori infection   . IBS (irritable bowel syndrome)   . Internal hemorrhoids   . Ovarian cyst bil  . PONV (postoperative nausea and vomiting)   . Trichomonas infection     Patient Active Problem List   Diagnosis Date Noted  . Other stressful life events affecting family and household 02/28/2019  . Mood disorder (HCC) 02/28/2019  . Menorrhagia 12/26/2017  . IDA (iron deficiency anemia) 05/19/2017  . Epigastric pain 04/26/2017  . Macrocytic anemia 04/26/2017  . Herpes genitalia 04/08/2011    Past Surgical History:  Procedure Laterality Date  . CYSTOSCOPY N/A 12/26/2017   Procedure: CYSTOSCOPY;  Surgeon: Jerene Bears, MD;  Location: [facility];  Service: Gynecology;  Laterality: N/A;  . TOTAL LAPAROSCOPIC HYSTERECTOMY WITH SALPINGECTOMY Bilateral 12/26/2017   Procedure: TOTAL LAPAROSCOPIC HYSTERECTOMY WITH SALPINGECTOMY;  Surgeon: Jerene Bears, MD;  Location: [facility];  Service: Gynecology;  Laterality: Bilateral;  . TUBAL LIGATION      OB History    Gravida  6   Para  4   Term  3   Preterm  1   AB  2   Living  4     SAB      IAB  1   Ectopic  1   Multiple      Live Births  4            Home Medications    Prior to Admission medications   Medication Sig Start Date End Date Taking? Authorizing Provider  acetaminophen (TYLENOL) 500 MG tablet Take 500 mg by mouth every 6 (six) hours as needed (knee pain).    [provider]  dicyclomine (BENTYL) 10 MG capsule Take 1 capsule 30 minutes ac 2 meals of the  day and at bedtime 01/25/19   Lynann Bologna, MD  DULoxetine (CYMBALTA) 20 MG capsule Take 1 capsule (20 mg total) by mouth daily. 12/26/18   Jerene Bears, MD  omeprazole (PRILOSEC) 20 MG capsule Take 1 capsule (20 mg total) by mouth 2 (two) times daily before a meal. 10/03/18   Pyrtle, Carie Caddy, MD    Family History Family History  Problem Relation Age of Onset  . Endometriosis Mother   . Anemia Maternal Grandmother   . Clotting disorder Maternal Grandmother   . Thyroid disease Maternal Grandmother   . Migraines Maternal Grandmother        "something wrong with her eyes"  . Migraines Other   . Schizophrenia Other     Social History Social History   Tobacco Use  . Smoking status: Current Some Day Smoker    Packs/day: 0.10  . Smokeless tobacco: Never Used  . Tobacco comment: patient down to 2 cigarettes per day  Vaping Use  . Vaping Use: Never used  Substance Use Topics  . Alcohol use: Yes    Comment: very rare  . Drug use: Yes    Frequency: 28.0 times per  week    Types: Marijuana    Comment: uses for pain relief, 4 per day     Allergies   Adhesive [tape], Citrus, and Tomato   Review of Systems Review of Systems Pertinent negatives listed in HPI  Physical Exam Triage Vital Signs ED Triage Vitals  Enc Vitals Group     BP 12/09/20 1012 101/66     Pulse Rate 12/09/20 1012 67     Resp 12/09/20 1012 20     Temp 12/09/20 1012 98.4 F (36.9 C)     Temp Source 12/09/20 1012 Oral     SpO2 12/09/20 1012 96 %     Weight --      Height --      Head Circumference --      Peak Flow --      Pain Score 12/09/20 1019 0     Pain Loc --      Pain Edu? --      Excl. in GC? --    No data found.  Updated Vital Signs BP 101/66 (BP Location: Left Arm)   Pulse 67   Temp 98.4 F (36.9 C) (Oral)   Resp 20   LMP 12/13/2017 (Exact Date)   SpO2 96%   Visual Acuity Right Eye Distance:   Left Eye Distance:   Bilateral Distance:    Right Eye Near:   Left Eye Near:     Bilateral Near:     Physical Exam General appearance: alert, well developed, well nourished, cooperative and in no distress Head: Normocephalic, without obvious abnormality, atraumatic Respiratory: Respirations even and unlabored, normal respiratory rate Heart: rate and rhythm normal. No gallop or murmurs noted on exam  Abdomen: BS +, no distention, no rebound tenderness, or no mass Extremities: No gross deformities Skin: Skin color, texture, turgor normal. No rashes seen  Psych: Appropriate mood and affect. Neurologic: Mental status: Alert, oriented to person, place, and time, thought content appropriate. Vaginal cytology self collected UC Treatments / Results  Labs (all labs ordered are listed, but only abnormal results are displayed) Labs Reviewed  POCT URINALYSIS DIP (MANUAL ENTRY) - Abnormal; Notable for the following components:      Result Value   Protein Ur, POC =30 (*)    All other components within normal limits  CERVICOVAGINAL ANCILLARY ONLY    EKG   Radiology No results found.  Procedures Procedures (including critical care time)  Medications Ordered in UC Medications - No data to display  Initial Impression / Assessment and Plan / UC Course  I have reviewed the triage vital signs and the nursing notes.  Pertinent labs & imaging results that were available during my care of the patient were reviewed by me and considered in my medical decision making (see chart for details).    Patient in today for STD treatment following exposure to trichomonas.  Will cover with metronidazole.  Vaginal cytology pending.  Patient has no active MyChart was advised if any additional treatment is warranted we will notify her via MyChart.  She is asymptomatic today. Final Clinical Impressions(s) / UC Diagnoses   Final diagnoses:  Trichomoniasis     Discharge Instructions     Your test results will be available within 3-5 days. If any changes in treatment warranted we will  let you know.     ED Prescriptions    Medication Sig Dispense Auth. Provider   metroNIDAZOLE (FLAGYL) 500 MG tablet  (Status: Discontinued) Take 1 tablet (500 mg total) by mouth  2 (two) times daily with a meal. DO NOT CONSUME ALCOHOL WHILE TAKING THIS MEDICATION. 14 tablet Bing Neighbors, FNP   metroNIDAZOLE (FLAGYL) 500 MG tablet Take 1 tablet (500 mg total) by mouth 2 (two) times daily with a meal. DO NOT CONSUME ALCOHOL WHILE TAKING THIS MEDICATION. 14 tablet Bing Neighbors, FNP     PDMP not reviewed this encounter.   Bing Neighbors, FNP 12/09/20 1041

## 2020-12-09 NOTE — ED Triage Notes (Signed)
Patient presents due to STD Exposure.   Patient states that she was told by her partner that they have "Trich".   Patient has no complaints. Patient denies any ABD pain or vaginal discharge.

## 2020-12-09 NOTE — Discharge Instructions (Addendum)
Your test results will be available within 3-5 days. If any changes in treatment warranted we will let you know.

## 2020-12-10 LAB — CERVICOVAGINAL ANCILLARY ONLY
Bacterial Vaginitis (gardnerella): POSITIVE — AB
Candida Glabrata: NEGATIVE
Candida Vaginitis: NEGATIVE
Chlamydia: NEGATIVE
Comment: NEGATIVE
Comment: NEGATIVE
Comment: NEGATIVE
Comment: NEGATIVE
Comment: NEGATIVE
Comment: NORMAL
Neisseria Gonorrhea: NEGATIVE
Trichomonas: POSITIVE — AB

## 2021-01-16 ENCOUNTER — Telehealth: Payer: Self-pay

## 2021-01-16 NOTE — Telephone Encounter (Signed)
Attempted to reach Jennifer Combs today to get her scheduled for a phone visit with the Managed Medicaid Team. No one answered and there was not a voicemail.

## 2021-08-18 DIAGNOSIS — Z419 Encounter for procedure for purposes other than remedying health state, unspecified: Secondary | ICD-10-CM | POA: Diagnosis not present

## 2021-09-17 DIAGNOSIS — Z419 Encounter for procedure for purposes other than remedying health state, unspecified: Secondary | ICD-10-CM | POA: Diagnosis not present

## 2021-10-18 DIAGNOSIS — Z419 Encounter for procedure for purposes other than remedying health state, unspecified: Secondary | ICD-10-CM | POA: Diagnosis not present

## 2021-11-18 DIAGNOSIS — Z419 Encounter for procedure for purposes other than remedying health state, unspecified: Secondary | ICD-10-CM | POA: Diagnosis not present

## 2021-12-16 DIAGNOSIS — Z419 Encounter for procedure for purposes other than remedying health state, unspecified: Secondary | ICD-10-CM | POA: Diagnosis not present

## 2022-01-16 DIAGNOSIS — Z419 Encounter for procedure for purposes other than remedying health state, unspecified: Secondary | ICD-10-CM | POA: Diagnosis not present

## 2022-02-15 DIAGNOSIS — Z419 Encounter for procedure for purposes other than remedying health state, unspecified: Secondary | ICD-10-CM | POA: Diagnosis not present

## 2022-03-18 DIAGNOSIS — Z419 Encounter for procedure for purposes other than remedying health state, unspecified: Secondary | ICD-10-CM | POA: Diagnosis not present

## 2022-04-15 ENCOUNTER — Ambulatory Visit
Admission: EM | Admit: 2022-04-15 | Discharge: 2022-04-15 | Payer: Medicaid Other | Attending: Emergency Medicine | Admitting: Emergency Medicine

## 2022-04-15 ENCOUNTER — Encounter (HOSPITAL_COMMUNITY): Payer: Self-pay | Admitting: Emergency Medicine

## 2022-04-15 ENCOUNTER — Encounter: Payer: Self-pay | Admitting: Family

## 2022-04-15 ENCOUNTER — Emergency Department (HOSPITAL_COMMUNITY)
Admission: EM | Admit: 2022-04-15 | Discharge: 2022-04-15 | Discharge status: Against Medical Advice | Payer: Medicaid Other | Attending: Student | Admitting: Student

## 2022-04-15 DIAGNOSIS — R102 Pelvic and perineal pain: Secondary | ICD-10-CM | POA: Insufficient documentation

## 2022-04-15 DIAGNOSIS — Z5321 Procedure and treatment not carried out due to patient leaving prior to being seen by health care provider: Secondary | ICD-10-CM | POA: Insufficient documentation

## 2022-04-15 DIAGNOSIS — R1032 Left lower quadrant pain: Secondary | ICD-10-CM | POA: Diagnosis not present

## 2022-04-15 DIAGNOSIS — K0889 Other specified disorders of teeth and supporting structures: Secondary | ICD-10-CM | POA: Diagnosis not present

## 2022-04-15 DIAGNOSIS — Z113 Encounter for screening for infections with a predominantly sexual mode of transmission: Secondary | ICD-10-CM | POA: Diagnosis not present

## 2022-04-15 LAB — POCT URINALYSIS DIP (MANUAL ENTRY)
Bilirubin, UA: NEGATIVE
Blood, UA: NEGATIVE
Glucose, UA: NEGATIVE mg/dL
Ketones, POC UA: NEGATIVE mg/dL
Leukocytes, UA: NEGATIVE
Nitrite, UA: NEGATIVE
Protein Ur, POC: NEGATIVE mg/dL
Spec Grav, UA: 1.02 (ref 1.010–1.025)
Urobilinogen, UA: 0.2 E.U./dL
pH, UA: 6 (ref 5.0–8.0)

## 2022-04-15 LAB — COMPREHENSIVE METABOLIC PANEL
ALT: 12 U/L (ref 0–44)
AST: 21 U/L (ref 15–41)
Albumin: 4 g/dL (ref 3.5–5.0)
Alkaline Phosphatase: 54 U/L (ref 38–126)
Anion gap: 7 (ref 5–15)
BUN: 11 mg/dL (ref 6–20)
CO2: 25 mmol/L (ref 22–32)
Calcium: 9.1 mg/dL (ref 8.9–10.3)
Chloride: 107 mmol/L (ref 98–111)
Creatinine, Ser: 0.83 mg/dL (ref 0.44–1.00)
GFR, Estimated: >60 mL/min (ref >60)
Glucose, Bld: 99 mg/dL (ref 70–99)
Potassium: 3.5 mmol/L (ref 3.5–5.1)
Sodium: 139 mmol/L (ref 135–145)
Total Bilirubin: 0.7 mg/dL (ref 0.3–1.2)
Total Protein: 7.1 g/dL (ref 6.5–8.1)

## 2022-04-15 LAB — CBC WITH DIFFERENTIAL/PLATELET
Abs Immature Granulocytes: 0.01 10*3/uL (ref 0.00–0.07)
Basophils Absolute: 0 10*3/uL (ref 0.0–0.1)
Basophils Relative: 1 %
Eosinophils Absolute: 0.1 10*3/uL (ref 0.0–0.5)
Eosinophils Relative: 1 %
HCT: 35.3 % — ABNORMAL LOW (ref 36.0–46.0)
Hemoglobin: 11.8 g/dL — ABNORMAL LOW (ref 12.0–15.0)
Immature Granulocytes: 0 %
Lymphocytes Relative: 46 %
Lymphs Abs: 2.7 10*3/uL (ref 0.7–4.0)
MCH: 33.4 pg (ref 26.0–34.0)
MCHC: 33.4 g/dL (ref 30.0–36.0)
MCV: 100 fL (ref 80.0–100.0)
Monocytes Absolute: 0.3 10*3/uL (ref 0.1–1.0)
Monocytes Relative: 5 %
Neutro Abs: 2.8 10*3/uL (ref 1.7–7.7)
Neutrophils Relative %: 47 %
Platelets: 271 10*3/uL (ref 150–400)
RBC: 3.53 MIL/uL — ABNORMAL LOW (ref 3.87–5.11)
RDW: 11.9 % (ref 11.5–15.5)
WBC: 5.9 10*3/uL (ref 4.0–10.5)
nRBC: 0 % (ref 0.0–0.2)

## 2022-04-15 LAB — I-STAT BETA HCG BLOOD, ED (MC, WL, AP ONLY): I-stat hCG, quantitative: <5 m[IU]/mL (ref <5)

## 2022-04-15 LAB — POCT URINE PREGNANCY: Preg Test, Ur: NEGATIVE

## 2022-04-15 MED ORDER — LIDOCAINE VISCOUS HCL 2 % MT SOLN: 10.0000 mL | Freq: Four times a day (QID) | OROMUCOSAL | 0 refills | Status: DC | PRN

## 2022-04-15 MED ORDER — NAPROXEN 500 MG PO TABS: 500.0000 mg | ORAL_TABLET | Freq: Two times a day (BID) | ORAL | 0 refills | Status: DC

## 2022-04-15 NOTE — ED Notes (Signed)
Pt no longer wanted to wait  

## 2022-04-15 NOTE — ED Triage Notes (Signed)
Patient presents to Urgent Care with complaints of right sided bottom mouth pain since last week . Patient reports she st a tooth broke and she has been hurting real bad and has an appointment in august to get the tooth pulled. Pt reports she cannot eat/ sleep due to pain  Pt reports in 2019 she had a partial hysterectomy and pmh of ovian cyst. Pt reports new onset of lower abdominal pain concerned for either cyst or sti.  Pt reports no odor, discharge, dysuria, but recently found out so was having intercourse with another person.

## 2022-04-15 NOTE — ED Notes (Signed)
Patient is being discharged from the Urgent Care and sent to the Emergency Department via personal vehicle . Per Dr Chaney Malling, patient is in need of higher level of care due to needing a pelvic ultrasound. Patient is aware and verbalizes understanding of plan of care.  Vitals:   04/15/22 1101  BP: 116/78  Pulse: (!) 52  Resp: 18  Temp: 97.8 F (36.6 C)  SpO2: 98%

## 2022-04-15 NOTE — Discharge Instructions (Addendum)
Please go immediately to the  [facility] or [facility] emergency department.  Your pelvic pain could be caused by number of things, could be intermittent ovarian torsion, a stuck kidney stone, diverticulitis.  Unfortunately, we do not have labs or imaging to evaluate for these entities.  Please let them know immediately if the pain returns.  Start taking the antibiotics prescribed to you by your dentist.  Take the Aleve with 1000 mg of Tylenol together twice a day.  You can take an additional 1000 mg of Tylenol 2 more times a day for pain.  Do not exceed more than 4000 mg of Tylenol from all sources in 1 day.   Viscous lidocaine will numb your tooth. Please follow-up with your dentist ASAP.

## 2022-04-15 NOTE — ED Provider Triage Note (Signed)
Emergency Medicine Provider Triage Evaluation Note  Jennifer Combs , a 36 y.o. female  was evaluated in triage.  Pt complains of left-sided abdominal pain.  Patient sent by urgent care for imaging due to complaint.  Patient has had 1 to 2 weeks of intermittent sharp, stabbing left lower quadrant pain that worsened today.  Pain radiates across the abdomen mostly on the left side.  Patient states the pain is severe at this time.  She denies any urinary complaints or back pain.  Denies any vaginal symptoms.  No history of PCOS, diverticulitis, abdominal surgeries other than partial hysterectomy, ovarian torsion, TOA  Review of Systems  Positive: Abdominal pain Negative: Nausea, vomiting  Physical Exam  BP 112/67 (BP Location: Right Arm)   Pulse (!) 54   Temp 98.7 F (37.1 C) (Oral)   Resp 18   LMP 12/13/2017 (Exact Date)   SpO2 100%  Gen:   Awake, no distress   Resp:  Normal effort  MSK:   Moves extremities without difficulty  Other:    Medical Decision Making  Medically screening exam initiated at 1:49 PM.  Appropriate orders placed.  Jennifer Combs was informed that the remainder of the evaluation will be completed by another provider, this initial triage assessment does not replace that evaluation, and the importance of remaining in the ED until their evaluation is complete.     Darrick Grinder, PA-C 04/15/22 1350

## 2022-04-15 NOTE — ED Provider Notes (Signed)
HPI  SUBJECTIVE:  Jennifer Combs is a 36 y.o. female who presents with 2 issues: First, she reports sharp, throbbing, constant right upper dental pain coming from a broken wisdom tooth starting several weeks ago.  She states it radiates into her ear.  She states that she cannot eat secondary to the pain.  She reports sensitivity to temperature and facial swelling.  She called her dentist today, and has antibiotics called in for her already.  No gingival swelling, purulent discharge, trismus, fevers.  She tried some leftover oxycodone without relief.  She has not tried anything else for her pain.  There are no alleviating factors.  Symptoms worse with exposure to cold temperatures.  Second, she reports 1 to 2 weeks of remittent, sharp, stabbing left lower quadrant pain that got worse today.  States it is now radiating across her lower abdomen.  She states it is acute onset, excruciating, maximal at onset and spontaneously resolves after a few minutes.  No urinary complaints, back pain.  No vaginal odor or bleeding, discharge, rash, itching.  No fevers, abdominal distention.  She had a normal bowel movement last night, but this did not change her pain.  She just discovered that her female sexual partner has other sexual partners.  She is not sure how many he has.  She is concerned about STDs.  He is asymptomatic to his knowledge.  She denies having any other sexual partners.  She is status post bilateral tubal ligation and hysterectomy.  She has a past medical history of left ovarian cyst, IBS, gonorrhea, chlamydia, trichomonas.  No history of PCOS, diverticulitis, other abdominal surgeries, ovarian torsion, TOA.    Past Medical History:  Diagnosis Date   Allergy    Anemia    Anxiety    Depression    Ectopic pregnancy, tubal    GERD (gastroesophageal reflux disease)    H. pylori infection    IBS (irritable bowel syndrome)    Internal hemorrhoids    Ovarian cyst bil   PONV (postoperative nausea  and vomiting)    Trichomonas infection     Past Surgical History:  Procedure Laterality Date   CYSTOSCOPY N/A 12/26/2017   Procedure: CYSTOSCOPY;  Surgeon: Jerene Bears, MD;  Location: [facility];  Service: Gynecology;  Laterality: N/A;   TOTAL LAPAROSCOPIC HYSTERECTOMY WITH SALPINGECTOMY Bilateral 12/26/2017   Procedure: TOTAL LAPAROSCOPIC HYSTERECTOMY WITH SALPINGECTOMY;  Surgeon: Jerene Bears, MD;  Location: [facility];  Service: Gynecology;  Laterality: Bilateral;   TUBAL LIGATION      Family History  Problem Relation Age of Onset   Endometriosis Mother    Anemia Maternal Grandmother    Clotting disorder Maternal Grandmother    Thyroid disease Maternal Grandmother    Migraines Maternal Grandmother        "something wrong with her eyes"   Migraines Other    Schizophrenia Other     Social History   Tobacco Use   Smoking status: Former    Packs/day: 0.10    Types: Cigarettes   Smokeless tobacco: Never   Tobacco comments:    patient down to 2 cigarettes per day  Vaping Use   Vaping Use: Never used  Substance Use Topics   Alcohol use: Yes    Comment: very rare   Drug use: Yes    Frequency: 28.0 times per week    Types: Marijuana    Comment: uses for pain relief, 4 per day    No current  facility-administered medications for this encounter.  Current Outpatient Medications:    lidocaine (XYLOCAINE) 2 % solution, Use as directed 10 mLs in the mouth or throat every 6 (six) hours as needed for mouth pain. Hold in mouth and spit. Do not swallow., Disp: 100 mL, Rfl: 0   naproxen (NAPROSYN) 500 MG tablet, Take 1 tablet (500 mg total) by mouth 2 (two) times daily., Disp: 20 tablet, Rfl: 0   acetaminophen (TYLENOL) 500 MG tablet, Take 500 mg by mouth every 6 (six) hours as needed (knee pain)., Disp: , Rfl:    dicyclomine (BENTYL) 10 MG capsule, Take 1 capsule 30 minutes ac 2 meals of the day and at bedtime, Disp: 90 capsule, Rfl: 0    DULoxetine (CYMBALTA) 20 MG capsule, Take 1 capsule (20 mg total) by mouth daily., Disp: 30 capsule, Rfl: 3   omeprazole (PRILOSEC) 20 MG capsule, Take 1 capsule (20 mg total) by mouth 2 (two) times daily before a meal., Disp: 60 capsule, Rfl: 3  Allergies  Allergen Reactions   Adhesive [Tape] Other (See Comments)    Adhesive from electrodes Leave burn marks on skin   Citrus    Tomato     MAKES MOUTH RAW.     ROS  As noted in HPI.   Physical Exam  BP 116/78   Pulse (!) 52   Temp 97.8 F (36.6 C)   Resp 18   LMP 12/13/2017 (Exact Date)   SpO2 98%   Constitutional: Well developed, well nourished, no acute distress Eyes:  EOMI, conjunctiva normal bilaterally HENT: Normocephalic, atraumatic,mucus membranes moist.  Tender broken right upper wisdom tooth.  No gingival tenderness, swelling.  No trismus.  No appreciable facial swelling. Neck: No appreciable cervical lymphadenopathy Respiratory: Normal inspiratory effort Cardiovascular: Normal rate GI: nondistended soft, positive left lower quadrant tenderness.  Active bowel sounds.  No rebound, guarding.  Positive tap table test. Back: No CVAT skin: No rash, skin intact Musculoskeletal: no deformities Neurologic: Alert & oriented x 3, no focal neuro deficits Psychiatric: Speech and behavior appropriate   ED Course   Medications - No data to display  Orders Placed This Encounter  Procedures   POCT urine pregnancy    Standing Status:   Standing    Number of Occurrences:   1   POCT urinalysis dipstick    Standing Status:   Standing    Number of Occurrences:   1    Results for orders placed or performed during the hospital encounter of 04/15/22 (from the past 24 hour(s))  POCT urinalysis dipstick     Status: None   Collection Time: 04/15/22 11:16 AM  Result Value Ref Range   Color, UA yellow yellow   Clarity, UA clear clear   Glucose, UA negative negative mg/dL   Bilirubin, UA negative negative   Ketones, POC UA  negative negative mg/dL   Spec Grav, UA 1.020 1.010 - 1.025   Blood, UA negative negative   pH, UA 6.0 5.0 - 8.0   Protein Ur, POC negative negative mg/dL   Urobilinogen, UA 0.2 0.2 or 1.0 E.U./dL   Nitrite, UA Negative Negative   Leukocytes, UA Negative Negative  POCT urine pregnancy     Status: None   Collection Time: 04/15/22 11:18 AM  Result Value Ref Range   Preg Test, Ur Negative Negative   No results found.  ED Clinical Impression  1. Pelvic pain in female   2. Pain, dental   3. Screening for STDs (sexually transmitted  diseases)      ED Assessment/Plan  1.  Dental pain.  Patient with a broken tooth, possible early infection.  She already has antibiotics called in by her dentist.  Will send home with viscous lidocaine, Aleve and Tylenol.  She will need to follow-up with her dentist ASAP.  2.  Left pelvic pain/left lower quadrant pain.  History is concerning for intermittent ovarian torsion.  Could be an ovarian cyst, diverticulitis.  She is not pregnant, no evidence of UTI.  Could be a lodged kidney stone.  STD, BV, yeast testing sent.  Unfortunately, we do not have labs or imaging here to evaluate for these entities.  Transferring to the emergency room for further work-up.  She is stable to go by private vehicle.   Discussed labs, MDM, treatment plan, and rationale for transfer to the emergency department.  She agrees with plan..   Meds ordered this encounter  Medications   naproxen (NAPROSYN) 500 MG tablet    Sig: Take 1 tablet (500 mg total) by mouth 2 (two) times daily.    Dispense:  20 tablet    Refill:  0   lidocaine (XYLOCAINE) 2 % solution    Sig: Use as directed 10 mLs in the mouth or throat every 6 (six) hours as needed for mouth pain. Hold in mouth and spit. Do not swallow.    Dispense:  100 mL    Refill:  0      *This clinic note was created using Scientist, clinical (histocompatibility and immunogenetics). Therefore, there may be occasional mistakes despite careful  proofreading.  ?    Domenick Gong, MD 04/15/22 1152

## 2022-04-15 NOTE — ED Triage Notes (Signed)
Patient complains of LLQ abdominal pain x2 weeks, history of ovarian cysts and partial hysterectomy. Patient is alert, oriented, ambulatory, and in no apparent distress at this time.

## 2022-04-16 ENCOUNTER — Telehealth (HOSPITAL_COMMUNITY): Payer: Self-pay | Admitting: Emergency Medicine

## 2022-04-16 LAB — CERVICOVAGINAL ANCILLARY ONLY
Bacterial Vaginitis (gardnerella): POSITIVE — AB
Candida Glabrata: NEGATIVE
Candida Vaginitis: NEGATIVE
Chlamydia: NEGATIVE
Comment: NEGATIVE
Comment: NEGATIVE
Comment: NEGATIVE
Comment: NEGATIVE
Comment: NEGATIVE
Comment: NORMAL
Neisseria Gonorrhea: NEGATIVE
Trichomonas: NEGATIVE

## 2022-04-16 MED ORDER — METRONIDAZOLE 0.75 % VA GEL
1.0000 | Freq: Every day | VAGINAL | 0 refills | Status: AC
Start: 2022-04-16 — End: 2022-04-21

## 2022-04-17 DIAGNOSIS — Z419 Encounter for procedure for purposes other than remedying health state, unspecified: Secondary | ICD-10-CM | POA: Diagnosis not present

## 2022-05-18 DIAGNOSIS — Z419 Encounter for procedure for purposes other than remedying health state, unspecified: Secondary | ICD-10-CM | POA: Diagnosis not present

## 2022-06-11 ENCOUNTER — Encounter (HOSPITAL_BASED_OUTPATIENT_CLINIC_OR_DEPARTMENT_OTHER): Payer: Self-pay

## 2022-06-11 ENCOUNTER — Emergency Department (HOSPITAL_BASED_OUTPATIENT_CLINIC_OR_DEPARTMENT_OTHER)
Admission: EM | Admit: 2022-06-11 | Discharge: 2022-06-11 | Discharge status: Against Medical Advice | Payer: Medicaid Other | Attending: Emergency Medicine | Admitting: Emergency Medicine

## 2022-06-11 ENCOUNTER — Other Ambulatory Visit: Payer: Self-pay

## 2022-06-11 DIAGNOSIS — Z743 Need for continuous supervision: Secondary | ICD-10-CM | POA: Diagnosis not present

## 2022-06-11 DIAGNOSIS — H9202 Otalgia, left ear: Secondary | ICD-10-CM | POA: Diagnosis not present

## 2022-06-11 DIAGNOSIS — Z5321 Procedure and treatment not carried out due to patient leaving prior to being seen by health care provider: Secondary | ICD-10-CM | POA: Insufficient documentation

## 2022-06-11 DIAGNOSIS — R609 Edema, unspecified: Secondary | ICD-10-CM | POA: Diagnosis not present

## 2022-06-11 NOTE — ED Triage Notes (Signed)
Patient BIB [facility] from Home.   Endorses Left Otalgia that began Monday and has worsened since. Drainage Today.  No Known Fevers.   VSS with [facility]. A&Ox4. GCS 15. Ambulatory. NAD Noted during Triage.

## 2022-06-12 ENCOUNTER — Ambulatory Visit: Payer: Medicaid Other

## 2022-06-14 ENCOUNTER — Ambulatory Visit: Admit: 2022-06-14 | Discharge status: Absent without leave | Payer: Medicaid Other

## 2022-06-18 DIAGNOSIS — Z419 Encounter for procedure for purposes other than remedying health state, unspecified: Secondary | ICD-10-CM | POA: Diagnosis not present

## 2022-07-18 DIAGNOSIS — Z419 Encounter for procedure for purposes other than remedying health state, unspecified: Secondary | ICD-10-CM | POA: Diagnosis not present

## 2022-08-17 ENCOUNTER — Ambulatory Visit
Admission: RE | Admit: 2022-08-17 | Discharge: 2022-08-17 | Disposition: A | Discharge status: Home / Self Care | Payer: Medicaid Other | Source: Ambulatory Visit | Attending: Internal Medicine | Admitting: Internal Medicine

## 2022-08-17 VITALS — BP 112/70 | HR 73 | Temp 98.0°F | Resp 18

## 2022-08-17 DIAGNOSIS — J029 Acute pharyngitis, unspecified: Secondary | ICD-10-CM | POA: Diagnosis not present

## 2022-08-17 DIAGNOSIS — Z113 Encounter for screening for infections with a predominantly sexual mode of transmission: Secondary | ICD-10-CM | POA: Diagnosis not present

## 2022-08-17 LAB — POCT RAPID STREP A (OFFICE): Rapid Strep A Screen: NEGATIVE

## 2022-08-17 NOTE — ED Provider Notes (Signed)
[facility]    CSN: [id number] Arrival date & time: 08/17/22  1111      History   Chief Complaint Chief Complaint  Patient presents with   SEXUALLY TRANSMITTED DISEASE    Just doing a check up I recently changed partners - Entered by patient    HPI Jennifer Combs is a 36 y.o. female.   Patient presents with 2 different chief complaints today.  Patient reports that she would like routine STD testing.  She states that she has a new sexual partner but no confirmed exposure to STD.  She also denies any associated symptoms including vaginal discharge, dysuria, urinary frequency, abnormal vaginal bleeding, abdominal pain, back pain, fever.  Patient denies that there is any chance of pregnancy given that she has had a complete hysterectomy.  Patient also reports that she has been having an intermittent sore throat for the past few days. Denies current sore throat. She would like to be tested for strep given that her boyfriend recently tested positive for strep throat on a throat culture.  She denies any other associated symptoms or fever.  Does not report taking medications to help alleviate symptoms.     Past Medical History:  Diagnosis Date   Allergy    Anemia    Anxiety    Depression    Ectopic pregnancy, tubal    GERD (gastroesophageal reflux disease)    H. pylori infection    IBS (irritable bowel syndrome)    Internal hemorrhoids    Ovarian cyst bil   PONV (postoperative nausea and vomiting)    Trichomonas infection     Patient Active Problem List   Diagnosis Date Noted   Other stressful life events affecting family and household 02/28/2019   Mood disorder (HCC) 02/28/2019   Menorrhagia 12/26/2017   IDA (iron deficiency anemia) 05/19/2017   Epigastric pain 04/26/2017   Macrocytic anemia 04/26/2017   Herpes genitalia 04/08/2011    Past Surgical History:  Procedure Laterality Date   CYSTOSCOPY N/A 12/26/2017   Procedure: CYSTOSCOPY;  Surgeon:  Jerene Bears, MD;  Location: [facility];  Service: Gynecology;  Laterality: N/A;   TOTAL LAPAROSCOPIC HYSTERECTOMY WITH SALPINGECTOMY Bilateral 12/26/2017   Procedure: TOTAL LAPAROSCOPIC HYSTERECTOMY WITH SALPINGECTOMY;  Surgeon: Jerene Bears, MD;  Location: [facility];  Service: Gynecology;  Laterality: Bilateral;   TUBAL LIGATION      OB History     Gravida  6   Para  4   Term  3   Preterm  1   AB  2   Living  4      SAB      IAB  1   Ectopic  1   Multiple      Live Births  4            Home Medications    Prior to Admission medications   Medication Sig Start Date End Date Taking? Authorizing Provider  acetaminophen (TYLENOL) 500 MG tablet Take 500 mg by mouth every 6 (six) hours as needed (knee pain).    [provider]  dicyclomine (BENTYL) 10 MG capsule Take 1 capsule 30 minutes ac 2 meals of the day and at bedtime 01/25/19   Lynann Bologna, MD  DULoxetine (CYMBALTA) 20 MG capsule Take 1 capsule (20 mg total) by mouth daily. 12/26/18   Jerene Bears, MD  lidocaine (XYLOCAINE) 2 % solution Use as directed 10 mLs in the mouth or throat every  6 (six) hours as needed for mouth pain. Hold in mouth and spit. Do not swallow. 04/15/22   Melynda Ripple, MD  naproxen (NAPROSYN) 500 MG tablet Take 1 tablet (500 mg total) by mouth 2 (two) times daily. 04/15/22   Melynda Ripple, MD  omeprazole (PRILOSEC) 20 MG capsule Take 1 capsule (20 mg total) by mouth 2 (two) times daily before a meal. 10/03/18   Pyrtle, Lajuan Lines, MD    Family History Family History  Problem Relation Age of Onset   Endometriosis Mother    Anemia Maternal Grandmother    Clotting disorder Maternal Grandmother    Thyroid disease Maternal Grandmother    Migraines Maternal Grandmother        "something wrong with her eyes"   Migraines Other    Schizophrenia Other     Social History Social History   Tobacco Use   Smoking status: Former    Packs/day:  0.10    Types: Cigarettes   Smokeless tobacco: Never   Tobacco comments:    patient down to 2 cigarettes per day  Vaping Use   Vaping Use: Never used  Substance Use Topics   Alcohol use: Yes    Comment: very rare   Drug use: Yes    Frequency: 28.0 times per week    Types: Marijuana    Comment: uses for pain relief, 4 per day     Allergies   Adhesive [tape], Citrus, and Tomato   Review of Systems Review of Systems Per HPI  Physical Exam Triage Vital Signs ED Triage Vitals  Enc Vitals Group     BP 08/17/22 1203 112/70     Pulse Rate 08/17/22 1201 73     Resp 08/17/22 1201 18     Temp 08/17/22 1201 98 F (36.7 C)     Temp src --      SpO2 08/17/22 1201 99 %     Weight --      Height --      Head Circumference --      Peak Flow --      Pain Score 08/17/22 1201 0     Pain Loc --      Pain Edu? --      Excl. in [place]? --    No data found.  Updated Vital Signs BP 112/70   Pulse 73   Temp 98 F (36.7 C)   Resp 18   LMP 12/13/2017 (Exact Date)   SpO2 99%   Visual Acuity Right Eye Distance:   Left Eye Distance:   Bilateral Distance:    Right Eye Near:   Left Eye Near:    Bilateral Near:     Physical Exam Constitutional:      General: She is not in acute distress.    Appearance: Normal appearance. She is not toxic-appearing or diaphoretic.  HENT:     Head: Normocephalic and atraumatic.     Mouth/Throat:     Lips: Pink.     Pharynx: No pharyngeal swelling or posterior oropharyngeal erythema.  Eyes:     Extraocular Movements: Extraocular movements intact.     Conjunctiva/sclera: Conjunctivae normal.  Pulmonary:     Effort: Pulmonary effort is normal.  Genitourinary:    Comments: Deferred with shared decision making.  Self swab performed. Neurological:     General: No focal deficit present.     Mental Status: She is alert and oriented to person, place, and time. Mental status is at baseline.  Psychiatric:  Mood and Affect: Mood normal.         Behavior: Behavior normal.        Thought Content: Thought content normal.        Judgment: Judgment normal.      UC Treatments / Results  Labs (all labs ordered are listed, but only abnormal results are displayed) Labs Reviewed  CULTURE, GROUP A STREP ([facility])  RPR  HIV ANTIBODY (ROUTINE TESTING W REFLEX)  POCT RAPID STREP A (OFFICE)  CERVICOVAGINAL ANCILLARY ONLY    EKG   Radiology No results found.  Procedures Procedures (including critical care time)  Medications Ordered in UC Medications - No data to display  Initial Impression / Assessment and Plan / UC Course  I have reviewed the triage vital signs and the nursing notes.  Pertinent labs & imaging results that were available during my care of the patient were reviewed by me and considered in my medical decision making (see chart for details).     Cervicovaginal swab, HIV, RPR test pending per patient request for routine STD testing.  Patient denied that she had any obvious exposures so will await test results for any treatment.  Patient advised to refrain from sexual activity until test results and treatment are complete and voiced understanding.  Rapid strep was negative.  Throat culture is pending.  Low concern for strep throat at this time.  No concern for peritonsillar abscess on exam.  Advised patient to follow-up if symptoms persist or worsen.  Patient verbalized understanding and was agreeable with plan. Final Clinical Impressions(s) / UC Diagnoses   Final diagnoses:  Screening examination for venereal disease  Sore throat     Discharge Instructions      Rapid strep test was negative.  Throat culture and STD tests are pending.  We will call if anything is positive.  We ask that you refrain from sexual activity until test results and treatment are complete.    ED Prescriptions   None    PDMP not reviewed this encounter.   Gustavus Bryant, [place] 08/17/22 [phone]

## 2022-08-17 NOTE — ED Triage Notes (Addendum)
Pt is present today with concerns for STD. Pt denies any sx patient states that she has a new partner and would like a routine test and blood work.

## 2022-08-17 NOTE — Discharge Instructions (Signed)
Rapid strep test was negative.  Throat culture and STD tests are pending.  We will call if anything is positive.  We ask that you refrain from sexual activity until test results and treatment are complete.

## 2022-08-18 ENCOUNTER — Telehealth (HOSPITAL_COMMUNITY): Payer: Self-pay | Admitting: Emergency Medicine

## 2022-08-18 DIAGNOSIS — Z419 Encounter for procedure for purposes other than remedying health state, unspecified: Secondary | ICD-10-CM | POA: Diagnosis not present

## 2022-08-18 LAB — CERVICOVAGINAL ANCILLARY ONLY
Bacterial Vaginitis (gardnerella): POSITIVE — AB
Candida Glabrata: POSITIVE — AB
Candida Vaginitis: NEGATIVE
Chlamydia: NEGATIVE
Comment: NEGATIVE
Comment: NEGATIVE
Comment: NEGATIVE
Comment: NEGATIVE
Comment: NEGATIVE
Comment: NORMAL
Neisseria Gonorrhea: NEGATIVE
Trichomonas: NEGATIVE

## 2022-08-18 LAB — HIV ANTIBODY (ROUTINE TESTING W REFLEX): HIV Screen 4th Generation wRfx: NONREACTIVE

## 2022-08-18 LAB — RPR: RPR Ser Ql: NONREACTIVE

## 2022-08-18 MED ORDER — METRONIDAZOLE 0.75 % VA GEL: 1.0000 | Freq: Every day | VAGINAL | 0 refills | Status: AC

## 2022-08-18 MED ORDER — FLUCONAZOLE 150 MG PO TABS: 150.0000 mg | ORAL_TABLET | Freq: Once | ORAL | 0 refills | Status: AC

## 2022-08-20 LAB — CULTURE, GROUP A STREP (THRC)

## 2022-09-06 ENCOUNTER — Encounter: Payer: Self-pay | Admitting: Family Medicine

## 2022-09-06 ENCOUNTER — Ambulatory Visit (INDEPENDENT_AMBULATORY_CARE_PROVIDER_SITE_OTHER): Payer: Medicaid Other | Admitting: Family Medicine

## 2022-09-06 ENCOUNTER — Encounter: Payer: Self-pay | Admitting: Family

## 2022-09-06 VITALS — BP 116/79 | HR 49 | Temp 97.9°F | Resp 16 | Ht 59.0 in | Wt 161.6 lb

## 2022-09-06 DIAGNOSIS — Z7689 Persons encountering health services in other specified circumstances: Secondary | ICD-10-CM | POA: Diagnosis not present

## 2022-09-06 DIAGNOSIS — D509 Iron deficiency anemia, unspecified: Secondary | ICD-10-CM | POA: Diagnosis not present

## 2022-09-06 DIAGNOSIS — K581 Irritable bowel syndrome with constipation: Secondary | ICD-10-CM

## 2022-09-06 DIAGNOSIS — Z8742 Personal history of other diseases of the female genital tract: Secondary | ICD-10-CM | POA: Diagnosis not present

## 2022-09-07 ENCOUNTER — Telehealth: Payer: Self-pay | Admitting: Hematology and Oncology

## 2022-09-07 ENCOUNTER — Encounter: Payer: Self-pay | Admitting: Family Medicine

## 2022-09-07 NOTE — Telephone Encounter (Signed)
Scheduled appointment per 11/21 referral. Patient is aware of appointment date and time. Patient is aware to arrive 15 mins prior to appointment time and to bring updated insurance cards. Patient is aware of location.   

## 2022-09-07 NOTE — Progress Notes (Signed)
New Patient Office Visit  Subjective    Patient ID: Jennifer Combs, female    DOB: 04/08/1986  Age: 36 y.o. MRN: [id number]  CC:  Chief Complaint  Patient presents with   Establish Care    HPI Jennifer Combs presents to establish care and for review of chronic med issues. Patient denies acute complaints or concerns. Patient reports that she needs referrals to specialists that she has not accessed for a period of time.    Outpatient Encounter Medications as of 09/06/2022  Medication Sig   [DISCONTINUED] acetaminophen (TYLENOL) 500 MG tablet Take 500 mg by mouth every 6 (six) hours as needed (knee pain).   [DISCONTINUED] dicyclomine (BENTYL) 10 MG capsule Take 1 capsule 30 minutes ac 2 meals of the day and at bedtime   [DISCONTINUED] DULoxetine (CYMBALTA) 20 MG capsule Take 1 capsule (20 mg total) by mouth daily.   [DISCONTINUED] lidocaine (XYLOCAINE) 2 % solution Use as directed 10 mLs in the mouth or throat every 6 (six) hours as needed for mouth pain. Hold in mouth and spit. Do not swallow.   [DISCONTINUED] naproxen (NAPROSYN) 500 MG tablet Take 1 tablet (500 mg total) by mouth 2 (two) times daily.   [DISCONTINUED] omeprazole (PRILOSEC) 20 MG capsule Take 1 capsule (20 mg total) by mouth 2 (two) times daily before a meal.   No facility-administered encounter medications on file as of 09/06/2022.    Past Medical History:  Diagnosis Date   Allergy    Anemia    Anxiety    Depression    Ectopic pregnancy, tubal    GERD (gastroesophageal reflux disease)    H. pylori infection    IBS (irritable bowel syndrome)    Internal hemorrhoids    Ovarian cyst bil   PONV (postoperative nausea and vomiting)    Trichomonas infection     Past Surgical History:  Procedure Laterality Date   CYSTOSCOPY N/A 12/26/2017   Procedure: CYSTOSCOPY;  Surgeon: Megan Salon, MD;  Location: [facility];  Service: Gynecology;  Laterality: N/A;   TOTAL LAPAROSCOPIC HYSTERECTOMY  WITH SALPINGECTOMY Bilateral 12/26/2017   Procedure: TOTAL LAPAROSCOPIC HYSTERECTOMY WITH SALPINGECTOMY;  Surgeon: Megan Salon, MD;  Location: [facility];  Service: Gynecology;  Laterality: Bilateral;   TUBAL LIGATION      Family History  Problem Relation Age of Onset   Endometriosis Mother    Anemia Maternal Grandmother    Clotting disorder Maternal Grandmother    Thyroid disease Maternal Grandmother    Migraines Maternal Grandmother        "something wrong with her eyes"   Migraines Other    Schizophrenia Other     Social History   Socioeconomic History   Marital status: Single    Spouse name: Not on file   Number of children: 4   Years of education: Not on file   Highest education level: 11th grade  Occupational History   Not on file  Tobacco Use   Smoking status: Former    Packs/day: 0.10    Types: Cigarettes   Smokeless tobacco: Never   Tobacco comments:    patient down to 2 cigarettes per day  Vaping Use   Vaping Use: Never used  Substance and Sexual Activity   Alcohol use: Yes    Comment: very rare   Drug use: Yes    Frequency: 28.0 times per week    Types: Marijuana    Comment: uses for pain relief, 4 per  day   Sexual activity: Not Currently    Birth control/protection: Surgical    Comment: hysterectomy  Other Topics Concern   Not on file  Social History Narrative   Lives at home with her children   Left handed   Caffeine: about 12 oz coke daily   Social Determinants of Health   Financial Resource Strain: Not on file  Food Insecurity: Not on file  Transportation Needs: Not on file  Physical Activity: Not on file  Stress: Not on file  Social Connections: Not on file  Intimate Partner Violence: Not on file    Review of Systems  All other systems reviewed and are negative.       Objective    BP 116/79   Pulse (!) 49   Temp 97.9 F (36.6 C) (Oral)   Resp 16   Ht 4\' 11"  (1.499 m)   Wt 161 lb 9.6 oz (73.3 kg)   LMP  12/13/2017 (Exact Date)   SpO2 98%   BMI 32.64 kg/m   Physical Exam Vitals and nursing note reviewed.  Constitutional:      General: She is not in acute distress. Cardiovascular:     Rate and Rhythm: Normal rate and regular rhythm.  Pulmonary:     Effort: Pulmonary effort is normal.     Breath sounds: Normal breath sounds.  Abdominal:     Palpations: Abdomen is soft.     Tenderness: There is no abdominal tenderness.  Neurological:     General: No focal deficit present.     Mental Status: She is alert and oriented to person, place, and time.         Assessment & Plan:   1. History of ovarian cyst Referral to gyn for further eval/mgt - Ambulatory referral to Gynecology  2. History of endometriosis Referral to gyn for further eval/mgt - Ambulatory referral to Gynecology  3. Irritable bowel syndrome with constipation Referral to GI for further eval/mgt - Ambulatory referral to Gastroenterology  4. Iron deficiency anemia, unspecified iron deficiency anemia type Referral to heme/onc for further eval/mgt - Ambulatory referral to Hematology / Oncology  5. Encounter to establish care     Return in about 6 months (around 03/07/2023) for follow up, physical.   03/09/2023, MD

## 2022-09-17 DIAGNOSIS — Z419 Encounter for procedure for purposes other than remedying health state, unspecified: Secondary | ICD-10-CM | POA: Diagnosis not present

## 2022-09-22 ENCOUNTER — Inpatient Hospital Stay: Payer: Medicaid Other | Attending: Hematology and Oncology | Admitting: Hematology and Oncology

## 2022-09-22 ENCOUNTER — Encounter: Payer: Self-pay | Admitting: Hematology and Oncology

## 2022-09-22 ENCOUNTER — Inpatient Hospital Stay: Payer: Medicaid Other

## 2022-09-22 ENCOUNTER — Other Ambulatory Visit: Payer: Self-pay

## 2022-09-22 VITALS — BP 107/62 | HR 62 | Temp 97.9°F | Resp 16 | Ht 59.0 in | Wt 162.1 lb

## 2022-09-22 DIAGNOSIS — Z87891 Personal history of nicotine dependence: Secondary | ICD-10-CM | POA: Insufficient documentation

## 2022-09-22 DIAGNOSIS — D509 Iron deficiency anemia, unspecified: Secondary | ICD-10-CM | POA: Diagnosis not present

## 2022-09-22 LAB — CBC WITH DIFFERENTIAL/PLATELET
Abs Immature Granulocytes: 0.01 10*3/uL (ref 0.00–0.07)
Basophils Absolute: 0 10*3/uL (ref 0.0–0.1)
Basophils Relative: 0 %
Eosinophils Absolute: 0.1 10*3/uL (ref 0.0–0.5)
Eosinophils Relative: 2 %
HCT: 34.3 % — ABNORMAL LOW (ref 36.0–46.0)
Hemoglobin: 11.3 g/dL — ABNORMAL LOW (ref 12.0–15.0)
Immature Granulocytes: 0 %
Lymphocytes Relative: 50 %
Lymphs Abs: 2.3 10*3/uL (ref 0.7–4.0)
MCH: 33.7 pg (ref 26.0–34.0)
MCHC: 32.9 g/dL (ref 30.0–36.0)
MCV: 102.4 fL — ABNORMAL HIGH (ref 80.0–100.0)
Monocytes Absolute: 0.2 10*3/uL (ref 0.1–1.0)
Monocytes Relative: 5 %
Neutro Abs: 2 10*3/uL (ref 1.7–7.7)
Neutrophils Relative %: 43 %
Platelets: 263 10*3/uL (ref 150–400)
RBC: 3.35 MIL/uL — ABNORMAL LOW (ref 3.87–5.11)
RDW: 11.9 % (ref 11.5–15.5)
WBC: 4.6 10*3/uL (ref 4.0–10.5)
nRBC: 0 % (ref 0.0–0.2)

## 2022-09-22 LAB — IRON AND IRON BINDING CAPACITY (CC-WL,HP ONLY)
Iron: 32 ug/dL (ref 28–170)
Saturation Ratios: 10 % — ABNORMAL LOW (ref 10.4–31.8)
TIBC: 332 ug/dL (ref 250–450)
UIBC: 300 ug/dL (ref 148–442)

## 2022-09-22 LAB — FERRITIN: Ferritin: 44 ng/mL (ref 11–307)

## 2022-09-22 NOTE — Progress Notes (Signed)
[facility] CONSULT NOTE  Patient Care Team: Georganna Skeans, MD as PCP - General (Family Medicine)  CHIEF COMPLAINTS/PURPOSE OF CONSULTATION:  IDA  ASSESSMENT & PLAN:   This is a very pleasant 36 year old female patient with no significant past medical history except for iron deficiency anemia many years ago required IV iron infusion lost to follow-up now reestablishing for treatment of anemia.  She tells me that she feels extremely fatigued, sleepy and short of breath hence her primary care doctor is making referrals to appropriate teams.  Her last labs were done in an urgent care or an ER setting back in June 2023 which showed mild anemia.  She did not have any recent labs.  She had a hysterectomy hence does not have any menstrual cycles.  She denies any hematochezia or melena.   Physical examination, no concerns.  No lymphadenopathy or hepatosplenomegaly.  I have reviewed her last labs available.  We do not have any recent labs. Labs from today showed a hemoglobin of 11.3 g, mild macrocytosis of 102.4, iron panel with no overt evidence of iron deficiency, ferritin pending At this time given the very mild degree of anemia, I do not believe her current symptoms are related to anemia.  We will however follow-up on the ferritin results and recommend iron replacement if needed.  She may have to go back to her PCP and get further evaluation to explain her constitutional symptoms.  She will return to clinic in a couple weeks for telephone visit to review results and to discuss any additional recommendations.  Thank you for consulting [place] in the care of this patient.  Please do not hesitate to contact us with any additional questions or concerns.  HISTORY OF PRESENTING ILLNESS:   Jennifer Combs 36 y.o. female is here because of IDA  This is a very pleasant 35 year old female patient who was previously seen by Dr. Myna Hidalgo and his team in 2018, since then was lost to follow-up since she  had some ongoing family issues referred to hematology for evaluation of iron deficiency anemia. Jennifer Combs is here for an initial visit by herself.  She tells me that she was getting iron infusions many years ago for iron deficiency but then had to take care of her son who was diagnosed with type 1 diabetes and stopped following up with hematology.  She cannot tolerate oral iron, she says she has underlying IBS. When asked how she feels, she tells me that she feels extremely tired, always sleepy, always achy, shortness of breath on exertion.  She denied any pica.  She denied any bleeding.  She had hysterectomy.  No change in bowel habits or urinary habits.  No new neurological complaints.  Rest of the pertinent 10 point ROS reviewed and negative  REVIEW OF SYSTEMS:   Constitutional: Denies fevers, chills or abnormal night sweats Eyes: Denies blurriness of vision, double vision or watery eyes Ears, nose, mouth, throat, and face: Denies mucositis or sore throat Respiratory: Denies cough, dyspnea or wheezes Cardiovascular: Denies palpitation, chest discomfort or lower extremity swelling Gastrointestinal:  Denies nausea, heartburn or change in bowel habits Skin: Denies abnormal skin rashes Lymphatics: Denies new lymphadenopathy or easy bruising Neurological:Denies numbness, tingling or new weaknesses Behavioral/Psych: Mood is stable, no new changes  All other systems were reviewed with the patient and are negative.  MEDICAL HISTORY:  Past Medical History:  Diagnosis Date   Allergy    Anemia    Anxiety    Depression  Ectopic pregnancy, tubal    GERD (gastroesophageal reflux disease)    H. pylori infection    IBS (irritable bowel syndrome)    Internal hemorrhoids    Ovarian cyst bil   PONV (postoperative nausea and vomiting)    Trichomonas infection     SURGICAL HISTORY: Past Surgical History:  Procedure Laterality Date   CYSTOSCOPY N/A 12/26/2017   Procedure: CYSTOSCOPY;  Surgeon:  Megan Salon, MD;  Location: [facility];  Service: Gynecology;  Laterality: N/A;   TOTAL LAPAROSCOPIC HYSTERECTOMY WITH SALPINGECTOMY Bilateral 12/26/2017   Procedure: TOTAL LAPAROSCOPIC HYSTERECTOMY WITH SALPINGECTOMY;  Surgeon: Megan Salon, MD;  Location: [facility];  Service: Gynecology;  Laterality: Bilateral;   TUBAL LIGATION      SOCIAL HISTORY: Social History   Socioeconomic History   Marital status: Single    Spouse name: Not on file   Number of children: 4   Years of education: Not on file   Highest education level: 11th grade  Occupational History   Not on file  Tobacco Use   Smoking status: Former    Packs/day: 0.10    Types: Cigarettes   Smokeless tobacco: Never   Tobacco comments:    patient down to 2 cigarettes per day  Vaping Use   Vaping Use: Never used  Substance and Sexual Activity   Alcohol use: Yes    Comment: very rare   Drug use: Yes    Frequency: 28.0 times per week    Types: Marijuana    Comment: uses for pain relief, 4 per day   Sexual activity: Not Currently    Birth control/protection: Surgical    Comment: hysterectomy  Other Topics Concern   Not on file  Social History Narrative   Lives at home with her children   Left handed   Caffeine: about 12 oz coke daily   Social Determinants of Health   Financial Resource Strain: Not on file  Food Insecurity: Not on file  Transportation Needs: Not on file  Physical Activity: Not on file  Stress: Not on file  Social Connections: Not on file  Intimate Partner Violence: Not on file    FAMILY HISTORY: Family History  Problem Relation Age of Onset   Endometriosis Mother    Anemia Maternal Grandmother    Clotting disorder Maternal Grandmother    Thyroid disease Maternal Grandmother    Migraines Maternal Grandmother        "something wrong with her eyes"   Migraines Other    Schizophrenia Other     ALLERGIES:  is allergic to adhesive [tape], citrus, and  tomato.  MEDICATIONS:  No current outpatient medications on file.   No current facility-administered medications for this visit.     PHYSICAL EXAMINATION: ECOG PERFORMANCE STATUS: 0 - Asymptomatic  Vitals:   09/22/22 1005  BP: 107/62  Pulse: 62  Resp: 16  Temp: 97.9 F (36.6 C)  SpO2: 100%   Filed Weights   09/22/22 1005  Weight: 162 lb 1.6 oz (73.5 kg)    GENERAL:alert, no distress and comfortable SKIN: skin color, texture, turgor are normal, no rashes or significant lesions EYES: normal, conjunctiva are pink and non-injected, sclera clear OROPHARYNX:no exudate, no erythema and lips, buccal mucosa, and tongue normal  NECK: supple, thyroid normal size, non-tender, without nodularity LYMPH:  no palpable lymphadenopathy in the cervical, axillary LUNGS: clear to auscultation and percussion with normal breathing effort HEART: regular rate & rhythm and no murmurs and no lower  extremity edema ABDOMEN:abdomen soft, non-tender and normal bowel sounds Musculoskeletal:no cyanosis of digits and no clubbing  PSYCH: alert & oriented x 3 with fluent speech NEURO: no focal motor/sensory deficits  LABORATORY DATA:  I have reviewed the data as listed Lab Results  Component Value Date   WBC 5.9 04/15/2022   HGB 11.8 (L) 04/15/2022   HCT 35.3 (L) 04/15/2022   MCV 100.0 04/15/2022   PLT 271 04/15/2022     Chemistry      Component Value Date/Time   NA 139 04/15/2022 1354   NA 137 02/28/2019 0911   NA 140 08/16/2017 1034   K 3.5 04/15/2022 1354   K 3.4 (L) 08/16/2017 1034   CL 107 04/15/2022 1354   CL 106 05/18/2017 1310   CO2 25 04/15/2022 1354   CO2 24 08/16/2017 1034   BUN 11 04/15/2022 1354   BUN 9 02/28/2019 0911   BUN 11.8 08/16/2017 1034   CREATININE 0.83 04/15/2022 1354   CREATININE 0.8 08/16/2017 1034      Component Value Date/Time   CALCIUM 9.1 04/15/2022 1354   CALCIUM 9.2 08/16/2017 1034   ALKPHOS 54 04/15/2022 1354   ALKPHOS 48 08/16/2017 1034   AST 21  04/15/2022 1354   AST 21 08/16/2017 1034   ALT 12 04/15/2022 1354   ALT 12 08/16/2017 1034   BILITOT 0.7 04/15/2022 1354   BILITOT 0.4 02/28/2019 0911   BILITOT 0.70 08/16/2017 1034       RADIOGRAPHIC STUDIES: I have personally reviewed the radiological images as listed and agreed with the findings in the report. No results found.  All questions were answered. The patient knows to call the clinic with any problems, questions or concerns. I spent 35 minutes in the care of this patient including H and P, review of records, counseling and coordination of care.     Benay Pike, MD 09/22/2022 10:10 AM

## 2022-10-18 DIAGNOSIS — Z419 Encounter for procedure for purposes other than remedying health state, unspecified: Secondary | ICD-10-CM | POA: Diagnosis not present

## 2022-10-19 ENCOUNTER — Inpatient Hospital Stay: Payer: Medicaid Other | Attending: Hematology and Oncology | Admitting: Hematology and Oncology

## 2022-10-19 DIAGNOSIS — D509 Iron deficiency anemia, unspecified: Secondary | ICD-10-CM

## 2022-10-19 NOTE — Progress Notes (Signed)
[place] CONSULT NOTE  Patient Care Team: Dorna Mai, MD as PCP - General (Family Medicine)  CHIEF COMPLAINTS/PURPOSE OF CONSULTATION:  IDA  ASSESSMENT & PLAN:   This is a very pleasant 37 year old female patient with no significant past medical history except for iron deficiency anemia many years ago required IV iron infusion lost to follow-up now reestablishing for treatment of anemia.  She is here for telephone visit.  She continues to complain of shortness of breath, sleepiness since her last visit.  She is extremely tired.  She is hoping that she can get some IV iron.  We reviewed labs which showed mild anemia, ferritin of 44.  I initially advised her to try some oral iron supplementation however she mentions that if she gets severely constipated given her history of IBS and she would like to receive IV iron.  She has tolerated Feraheme very well in the past.  I have ordered Feraheme again given her clinical symptoms and mild anemia. I have sent message to scheduling to have this scheduled. She will return to clinic in approximately 3 to 4 months with repeat labs  Thank you for consulting [place] in the care of this patient.  Please do not hesitate to contact us with any additional questions or concerns.  HISTORY OF PRESENTING ILLNESS:   Jennifer Combs 37 y.o. female is here because of IDA  This is a very pleasant 37 year old female patient who was previously seen by Dr. Marin Olp and his team in 2018, since then was lost to follow-up since she had some ongoing family issues referred to hematology for evaluation of iron deficiency anemia. Ms. Runyan is here for an initial visit by herself.  She tells me that she was getting iron infusions many years ago for iron deficiency but then had to take care of her son who was diagnosed with type 1 diabetes and stopped following up with hematology.  She cannot tolerate oral iron, she says she has underlying IBS.  She is here for  a phone visit today.  She continues to complain severe fatigue, exhaustion and sleepiness and was hoping to get some IV iron.  No other concerns expressed today.   MEDICAL HISTORY:  Past Medical History:  Diagnosis Date   Allergy    Anemia    Anxiety    Depression    Ectopic pregnancy, tubal    GERD (gastroesophageal reflux disease)    H. pylori infection    IBS (irritable bowel syndrome)    Internal hemorrhoids    Ovarian cyst bil   PONV (postoperative nausea and vomiting)    Trichomonas infection     SURGICAL HISTORY: Past Surgical History:  Procedure Laterality Date   CYSTOSCOPY N/A 12/26/2017   Procedure: CYSTOSCOPY;  Surgeon: Megan Salon, MD;  Location: [facility];  Service: Gynecology;  Laterality: N/A;   TOTAL LAPAROSCOPIC HYSTERECTOMY WITH SALPINGECTOMY Bilateral 12/26/2017   Procedure: TOTAL LAPAROSCOPIC HYSTERECTOMY WITH SALPINGECTOMY;  Surgeon: Megan Salon, MD;  Location: [facility];  Service: Gynecology;  Laterality: Bilateral;   TUBAL LIGATION      SOCIAL HISTORY: Social History   Socioeconomic History   Marital status: Single    Spouse name: Not on file   Number of children: 4   Years of education: Not on file   Highest education level: 11th grade  Occupational History   Not on file  Tobacco Use   Smoking status: Former    Packs/day: 0.10  Types: Cigarettes   Smokeless tobacco: Never   Tobacco comments:    patient down to 2 cigarettes per day  Vaping Use   Vaping Use: Never used  Substance and Sexual Activity   Alcohol use: Yes    Comment: very rare   Drug use: Yes    Frequency: 28.0 times per week    Types: Marijuana    Comment: uses for pain relief, 4 per day   Sexual activity: Not Currently    Birth control/protection: Surgical    Comment: hysterectomy  Other Topics Concern   Not on file  Social History Narrative   Lives at home with her children   Left handed   Caffeine: about 12 oz coke daily    Social Determinants of Health   Financial Resource Strain: Not on file  Food Insecurity: Not on file  Transportation Needs: Not on file  Physical Activity: Not on file  Stress: Not on file  Social Connections: Not on file  Intimate Partner Violence: Not on file    FAMILY HISTORY: Family History  Problem Relation Age of Onset   Endometriosis Mother    Anemia Maternal Grandmother    Clotting disorder Maternal Grandmother    Thyroid disease Maternal Grandmother    Migraines Maternal Grandmother        "something wrong with her eyes"   Migraines Other    Schizophrenia Other     ALLERGIES:  is allergic to adhesive [tape], citrus, and tomato.  MEDICATIONS:  No current outpatient medications on file.   No current facility-administered medications for this visit.   PHYSICAL EXAMINATION: ECOG PERFORMANCE STATUS: 0 - Asymptomatic  Telephone visit, PE deferred.   LABORATORY DATA:  I have reviewed the data as listed Lab Results  Component Value Date   WBC 4.6 09/22/2022   HGB 11.3 (L) 09/22/2022   HCT 34.3 (L) 09/22/2022   MCV 102.4 (H) 09/22/2022   PLT 263 09/22/2022     Chemistry      Component Value Date/Time   NA 139 04/15/2022 1354   NA 137 02/28/2019 0911   NA 140 08/16/2017 1034   K 3.5 04/15/2022 1354   K 3.4 (L) 08/16/2017 1034   CL 107 04/15/2022 1354   CL 106 05/18/2017 1310   CO2 25 04/15/2022 1354   CO2 24 08/16/2017 1034   BUN 11 04/15/2022 1354   BUN 9 02/28/2019 0911   BUN 11.8 08/16/2017 1034   CREATININE 0.83 04/15/2022 1354   CREATININE 0.8 08/16/2017 1034      Component Value Date/Time   CALCIUM 9.1 04/15/2022 1354   CALCIUM 9.2 08/16/2017 1034   ALKPHOS 54 04/15/2022 1354   ALKPHOS 48 08/16/2017 1034   AST 21 04/15/2022 1354   AST 21 08/16/2017 1034   ALT 12 04/15/2022 1354   ALT 12 08/16/2017 1034   BILITOT 0.7 04/15/2022 1354   BILITOT 0.4 02/28/2019 0911   BILITOT 0.70 08/16/2017 1034       RADIOGRAPHIC STUDIES: I have  personally reviewed the radiological images as listed and agreed with the findings in the report. No results found.   I connected with  Ethlyn Gallery on 10/19/22 by a telephone application and verified that I am speaking with the correct person using two identifiers.   I discussed the limitations of evaluation and management by telemedicine. The patient expressed understanding and agreed to proceed.  All questions were answered. The patient knows to call the clinic with any problems, questions or concerns.  I spent 11 minutes in the care of this patient including H and P, review of records, counseling and coordination of care.     Benay Pike, MD 10/19/2022 5:08 PM

## 2022-10-21 ENCOUNTER — Telehealth: Payer: Self-pay | Admitting: Hematology and Oncology

## 2022-10-21 NOTE — Telephone Encounter (Signed)
Contacted patient to scheduled appointments. Left message with appointment details and a call back number if patient had any questions or could not accommodate the time we provided.   

## 2022-10-29 MED FILL — Ferumoxytol Inj 510 MG/17ML (30 MG/ML) (Elemental Fe): INTRAVENOUS | Qty: 17 | Status: AC

## 2022-10-30 ENCOUNTER — Inpatient Hospital Stay: Payer: Medicaid Other

## 2022-10-30 VITALS — BP 104/63 | HR 60 | Temp 97.9°F | Resp 16

## 2022-10-30 DIAGNOSIS — D539 Nutritional anemia, unspecified: Secondary | ICD-10-CM

## 2022-10-30 DIAGNOSIS — D500 Iron deficiency anemia secondary to blood loss (chronic): Secondary | ICD-10-CM

## 2022-10-30 DIAGNOSIS — D509 Iron deficiency anemia, unspecified: Secondary | ICD-10-CM | POA: Diagnosis not present

## 2022-10-30 MED ORDER — SODIUM CHLORIDE 0.9 % IV SOLN: Freq: Once | INTRAVENOUS | Status: AC

## 2022-10-30 MED ORDER — SODIUM CHLORIDE 0.9 % IV SOLN
510.0000 mg | Freq: Once | INTRAVENOUS | Status: AC
  Administered 2022-10-30: 510 mg via INTRAVENOUS
  Filled 2022-10-30: qty 510

## 2022-11-01 ENCOUNTER — Telehealth: Payer: Self-pay

## 2022-11-01 NOTE — Telephone Encounter (Signed)
-----  Message from Benay Pike, MD sent at 11/01/2022 11:28 AM EST ----- Regarding: RE: patient has iron question If its ferraheme, its only 2 infusions.  Kathlee Nations, can you look into this. ----- Message ----- From: Herschell Dimes, RN Sent: 10/30/2022  10:42 AM EST To: Benay Pike, MD Subject: patient has iron question                      Hello, This patient was asking about your care plan for her iron - she thought 3 infusions were mentioned and she is only scheduled for 2. Can you have your nurse call her next week please to let her know if she is only getting the two this month or if she is due for one more too?  Thank you,  Amy Eudelia Bunch

## 2022-11-01 NOTE — Telephone Encounter (Signed)
Attempted to call Pt and family members with no success and no voicemails available. Will relay below message via [place].

## 2022-11-03 ENCOUNTER — Ambulatory Visit: Payer: Medicaid Other | Admitting: Family Medicine

## 2022-11-05 MED FILL — Ferumoxytol Inj 510 MG/17ML (30 MG/ML) (Elemental Fe): INTRAVENOUS | Qty: 17 | Status: AC

## 2022-11-06 ENCOUNTER — Inpatient Hospital Stay: Payer: Medicaid Other

## 2022-11-12 ENCOUNTER — Encounter: Payer: Self-pay | Admitting: Hematology and Oncology

## 2022-11-30 ENCOUNTER — Encounter: Payer: Self-pay | Admitting: Internal Medicine

## 2023-01-27 ENCOUNTER — Telehealth: Payer: Self-pay | Admitting: Hematology and Oncology

## 2023-01-27 NOTE — Telephone Encounter (Signed)
Spoke with patient confirming upcoming appointment change  

## 2023-02-03 ENCOUNTER — Encounter: Payer: Self-pay | Admitting: Obstetrics and Gynecology

## 2023-02-03 ENCOUNTER — Inpatient Hospital Stay: Payer: Medicaid Other | Admitting: Hematology and Oncology

## 2023-02-03 ENCOUNTER — Other Ambulatory Visit: Payer: Medicaid Other | Admitting: Obstetrics and Gynecology

## 2023-02-03 ENCOUNTER — Inpatient Hospital Stay: Payer: Medicaid Other

## 2023-02-03 NOTE — Patient Outreach (Addendum)
Medicaid Managed Care   Nurse Care Manager Note  02/03/2023 Name:  Jennifer Combs MRN:  [id number] DOB:  09-30-1986  Jennifer Combs is an 37 y.o. year old female who is a primary patient of Jennifer Skeans, MD.  The [facility] Managed Care Coordination team was consulted for assistance with:    Chronic healthcare management needs,macrocytic anemia, IBS, IDA, SDOH  Jennifer Combs was given information about Medicaid Managed Care Coordination team services today. Jennifer Combs Patient agreed to services and verbal consent obtained.  Engaged with patient by telephone for initial visit in response to provider referral for case management and/or care coordination services.   Assessments/Interventions:  Review of past medical history, allergies, medications, health status, including review of consultants reports, laboratory and other test data, was performed as part of comprehensive evaluation and provision of chronic care management services.  SDOH (Social Determinants of Health) assessments and interventions performed: SDOH Interventions    Flowsheet Row Patient Outreach Telephone from 02/03/2023 in [place] POPULATION HEALTH DEPARTMENT  SDOH Interventions   Transportation Interventions Intervention Not Indicated     Care Plan  Allergies  Allergen Reactions   Adhesive [Tape] Other (See Comments)    Adhesive from electrodes Leave burn marks on skin   Citrus    Tomato     MAKES MOUTH RAW.   Medications Reviewed Today     Reviewed by Jennifer Chandler, RN (Registered Nurse) on 02/03/23 at 1245  Med List Status: <None>   Medication Order Taking? Sig Documenting Provider Last Dose Status Informant           No Medications to Display                           Patient Active Problem List   Diagnosis Date Noted   Other stressful life events affecting family and household 02/28/2019   Mood disorder 02/28/2019   Menorrhagia 12/26/2017   IDA (iron deficiency anemia) 05/19/2017    Epigastric pain 04/26/2017   Macrocytic anemia 04/26/2017   Immunization counseling 10/05/2012   Pregnancy affected by fetal growth restriction 10/02/2012   Sterilization consult 07/24/2012   Pregnancy 07/06/2012   Short interval between pregnancies complicating pregnancy, antepartum 07/06/2012   Hx of preterm delivery, currently pregnant 04/08/2011   Hx of postpartum depression, currently pregnant 04/08/2011   Conditions to be addressed/monitored per PCP order: Chronic healthcare management needs,macrocytic anemia, IBS, IDA, SDOH  Care Plan : RN Care Manager Plan of Care  Updates made by Jennifer Chandler, RN since 02/03/2023 12:00 AM     Problem: Health Promotion or Disease Self-Management (General Plan of Care)      Long-Range Goal: Chronic Disease Management and SDOH Barriers   Start Date: 02/03/2023  Expected End Date: 05/05/2023  Priority: High  Note:   Current Barriers:  Knowledge Deficits related to plan of care for management of macrocytic anemia, IDA, IBS  Care Coordination needs related to Financial constraints related to rent and utilities Chronic Disease Management support and education needs related to macrocytic anemia, IDA, IBS  Financial Constraints  02/03/23:  Unable to complete INITIAL assessment as patient on lunch break and forgot about appt.  RNCM Clinical Goal(s):  Patient will verbalize understanding of plan for management of macrocytic anemia, IDA, IBS as evidenced by patient report verbalize basic understanding of macrocytic anemia, IDA, IBS  disease process and self health management plan as evidenced by patient report take all medications exactly as  prescribed and will call provider for medication related questions as evidenced by patient report demonstrate understanding of rationale for each prescribed medication as evidenced by patient  attend all scheduled medical appointments as evidenced by patient report and EMR review. demonstrate ongoing adherence  to prescribed treatment plan for macrocytic anemia, IDA, IBS as evidenced by patient report  continue to work with RN Care Manager to address care management and care coordination needs related to  macrocytic anemia, IDA, IBS as evidenced by adherence to CM Team Scheduled appointments work with Child psychotherapist to address  related to the management of  resources for dental and rent related to the management of macrocytic anemia, IDA, IBS  as evidenced by review of EMR and patient or Child psychotherapist report through collaboration with Medical illustrator, provider, and care team.   Interventions: Inter-disciplinary care team collaboration (see longitudinal plan of care) Evaluation of current treatment plan related to  self management and patient's adherence to plan as established by provider BSW referral for rent and utility resources 02/03/23:  Unable to complete INITIAL assessment as patient on lunch break and forgot about appt.    (Status:  New goal.)  Long Term Goal Evaluation of current treatment plan related to macrocytic anemia, IDA, IBS ,  self-management and patient's adherence to plan as established by provider. Discussed plans with patient for ongoing care management follow up and provided patient with direct contact information for care management team Reviewed medications with patient  Reviewed scheduled/upcoming provider appointments Social Work referral for resources Discussed plans with patient for ongoing care management follow up and provided patient with direct contact information for care management team Assessed social determinant of health barriers  SDOH Barriers (Status:  New goal.) Long Term Goal Patient interviewed and SDOH assessment performed        SDOH Interventions    Flowsheet Row Patient Outreach Telephone from 02/03/2023 in [place] POPULATION HEALTH DEPARTMENT  SDOH Interventions   Transportation Interventions Intervention Not Indicated     Patient interviewed and  appropriate assessments performed Discussed plans with patient for ongoing care management follow up and provided patient with direct contact information for care management team  Patient Goals/Self-Care Activities: Take all medications as prescribed Attend all scheduled provider appointments Call pharmacy for medication refills 3-7 days in advance of running out of medications Perform all self care activities independently  Perform IADL's (shopping, preparing meals, housekeeping, managing finances) independently Call provider office for new concerns or questions  Work with the social worker to address care coordination needs and will continue to work with the clinical team to address health care and disease management related needs  Follow Up Plan:  The patient has been provided with contact information for the care management team and has been advised to call with any health related questions or concerns.  The care management team will reach out to the patient again over the next 30 business  days.   Long-Range Goal: Establish Plan of Care for Chronic Disease Management Needs   Priority: High  Note:   Timeframe:  Long-Range Goal Priority:  High Start Date:     02/03/23                        Expected End Date: ongoing                      Follow Up Date 02/18/23    - practice safe sex - schedule  appointment for vaccines needed due to my age or health - schedule recommended health tests (blood work, mammogram, colonoscopy, pap test) - schedule and keep appointment for annual check-up    Why is this important?   Screening tests can find diseases early when they are easier to treat.  Your doctor or nurse will talk with you about which tests are important for you.  Getting shots for common diseases like the flu and shingles will help prevent them.     Follow Up:  Patient agrees to Care Plan and Follow-up.  Plan: The Managed Medicaid care management team will reach out to the patient  again over the next 30 business  days. and The  Patient has been provided with contact information for the Managed Medicaid care management team and has been advised to call with any health related questions or concerns.  Date/time of next scheduled RN care management/care coordination outreach: 02/18/23 at 315

## 2023-02-03 NOTE — Patient Instructions (Signed)
Hi Jennifer Combs, it was a pleasure speaking with you today-sorry I interrupted your lunch-have a great afternoon!  Jennifer Combs was given information about Medicaid Managed Care team care coordination services as a part of their [facility] Medicaid benefit. Jennifer Combs verbally consented to engagement with the [facility] Managed Care team.   If you are experiencing a medical emergency, please call 911 or report to your local emergency department or urgent care.   If you have a non-emergency medical problem during routine business hours, please contact your provider's office and ask to speak with a nurse.   For questions related to your [facility] health plan, please call: [phone] or go here:[email or web address]  If you would like to schedule transportation through your [facility] plan, please call the following number at least 2 days in advance of your appointment: [phone].  You can also use the MTM portal or MTM mobile app to manage your rides. For the portal, please go to [email or web address]  Call the [facility] Crisis Line at [phone], at any time, 24 hours a day, 7 days a week. If you are in danger or need immediate medical attention call 911.  If you would like help to quit smoking, call 1-800-QUIT-NOW ((249) 562-5941) OR Espaol: 1-855-Djelo-Ya (4-401-027-2536) o para ms informacin haga clic aqu or Text READY to 644-034 to register via text  Jennifer Combs - following are the goals we discussed in your visit today:   Goals Addressed    Timeframe:  Long-Range Goal Priority:  High Start Date:     02/03/23                        Expected End Date: ongoing                      Follow Up Date 02/18/23    - practice safe sex - schedule appointment for vaccines needed due to my age or health - schedule recommended health tests (blood work, mammogram, colonoscopy, pap test) - schedule and keep appointment for annual check-up    Why is this  important?   Screening tests can find diseases early when they are easier to treat.  Your doctor or nurse will talk with you about which tests are important for you.  Getting shots for common diseases like the flu and shingles will help prevent them.    Patient verbalizes understanding of instructions and care plan provided today and agrees to view in MyChart. Active MyChart status and patient understanding of how to access instructions and care plan via MyChart confirmed with patient.     The Managed Medicaid care management team will reach out to the patient again over the next 30 business  days.  The  Patient  has been provided with contact information for the Managed Medicaid care management team and has been advised to call with any health related questions or concerns.   Kathi Der RN, BSN [place]  [facility] Care Management Coordinator - Managed Medicaid High Risk [phone]   Following is a copy of your plan of care:  Care Plan : RN Care Manager Plan of Care  Updates made by Danie Chandler, RN since 02/03/2023 12:00 AM     Problem: Health Promotion or Disease Self-Management (General Plan of Care)      Long-Range Goal: Chronic Disease Management and SDOH Barriers   Start Date: 02/03/2023  Expected End Date: 05/05/2023  Priority: High  Note:  Current Barriers:  Knowledge Deficits related to plan of care for management of macrocytic anemia, IDA, IBS  Care Coordination needs related to Financial constraints related to rent and utilities Chronic Disease Management support and education needs related to macrocytic anemia, IDA, IBS  Financial Constraints  02/03/23:  Unable to complete INITIAL assessment as patient on lunch break and forgot about appt.  RNCM Clinical Goal(s):  Patient will verbalize understanding of plan for management of macrocytic anemia, IDA, IBS as evidenced by patient report verbalize basic understanding of macrocytic anemia, IDA, IBS   disease process and self health management plan as evidenced by patient report take all medications exactly as prescribed and will call provider for medication related questions as evidenced by patient report demonstrate understanding of rationale for each prescribed medication as evidenced by patient  attend all scheduled medical appointments as evidenced by patient report and EMR review. demonstrate ongoing adherence to prescribed treatment plan for macrocytic anemia, IDA, IBS as evidenced by patient report  continue to work with RN Care Manager to address care management and care coordination needs related to  macrocytic anemia, IDA, IBS as evidenced by adherence to CM Team Scheduled appointments work with Child psychotherapist to address  related to the management of  resources for dental and rent related to the management of macrocytic anemia, IDA, IBS  as evidenced by review of EMR and patient or Child psychotherapist report through collaboration with Medical illustrator, provider, and care team.   Interventions: Inter-disciplinary care team collaboration (see longitudinal plan of care) Evaluation of current treatment plan related to  self management and patient's adherence to plan as established by provider BSW referral for rent and utility resources 02/03/23:  Unable to complete INITIAL assessment as patient on lunch break and forgot about appt.    (Status:  New goal.)  Long Term Goal Evaluation of current treatment plan related to macrocytic anemia, IDA, IBS ,  self-management and patient's adherence to plan as established by provider. Discussed plans with patient for ongoing care management follow up and provided patient with direct contact information for care management team Reviewed medications with patient  Reviewed scheduled/upcoming provider appointments Social Work referral for resources Discussed plans with patient for ongoing care management follow up and provided patient with direct contact  information for care management team Assessed social determinant of health barriers  SDOH Barriers (Status:  New goal.) Long Term Goal Patient interviewed and SDOH assessment performed        SDOH Interventions    Flowsheet Row Patient Outreach Telephone from 02/03/2023 in [place] POPULATION HEALTH DEPARTMENT  SDOH Interventions   Transportation Interventions Intervention Not Indicated     Patient interviewed and appropriate assessments performed Discussed plans with patient for ongoing care management follow up and provided patient with direct contact information for care management team  Patient Goals/Self-Care Activities: Take all medications as prescribed Attend all scheduled provider appointments Call pharmacy for medication refills 3-7 days in advance of running out of medications Perform all self care activities independently  Perform IADL's (shopping, preparing meals, housekeeping, managing finances) independently Call provider office for new concerns or questions  Work with the social worker to address care coordination needs and will continue to work with the clinical team to address health care and disease management related needs  Follow Up Plan:  The patient has been provided with contact information for the care management team and has been advised to call with any health related questions or concerns.  The care  management team will reach out to the patient again over the next 30 business  days.

## 2023-02-04 ENCOUNTER — Other Ambulatory Visit: Payer: Medicaid Other

## 2023-02-04 NOTE — Patient Outreach (Signed)
Medicaid Managed Care Social Work Note  02/04/2023 Name:  Jennifer Combs MRN:  [id number] DOB:  1986/08/07  Golden Hurter Ercole is an 37 y.o. year old female who is a primary patient of Georganna Skeans, MD.  The Medicaid Managed Care Coordination team was consulted for assistance with:  Community Resources   Ms. Cassara was given information about Medicaid Managed Care Coordination team services today. [name] Patient agreed to services and verbal consent obtained.  Engaged with patient  for by telephone forinitial visit in response to referral for case management and/or care coordination services.   Assessments/Interventions:  Review of past medical history, allergies, medications, health status, including review of consultants reports, laboratory and other test data, was performed as part of comprehensive evaluation and provision of chronic care management services.  SDOH: (Social Determinant of Health) assessments and interventions performed: SDOH Interventions    Flowsheet Row Patient Outreach Telephone from 02/03/2023 in [place] POPULATION HEALTH DEPARTMENT  SDOH Interventions   Transportation Interventions Intervention Not Indicated      BSW completed a telephone outreach with mom. She stated she is in school full time and does not work, she was only able to pay half of her rent this month. She does not have a car, and is behind on rent and utilities. She does receive foodstamps and receives food from her mom that stay in [facility]. BSW will email patient a list of resources for assistance. Advanced Directives Status:  Not addressed in this encounter.  Care Plan                 Allergies  Allergen Reactions   Adhesive [Tape] Other (See Comments)    Adhesive from electrodes Leave burn marks on skin   Citrus    Tomato     MAKES MOUTH RAW.    Medications Reviewed Today     Reviewed by Danie Chandler, RN (Registered Nurse) on 02/03/23 at 1245  Med List Status:  <None>   Medication Order Taking? Sig Documenting Provider Last Dose Status Informant           No Medications to Display                            Patient Active Problem List   Diagnosis Date Noted   Other stressful life events affecting family and household 02/28/2019   Mood disorder 02/28/2019   Menorrhagia 12/26/2017   IDA (iron deficiency anemia) 05/19/2017   Epigastric pain 04/26/2017   Macrocytic anemia 04/26/2017   Immunization counseling 10/05/2012   Pregnancy affected by fetal growth restriction 10/02/2012   Sterilization consult 07/24/2012   Pregnancy 07/06/2012   Short interval between pregnancies complicating pregnancy, antepartum 07/06/2012   Hx of preterm delivery, currently pregnant 04/08/2011   Hx of postpartum depression, currently pregnant 04/08/2011    Conditions to be addressed/monitored per PCP order:   community resources  Care Plan : RN Care Manager Plan of Care  Updates made by Shaune Leeks since 02/04/2023 12:00 AM     Problem: Health Promotion or Disease Self-Management (General Plan of Care)      Long-Range Goal: Chronic Disease Management and SDOH Barriers   Start Date: 02/03/2023  Expected End Date: 05/05/2023  Priority: High  Note:   Current Barriers:  Knowledge Deficits related to plan of care for management of macrocytic anemia, IDA, IBS  Care Coordination needs related to Financial constraints related to rent and  utilities Chronic Disease Management support and education needs related to macrocytic anemia, IDA, IBS  Financial Constraints  02/03/23:  Unable to complete INITIAL assessment as patient on lunch break and forgot about appt.  RNCM Clinical Goal(s):  Patient will verbalize understanding of plan for management of macrocytic anemia, IDA, IBS as evidenced by patient report verbalize basic understanding of macrocytic anemia, IDA, IBS  disease process and self health management plan as evidenced by patient report take all  medications exactly as prescribed and will call provider for medication related questions as evidenced by patient report demonstrate understanding of rationale for each prescribed medication as evidenced by patient  attend all scheduled medical appointments as evidenced by patient report and EMR review. demonstrate ongoing adherence to prescribed treatment plan for macrocytic anemia, IDA, IBS as evidenced by patient report  continue to work with RN Care Manager to address care management and care coordination needs related to  macrocytic anemia, IDA, IBS as evidenced by adherence to CM Team Scheduled appointments work with Child psychotherapist to address  related to the management of  resources for dental and rent related to the management of macrocytic anemia, IDA, IBS  as evidenced by review of EMR and patient or Child psychotherapist report through collaboration with Medical illustrator, provider, and care team.   Interventions: Inter-disciplinary care team collaboration (see longitudinal plan of care) Evaluation of current treatment plan related to  self management and patient's adherence to plan as established by provider BSW referral for rent and utility resources 02/03/23:  Unable to complete INITIAL assessment as patient on lunch break and forgot about appt.    (Status:  New goal.)  Long Term Goal Evaluation of current treatment plan related to macrocytic anemia, IDA, IBS ,  self-management and patient's adherence to plan as established by provider. Discussed plans with patient for ongoing care management follow up and provided patient with direct contact information for care management team Reviewed medications with patient  Reviewed scheduled/upcoming provider appointments Social Work referral for resources Discussed plans with patient for ongoing care management follow up and provided patient with direct contact information for care management team Assessed social determinant of health barriers BSW  completed a telephone outreach with mom. She stated she is in school full time and does not work, she was only able to pay half of her rent this month. She does not have a car, and is behind on rent and utilities. She does receive foodstamps and receives food from her mom that stay in [facility]. BSW will email patient a list of resources for assistance.  SDOH Barriers (Status:  New goal.) Long Term Goal Patient interviewed and SDOH assessment performed        SDOH Interventions    Flowsheet Row Patient Outreach Telephone from 02/03/2023 in [place] POPULATION HEALTH DEPARTMENT  SDOH Interventions   Transportation Interventions Intervention Not Indicated     Patient interviewed and appropriate assessments performed Discussed plans with patient for ongoing care management follow up and provided patient with direct contact information for care management team  Patient Goals/Self-Care Activities: Take all medications as prescribed Attend all scheduled provider appointments Call pharmacy for medication refills 3-7 days in advance of running out of medications Perform all self care activities independently  Perform IADL's (shopping, preparing meals, housekeeping, managing finances) independently Call provider office for new concerns or questions  Work with the social worker to address care coordination needs and will continue to work with the clinical team to address health  care and disease management related needs  Follow Up Plan:  The patient has been provided with contact information for the care management team and has been advised to call with any health related questions or concerns.  The care management team will reach out to the patient again over the next 30 business  days.      Follow up:  Patient agrees to Care Plan and Follow-up.  Plan: The Managed Medicaid care management team will reach out to the patient again over the next 30 days.  Date/time of next scheduled Social  Work care management/care coordination outreach:  03/07/23  [name], [name], [facility] Health  Managed [facility] Social Worker [phone]

## 2023-02-04 NOTE — Patient Instructions (Signed)
Visit Information  Ms. Jennifer Combs was given information about Medicaid Managed Care team care coordination services as a part of their [facility] Medicaid benefit. Jennifer Combs verbally consented to engagement with the [facility] Managed Care team.   If you are experiencing a medical emergency, please call 911 or report to your local emergency department or urgent care.   If you have a non-emergency medical problem during routine business hours, please contact your provider's office and ask to speak with a nurse.   For questions related to your Jennifer Combs health plan, please call: [phone] or go here:[email or web address]  If you would like to schedule transportation through your [facility] plan, please call the following number at least 2 days in advance of your appointment: [phone].  You can also use the MTM portal or MTM mobile app to manage your rides. For the portal, please go to [email or web address]  Call the [facility] Crisis Line at [phone], at any time, 24 hours a day, 7 days a week. If you are in danger or need immediate medical attention call 911.  If you would like help to quit smoking, call 1-800-QUIT-NOW ((424)364-1084) OR Espaol: 1-855-Djelo-Ya (3-664-403-4742) o para ms informacin haga clic aqu or Text READY to 595-638 to register via text  Jennifer Combs - following are the goals we discussed in your visit today:   Goals Addressed   None      Social Worker will follow up in 30 days.   Jennifer Combs, Jennifer Combs, MHA [facility] Health  Managed Medicaid Social Worker [phone]   Following is a copy of your plan of care:  Care Plan : RN Care Manager Plan of Care  Updates made by Jennifer Combs since 02/04/2023 12:00 AM     Problem: Health Promotion or Disease Self-Management (General Plan of Care)      Long-Range Goal: Chronic Disease Management and SDOH Barriers   Start Date: 02/03/2023  Expected End Date: 05/05/2023  Priority:  High  Note:   Current Barriers:  Knowledge Deficits related to plan of care for management of macrocytic anemia, IDA, IBS  Care Coordination needs related to Financial constraints related to rent and utilities Chronic Disease Management support and education needs related to macrocytic anemia, IDA, IBS  Financial Constraints  02/03/23:  Unable to complete INITIAL assessment as patient on lunch break and forgot about appt.  RNCM Clinical Goal(s):  Patient will verbalize understanding of plan for management of macrocytic anemia, IDA, IBS as evidenced by patient report verbalize basic understanding of macrocytic anemia, IDA, IBS  disease process and self health management plan as evidenced by patient report take all medications exactly as prescribed and will call provider for medication related questions as evidenced by patient report demonstrate understanding of rationale for each prescribed medication as evidenced by patient  attend all scheduled medical appointments as evidenced by patient report and EMR review. demonstrate ongoing adherence to prescribed treatment plan for macrocytic anemia, IDA, IBS as evidenced by patient report  continue to work with RN Care Manager to address care management and care coordination needs related to  macrocytic anemia, IDA, IBS as evidenced by adherence to CM Team Scheduled appointments work with Child psychotherapist to address  related to the management of  resources for dental and rent related to the management of macrocytic anemia, IDA, IBS  as evidenced by review of EMR and patient or Child psychotherapist report through collaboration with Medical illustrator, provider, and care team.   Interventions: Inter-disciplinary care team collaboration (  see longitudinal plan of care) Evaluation of current treatment plan related to  self management and patient's adherence to plan as established by provider BSW referral for rent and utility resources 02/03/23:  Unable to complete  INITIAL assessment as patient on lunch break and forgot about appt.    (Status:  New goal.)  Long Term Goal Evaluation of current treatment plan related to macrocytic anemia, IDA, IBS ,  self-management and patient's adherence to plan as established by provider. Discussed plans with patient for ongoing care management follow up and provided patient with direct contact information for care management team Reviewed medications with patient  Reviewed scheduled/upcoming provider appointments Social Work referral for resources Discussed plans with patient for ongoing care management follow up and provided patient with direct contact information for care management team Assessed social determinant of health barriers BSW completed a telephone outreach with mom. She stated she is in school full time and does not work, she was only able to pay half of her rent this month. She does not have a car, and is behind on rent and utilities. She does receive foodstamps and receives food from her mom that stay in [employer] [facility]. BSW will email patient a list of resources for assistance.  SDOH Barriers (Status:  New goal.) Long Term Goal Patient interviewed and SDOH assessment performed        SDOH Interventions    Flowsheet Row Patient Outreach Telephone from 02/03/2023 in [place] POPULATION HEALTH DEPARTMENT  SDOH Interventions   Transportation Interventions Intervention Not Indicated     Patient interviewed and appropriate assessments performed Discussed plans with patient for ongoing care management follow up and provided patient with direct contact information for care management team  Patient Goals/Self-Care Activities: Take all medications as prescribed Attend all scheduled provider appointments Call pharmacy for medication refills 3-7 days in advance of running out of medications Perform all self care activities independently  Perform IADL's (shopping, preparing meals, housekeeping, managing  finances) independently Call provider office for new concerns or questions  Work with the social worker to address care coordination needs and will continue to work with the clinical team to address health care and disease management related needs  Follow Up Plan:  The patient has been provided with contact information for the care management team and has been advised to call with any health related questions or concerns.  The care management team will reach out to the patient again over the next 30 business  days.

## 2023-02-08 ENCOUNTER — Ambulatory Visit (INDEPENDENT_AMBULATORY_CARE_PROVIDER_SITE_OTHER): Payer: Medicaid Other | Admitting: Internal Medicine

## 2023-02-08 ENCOUNTER — Encounter: Payer: Self-pay | Admitting: Internal Medicine

## 2023-02-08 VITALS — BP 120/80 | HR 82 | Ht 59.0 in | Wt 159.0 lb

## 2023-02-08 DIAGNOSIS — K219 Gastro-esophageal reflux disease without esophagitis: Secondary | ICD-10-CM

## 2023-02-08 DIAGNOSIS — R1013 Epigastric pain: Secondary | ICD-10-CM

## 2023-02-08 DIAGNOSIS — R1010 Upper abdominal pain, unspecified: Secondary | ICD-10-CM | POA: Diagnosis not present

## 2023-02-08 DIAGNOSIS — Z8619 Personal history of other infectious and parasitic diseases: Secondary | ICD-10-CM

## 2023-02-08 DIAGNOSIS — R1030 Lower abdominal pain, unspecified: Secondary | ICD-10-CM

## 2023-02-08 NOTE — Progress Notes (Signed)
HPI: Jennifer Combs is a 37 year old female known to me from evaluation in 2018 in 2019 with history of H. pylori gastritis and probable irritable bowel, history of anxiety and depression, and anemia who is here to evaluate abdominal bloating and discomfort.  She is here today with her 76 year old son.  She reports that she has discovered that meats particularly beef, chicken and [place] cause her to have significant bloating, crampy abdominal pain and loose stool.  This can often be urgent.  If she avoids these foods she does not have much of these symptoms.  She will still develop acid reflux with apple juice and even some acidic fruits.  So she avoids these as well.  She is not having trouble swallowing.  She has not seen blood in her stool or melena.  She has some intermittent nausea but no vomiting.  She is in school to become a nail tech and should graduate in the next several weeks.  She has been very active and walking to and from school and has lost about 20 pounds with this activity.  She lost her grandfather to cancer recently which has been difficult.  She has 4 children ages 42, 40, 81 and 75. She is single. She does smoke cigarettes Drinks about 2 shots of liquor about 2 days a week Some marijuana use  Past Medical History:  Diagnosis Date   Allergy    Anemia    Anxiety    Depression    Ectopic pregnancy, tubal    GERD (gastroesophageal reflux disease)    H. pylori infection    IBS (irritable bowel syndrome)    Internal hemorrhoids    Ovarian cyst bil   PONV (postoperative nausea and vomiting)    Trichomonas infection     Past Surgical History:  Procedure Laterality Date   CYSTOSCOPY N/A 12/26/2017   Procedure: CYSTOSCOPY;  Surgeon: Jerene Bears, MD;  Location: [facility];  Service: Gynecology;  Laterality: N/A;   TOTAL LAPAROSCOPIC HYSTERECTOMY WITH SALPINGECTOMY Bilateral 12/26/2017   Procedure: TOTAL LAPAROSCOPIC HYSTERECTOMY WITH SALPINGECTOMY;   Surgeon: Jerene Bears, MD;  Location: [facility];  Service: Gynecology;  Laterality: Bilateral;   TUBAL LIGATION      No outpatient medications prior to visit.   No facility-administered medications prior to visit.    Allergies  Allergen Reactions   Adhesive [Tape] Other (See Comments)    Adhesive from electrodes Leave burn marks on skin   Citrus    Tomato     MAKES MOUTH RAW.    Family History  Problem Relation Age of Onset   Endometriosis Mother    Anemia Maternal Grandmother    Clotting disorder Maternal Grandmother    Thyroid disease Maternal Grandmother    Migraines Maternal Grandmother        "something wrong with her eyes"   Migraines Other    Schizophrenia Other     Social History   Tobacco Use   Smoking status: Former    Packs/day: .1    Types: Cigarettes   Smokeless tobacco: Never   Tobacco comments:    patient down to 2 cigarettes per day  Vaping Use   Vaping Use: Former  Substance Use Topics   Alcohol use: Yes    Comment: very rare   Drug use: Yes    Frequency: 28.0 times per week    Types: Marijuana    Comment: uses for pain relief, 4 per day    ROS: As per  history of present illness, otherwise negative  BP 120/80   Pulse 82   Ht  (1.499 m)   Wt 159 lb (72.1 kg)   LMP 12/13/2017 (Exact Date)   BMI 32.11 kg/m  Gen: awake, alert, NAD HEENT: anicteric  CV: RRR, no mrg Pulm: CTA b/l Abd: soft, diffusely tender without rebound or guarding, nondistended  +BS throughout Ext: no c/c/e Neuro: nonfocal   RELEVANT LABS AND IMAGING: CBC    Component Value Date/Time   WBC 4.6 09/22/2022 1037   RBC 3.35 (L) 09/22/2022 1037   HGB 11.3 (L) 09/22/2022 1037   HGB 10.8 (L) 02/28/2019 0911   HGB 10.5 (L) 08/16/2017 1034   HCT 34.3 (L) 09/22/2022 1037   HCT 31.7 (L) 02/28/2019 0911   HCT 30.9 (L) 08/16/2017 1034   PLT 263 09/22/2022 1037   PLT 229 02/28/2019 0911   MCV 102.4 (H) 09/22/2022 1037   MCV 99 (H)  02/28/2019 0911   MCV 101 08/16/2017 1034   MCH 33.7 09/22/2022 1037   MCHC 32.9 09/22/2022 1037   RDW 11.9 09/22/2022 1037   RDW 11.1 (L) 02/28/2019 0911   RDW 11.6 08/16/2017 1034   LYMPHSABS 2.3 09/22/2022 1037   LYMPHSABS 2.1 02/28/2019 0911   LYMPHSABS 1.8 08/16/2017 1034   MONOABS 0.2 09/22/2022 1037   EOSABS 0.1 09/22/2022 1037   EOSABS 0.1 02/28/2019 0911   EOSABS 0.1 08/16/2017 1034   BASOSABS 0.0 09/22/2022 1037   BASOSABS 0.0 02/28/2019 0911   BASOSABS 0.0 08/16/2017 1034    CMP     Component Value Date/Time   NA 139 04/15/2022 1354   NA 137 02/28/2019 0911   NA 140 08/16/2017 1034   K 3.5 04/15/2022 1354   K 3.4 (L) 08/16/2017 1034   CL 107 04/15/2022 1354   CL 106 05/18/2017 1310   CO2 25 04/15/2022 1354   CO2 24 08/16/2017 1034   GLUCOSE 99 04/15/2022 1354   GLUCOSE 100 08/16/2017 1034   BUN 11 04/15/2022 1354   BUN 9 02/28/2019 0911   BUN 11.8 08/16/2017 1034   CREATININE 0.83 04/15/2022 1354   CREATININE 0.8 08/16/2017 1034   CALCIUM 9.1 04/15/2022 1354   CALCIUM 9.2 08/16/2017 1034   PROT 7.1 04/15/2022 1354   PROT 7.0 02/28/2019 0911   PROT 6.9 08/16/2017 1034   ALBUMIN 4.0 04/15/2022 1354   ALBUMIN 4.4 02/28/2019 0911   ALBUMIN 3.9 08/16/2017 1034   AST 21 04/15/2022 1354   AST 21 08/16/2017 1034   ALT 12 04/15/2022 1354   ALT 12 08/16/2017 1034   ALKPHOS 54 04/15/2022 1354   ALKPHOS 48 08/16/2017 1034   BILITOT 0.7 04/15/2022 1354   BILITOT 0.4 02/28/2019 0911   BILITOT 0.70 08/16/2017 1034   GFRNONAA >60 04/15/2022 1354   GFRAA 134 02/28/2019 0911    ASSESSMENT/PLAN: 37 year old female known to me from evaluation in 2018 in 2019 with history of H. pylori gastritis and probable irritable bowel, history of anxiety and depression, and anemia who is here to evaluate abdominal bloating and discomfort.    Epigastric pain/lower abdominal pain/abdominal bloating --it does seem that she has either a meat allergy or at least intolerance.  I  have encouraged her to avoid the known trigger foods such as beef, [place] and chicken.  She is unsure if pork bothers her but she does not eat this on a regular basis.  Given her history of H. pylori we will do the following: -- H. pylori stool antigen  with Diatherix -- Given pain complete abdominal ultrasound; also attention to exclude gallbladder pathology -- Avoid trigger meat foods if symptoms worsen consider allergy and immunology evaluation  2.  Acid reflux --intermittent.  No alarm symptoms.  Avoid trigger foods.  If problematic consider famotidine 20 mg twice daily as needed  3.  Colon cancer screening --no polyps on prior colonoscopy performed in August 2018. -- Screening colonoscopy would be recommended for her at age 54      Cc:Georganna Skeans, Md [address] [place],  [place] [address]

## 2023-02-08 NOTE — Patient Instructions (Signed)
_______________________________________________________  If your blood pressure at your visit was 140/90 or greater, please contact your primary care physician to follow up on this.  If you are age 35 or younger, your body mass index should be between 19-25. Your Body mass index is 32.11 kg/m. If this is out of the aformentioned range listed, please consider follow up with your Primary Care Provider.  ________________________________________________________  The [facility] providers would like to encourage you to use [facility] to communicate with providers for non-urgent requests or questions.  Due to long hold times on the telephone, sending your provider a message by [facility] may be a faster and more efficient way to get a response.  Please allow 48 business hours for a response.  Please remember that this is for non-urgent requests.  _______________________________________________________  [name] have been scheduled for an abdominal ultrasound at [facility] on 02-12-23 at 10am. Please arrive 15 minutes prior to your appointment for registration. Make certain not to have anything to eat or drink after midnight prior to your appointment. Should you need to reschedule your appointment, please contact radiology at [phone]. This test typically takes about 30 minutes to perform.  Due to recent changes in healthcare laws, you may see the results of your imaging and laboratory studies on MyChart before your provider has had a chance to review them.  We understand that in some cases there may be results that are confusing or concerning to you. Not all laboratory results come back in the same time frame and the provider may be waiting for multiple results in order to interpret others.  Please give [place] 48 hours in order for your provider to thoroughly review all the results before contacting the office for clarification of your results.   Please avoid consuming meats.  Your provider has ordered  "Diatherix" stool testing for you. You have received a kit from our office today containing all necessary supplies to complete this test. Please carefully read the stool collection instructions provided in the kit before opening the accompanying materials. In addition, be sure to place the label from the top left corner of the laboratory request sheet onto the "puritan opti-swab" tube that is supplied in the kit. This label should include your full name and date of birth. After completing the test, you should secure the purtian tube into the specimen biohazard bag. The laboratory request information sheet (including date and time of specimen collection) should be placed into the outside pocket of the specimen biohazard bag and returned to the [facility] lab with 2 days of collection.   Thank you for entrusting me with your care and choosing [facility].  Dr Rhea Belton

## 2023-02-12 ENCOUNTER — Emergency Department (HOSPITAL_COMMUNITY): Payer: Medicaid Other

## 2023-02-12 ENCOUNTER — Emergency Department (HOSPITAL_COMMUNITY)
Admission: EM | Admit: 2023-02-12 | Discharge: 2023-02-12 | Disposition: A | Discharge status: Home / Self Care | Payer: Medicaid Other | Attending: Emergency Medicine | Admitting: Emergency Medicine

## 2023-02-12 ENCOUNTER — Ambulatory Visit (HOSPITAL_COMMUNITY)
Admission: RE | Admit: 2023-02-12 | Discharge: 2023-02-12 | Disposition: A | Discharge status: Home / Self Care | Payer: Medicaid Other | Source: Ambulatory Visit | Attending: Internal Medicine | Admitting: Internal Medicine

## 2023-02-12 ENCOUNTER — Encounter (HOSPITAL_COMMUNITY): Payer: Self-pay

## 2023-02-12 ENCOUNTER — Other Ambulatory Visit: Payer: Self-pay

## 2023-02-12 DIAGNOSIS — M79641 Pain in right hand: Secondary | ICD-10-CM | POA: Diagnosis not present

## 2023-02-12 DIAGNOSIS — R1030 Lower abdominal pain, unspecified: Secondary | ICD-10-CM | POA: Diagnosis present

## 2023-02-12 DIAGNOSIS — M79644 Pain in right finger(s): Secondary | ICD-10-CM | POA: Insufficient documentation

## 2023-02-12 DIAGNOSIS — R1013 Epigastric pain: Secondary | ICD-10-CM | POA: Diagnosis present

## 2023-02-12 DIAGNOSIS — Z8619 Personal history of other infectious and parasitic diseases: Secondary | ICD-10-CM | POA: Insufficient documentation

## 2023-02-12 DIAGNOSIS — R1010 Upper abdominal pain, unspecified: Secondary | ICD-10-CM

## 2023-02-12 DIAGNOSIS — R2231 Localized swelling, mass and lump, right upper limb: Secondary | ICD-10-CM | POA: Insufficient documentation

## 2023-02-12 MED ORDER — ACETAMINOPHEN 325 MG PO TABS
650.0000 mg | ORAL_TABLET | Freq: Once | ORAL | Status: AC
  Administered 2023-02-12: 650 mg via ORAL
  Filled 2023-02-12: qty 2

## 2023-02-12 MED ORDER — ACETAMINOPHEN 325 MG PO TABS: 650.0000 mg | ORAL_TABLET | Freq: Four times a day (QID) | ORAL | 0 refills | Status: DC | PRN

## 2023-02-12 NOTE — ED Provider Notes (Signed)
[place] EMERGENCY DEPARTMENT AT [facility] Provider Note   CSN: [id number] Arrival date & time: 02/12/23  1036     History  Chief Complaint  Patient presents with   Hand Pain    Jennifer Combs is a 37 y.o. female with a past medical history of anxiety, GERD, IBS presents today for evaluation of right hand pain.  Patient reports she started to have pain in her right hand about 2 weeks ago.  The pain is mostly on her middle and ring finger.  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  She denies any injury to the hands.  Denies any recent fall.  States her right fingers look more swollen than the left once.  Patient states she has not tried any medication for pain.  States she avoid medications unless she has to.   Hand Pain      Past Medical History:  Diagnosis Date   Allergy    Anemia    Anxiety    Depression    Ectopic pregnancy, tubal    GERD (gastroesophageal reflux disease)    H. pylori infection    IBS (irritable bowel syndrome)    Internal hemorrhoids    Ovarian cyst bil   PONV (postoperative nausea and vomiting)    Trichomonas infection    Past Surgical History:  Procedure Laterality Date   CYSTOSCOPY N/A 12/26/2017   Procedure: CYSTOSCOPY;  Surgeon: Jerene Bears, MD;  Location: [facility];  Service: Gynecology;  Laterality: N/A;   TOTAL LAPAROSCOPIC HYSTERECTOMY WITH SALPINGECTOMY Bilateral 12/26/2017   Procedure: TOTAL LAPAROSCOPIC HYSTERECTOMY WITH SALPINGECTOMY;  Surgeon: Jerene Bears, MD;  Location: [facility];  Service: Gynecology;  Laterality: Bilateral;   TUBAL LIGATION       Home Medications Prior to Admission medications   Not on File      Allergies    Adhesive [tape], Citrus, and Tomato    Review of Systems   Review of Systems Negative except as per HPI.  Physical Exam Updated Vital Signs BP 113/69 (BP Location: Left Arm)   Pulse  70   Resp 16   Ht 4\' 11"  (1.499 m)   Wt 72.1 kg   LMP 12/13/2017 (Exact Date)   SpO2 100%   BMI 32.11 kg/m  Physical Exam Vitals and nursing note reviewed.  Constitutional:      Appearance: Normal appearance.  HENT:     Head: Normocephalic and atraumatic.     Mouth/Throat:     Mouth: Mucous membranes are moist.  Eyes:     General: No scleral icterus. Cardiovascular:     Rate and Rhythm: Normal rate and regular rhythm.     Pulses: Normal pulses.     Heart sounds: Normal heart sounds.  Pulmonary:     Effort: Pulmonary effort is normal.     Breath sounds: Normal breath sounds.  Abdominal:     General: Abdomen is flat.     Palpations: Abdomen is soft.     Tenderness: There is no abdominal tenderness.  Musculoskeletal:        General: No deformity.     Comments: Tenderness to palpation to right third and fourth fingers.  Very minimal swelling noted.  No hematoma or laceration noted.  Neurovascular function of the right hand intact.  Skin:    General: Skin is warm.     Findings: No rash.  Neurological:  General: No focal deficit present.     Mental Status: She is alert.  Psychiatric:        Mood and Affect: Mood normal.     ED Results / Procedures / Treatments   Labs (all labs ordered are listed, but only abnormal results are displayed) Labs Reviewed - No data to display  EKG None  Radiology No results found.  Procedures Procedures    Medications Ordered in ED Medications  acetaminophen (TYLENOL) tablet 650 mg (has no administration in time range)    ED Course/ Medical Decision Making/ A&P                             Medical Decision Making Amount and/or Complexity of Data Reviewed Radiology: ordered.  Risk OTC drugs.   This patient presents to the ED for R hand pain, this involves an extensive number of treatment options, and is a complaint that carries with a high risk of complications and morbidity.  The differential diagnosis includes  fracture, dislocation, cellulitis, ligament injury.  This is not an exhaustive list.  Imaging studies: I ordered imaging studies. I personally reviewed, interpreted imaging and agree with the radiologist's interpretations. The results include: X-ray of the right hand negative.  Problem list/ ED course/ Critical interventions/ Medical management: HPI: See above Vital signs within normal range and stable throughout visit. Laboratory/imaging studies significant for: See above. On physical examination, patient is afebrile and appears in no acute distress.  There was tenderness to palpation to the middle and ring finger of the right hand.  Very minimal swelling noted.  Neurovascular function of the right hand intact.  Skin is cool to touch.  X-ray of the right hand showed no evidence of osseous abnormality.  I suspect this is musculoskeletal pain.  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  Given history and examination, I have low suspicion for gout, rheumatoid arthritis, osteoarthritis or septic joint.  Advised patient to take Tylenol/ibuprofen for pain, follow-up with her PCP for further evaluation and management.  Strict return precaution given. I have reviewed the patient home medicines and have made adjustments as needed.  Cardiac monitoring/EKG: The patient was maintained on a cardiac monitor.  I personally reviewed and interpreted the cardiac monitor which showed an underlying rhythm of: sinus rhythm.  Additional history obtained: External records from outside source obtained and reviewed including: Chart review including previous notes, labs, imaging.  Consultations obtained:  Disposition Continued outpatient therapy. Follow-up with PCP recommended for reevaluation of symptoms. Treatment plan discussed with patient.  Pt acknowledged understanding was agreeable to the plan. Worrisome signs and symptoms were discussed  with patient, and patient acknowledged understanding to return to the ED if they noticed these signs and symptoms. Patient was stable upon discharge.   This chart was dictated using voice recognition software.  Despite best efforts to proofread,  errors can occur which can change the documentation meaning.          Final Clinical Impression(s) / ED Diagnoses Final diagnoses:  Pain of right hand    Rx / DC Orders ED Discharge Orders          Ordered    acetaminophen (TYLENOL) 325 MG tablet  Every 6 hours PRN        02/12/23 1225              Corey Caulfield,  [place], [place] 02/12/23 1230    Cathren Laine, MD 02/12/23 [phone]

## 2023-02-12 NOTE — Discharge Instructions (Addendum)
Your x-ray was reassuring today.  Please take tylenol/ibuprofen for pain.  Can also try ice/heat pads for symptom relief. I recommend close follow-up with PCP for reevaluation.  Please do not hesitate to return to emergency department if worrisome signs symptoms we discussed become apparent.

## 2023-02-12 NOTE — ED Triage Notes (Signed)
Pt to er, pt states that she is here for some R hand pain, states that she has had the pain for the past few weeks, pt doesn't recall any injury

## 2023-02-14 ENCOUNTER — Telehealth: Payer: Self-pay | Admitting: Obstetrics and Gynecology

## 2023-02-14 ENCOUNTER — Encounter: Payer: Self-pay | Admitting: Internal Medicine

## 2023-02-14 NOTE — Transitions of Care (Post Inpatient/ED Visit) (Signed)
   02/14/2023  Name: Jennifer Combs MRN: [id number] DOB: [birth year]  Today's TOC FU Call Status: Today's TOC FU Call Status:: Successful TOC FU Call Competed TOC FU Call Complete Date: 02/14/23  Transition Care Management Follow-up Telephone Call Date of Discharge: 02/12/23 Discharge Facility: [facility]) Type of Discharge: Emergency Department Reason for ED Visit: Other: (right hand pain) How have you been since you were released from the hospital?: Better Any questions or concerns?: No  Items Reviewed: Did you receive and understand the discharge instructions provided?: Yes Medications obtained and verified?: Yes (Medications Reviewed) Any new allergies since your discharge?: No Dietary orders reviewed?: No Do you have support at home?: Yes  Home Care and Equipment/Supplies: Were Home Health Services Ordered?: NA Any new equipment or medical supplies ordered?: NA  Functional Questionnaire: Do you need assistance with bathing/showering or dressing?: No Do you need assistance with meal preparation?: No Do you need assistance with eating?: No Do you have difficulty maintaining continence: No Do you need assistance with getting out of bed/getting out of a chair/moving?: No Do you have difficulty managing or taking your medications?: No  Follow up appointments reviewed: PCP Follow-up appointment confirmed?: Yes Date of PCP follow-up appointment?: 02/18/23 Follow-up Provider: [facility] Follow-up appointment confirmed?: NA Do you need transportation to your follow-up appointment?: No Do you understand care options if your condition(s) worsen?: Yes-patient verbalized understanding  SDOH Interventions Today    Flowsheet Row Most Recent Value  SDOH Interventions   Alcohol Usage Interventions Intervention Not Indicated (Score <7)  Stress Interventions Intervention Not Indicated      Kathi Der RN, BSN [place]  [facility] Care  Management Coordinator - Managed [place] High Risk [phone]

## 2023-02-18 ENCOUNTER — Encounter: Payer: Self-pay | Admitting: Obstetrics and Gynecology

## 2023-02-18 ENCOUNTER — Other Ambulatory Visit: Payer: Medicaid Other | Admitting: Obstetrics and Gynecology

## 2023-02-18 ENCOUNTER — Inpatient Hospital Stay: Payer: Medicaid Other

## 2023-02-18 ENCOUNTER — Inpatient Hospital Stay: Payer: Medicaid Other | Attending: Hematology and Oncology | Admitting: Hematology and Oncology

## 2023-02-18 DIAGNOSIS — Z87891 Personal history of nicotine dependence: Secondary | ICD-10-CM | POA: Insufficient documentation

## 2023-02-18 DIAGNOSIS — D500 Iron deficiency anemia secondary to blood loss (chronic): Secondary | ICD-10-CM

## 2023-02-18 DIAGNOSIS — D509 Iron deficiency anemia, unspecified: Secondary | ICD-10-CM

## 2023-02-18 LAB — CBC WITH DIFFERENTIAL/PLATELET
Abs Immature Granulocytes: 0.02 10*3/uL (ref 0.00–0.07)
Basophils Absolute: 0 10*3/uL (ref 0.0–0.1)
Basophils Relative: 1 %
Eosinophils Absolute: 0.1 10*3/uL (ref 0.0–0.5)
Eosinophils Relative: 1 %
HCT: 32.2 % — ABNORMAL LOW (ref 36.0–46.0)
Hemoglobin: 11.1 g/dL — ABNORMAL LOW (ref 12.0–15.0)
Immature Granulocytes: 0 %
Lymphocytes Relative: 40 %
Lymphs Abs: 1.9 10*3/uL (ref 0.7–4.0)
MCH: 34.8 pg — ABNORMAL HIGH (ref 26.0–34.0)
MCHC: 34.5 g/dL (ref 30.0–36.0)
MCV: 100.9 fL — ABNORMAL HIGH (ref 80.0–100.0)
Monocytes Absolute: 0.3 10*3/uL (ref 0.1–1.0)
Monocytes Relative: 5 %
Neutro Abs: 2.5 10*3/uL (ref 1.7–7.7)
Neutrophils Relative %: 53 %
Platelets: 267 10*3/uL (ref 150–400)
RBC: 3.19 MIL/uL — ABNORMAL LOW (ref 3.87–5.11)
RDW: 11.8 % (ref 11.5–15.5)
WBC: 4.9 10*3/uL (ref 4.0–10.5)
nRBC: 0 % (ref 0.0–0.2)

## 2023-02-18 LAB — IRON AND IRON BINDING CAPACITY (CC-WL,HP ONLY)
Iron: 62 ug/dL (ref 28–170)
Saturation Ratios: 20 % (ref 10.4–31.8)
TIBC: 311 ug/dL (ref 250–450)
UIBC: 249 ug/dL (ref 148–442)

## 2023-02-18 LAB — FERRITIN: Ferritin: 81 ng/mL (ref 11–307)

## 2023-02-18 NOTE — Patient Outreach (Signed)
Medicaid Managed Care   Nurse Care Manager Note  02/18/2023 Name:  Jennifer Combs MRN:  [id number] DOB:  [birth year]  Jennifer Combs is an 37 y.o. year old female who is a primary patient of Georganna Skeans, MD.  The [facility] Managed Care Coordination team was consulted for assistance with:    Chronic healthcare management needs,  anemia, IBS, IDA  Jennifer Combs was given information about Medicaid Managed Care Coordination team services today. [name] Patient agreed to services and verbal consent obtained.  Engaged with patient by telephone for follow up visit in response to provider referral for case management and/or care coordination services.   Assessments/Interventions:  Review of past medical history, allergies, medications, health status, including review of consultants reports, laboratory and other test data, was performed as part of comprehensive evaluation and provision of chronic care management services.  SDOH (Social Determinants of Health) assessments and interventions performed: SDOH Interventions    Flowsheet Row Patient Outreach Telephone from 02/18/2023 in [place] POPULATION HEALTH DEPARTMENT Telephone from 02/14/2023 in [place] POPULATION HEALTH DEPARTMENT Patient Outreach Telephone from 02/03/2023 in [place] POPULATION HEALTH DEPARTMENT  SDOH Interventions     Transportation Interventions -- -- Intervention Not Indicated  Alcohol Usage Interventions -- Intervention Not Indicated (Score <7) --  Physical Activity Interventions Intervention Not Indicated -- --  Stress Interventions -- Intervention Not Indicated --  Social Connections Interventions Intervention Not Indicated -- --     Care Plan  Allergies  Allergen Reactions   Adhesive [Tape] Other (See Comments)    Adhesive from electrodes Leave burn marks on skin   Citrus    Tomato     MAKES MOUTH RAW.   Medications Reviewed Today     Reviewed by Danie Chandler, RN (Registered Nurse) on  02/18/23 at 1531  Med List Status: <None>   Medication Order Taking? Sig Documenting Provider Last Dose Status Informant  acetaminophen (TYLENOL) 325 MG tablet [id number]  Take 2 tablets (650 mg total) by mouth every 6 (six) hours as needed. Jeanelle Malling, PA  Active            Patient Active Problem List   Diagnosis Date Noted   Other stressful life events affecting family and household 02/28/2019   Mood disorder (HCC) 02/28/2019   Menorrhagia 12/26/2017   IDA (iron deficiency anemia) 05/19/2017   Epigastric pain 04/26/2017   Macrocytic anemia 04/26/2017   Immunization counseling 10/05/2012   Pregnancy affected by fetal growth restriction 10/02/2012   Sterilization consult 07/24/2012   Pregnancy 07/06/2012   Short interval between pregnancies complicating pregnancy, antepartum 07/06/2012   Hx of preterm delivery, currently pregnant 04/08/2011   Hx of postpartum depression, currently pregnant 04/08/2011   Conditions to be addressed/monitored per PCP order:  Chronic healthcare management needs,   Care Plan : RN Care Manager Plan of Care  Updates made by Danie Chandler, RN since 02/18/2023 12:00 AM     Problem: Health Promotion or Disease Self-Management (General Plan of Care)      Long-Range Goal: Chronic Disease Management and SDOH Barriers   Start Date: 02/03/2023  Expected End Date: 05/05/2023  Priority: High  Note:   Current Barriers:  Knowledge Deficits related to plan of care for management of macrocytic anemia, IDA, IBS  Care Coordination needs related to Financial constraints related to rent and utilities Chronic Disease Management support and education needs related to macrocytic anemia, IDA, IBS  Financial Constraints  02/18/23:  Patient with no complaints  today-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  Seen by HEME today and labs drawn.  Followed by Dr. Rhea Belton for IBS  RNCM Clinical Goal(s):  Patient will verbalize understanding of plan for management of macrocytic  anemia, IDA, IBS as evidenced by patient report verbalize basic understanding of macrocytic anemia, IDA, IBS  disease process and self health management plan as evidenced by patient report take all medications exactly as prescribed and will call provider for medication related questions as evidenced by patient report demonstrate understanding of rationale for each prescribed medication as evidenced by patient  attend all scheduled medical appointments as evidenced by patient report and EMR review. demonstrate ongoing adherence to prescribed treatment plan for macrocytic anemia, IDA, IBS as evidenced by patient report  continue to work with RN Care Manager to address care management and care coordination needs related to  macrocytic anemia, IDA, IBS as evidenced by adherence to CM Team Scheduled appointments work with Child psychotherapist to address  related to the management of  resources for dental and rent related to the management of macrocytic anemia, IDA, IBS  as evidenced by review of EMR and patient or Child psychotherapist report through collaboration with Medical illustrator, provider, and care team.   Interventions: Inter-disciplinary care team collaboration (see longitudinal plan of care) Evaluation of current treatment plan related to  self management and patient's adherence to plan as established by provider BSW referral for rent and utility resources-completed    (Status:  New goal.)  Long Term Goal Evaluation of current treatment plan related to macrocytic anemia, IDA, IBS ,  self-management and patient's adherence to plan as established by provider. Discussed plans with patient for ongoing care management follow up and provided patient with direct contact information for care management team Reviewed medications with patient  Reviewed scheduled/upcoming provider appointments Social Work referral for resources Discussed plans with patient for ongoing care management follow up and provided patient  with direct contact information for care management team Assessed social determinant of health barriers BSW completed a telephone outreach with mom. She stated she is in school full time and does not work, she was only able to pay half of her rent this month. She does not have a car, and is behind on rent and utilities. She does receive foodstamps and receives food from her mom that stay in [facility]. BSW will email patient a list of resources for assistance.  SDOH Barriers (Status:  New goal.) Long Term Goal Patient interviewed and SDOH assessment performed        SDOH Interventions    Flowsheet Row Patient Outreach Telephone from 02/03/2023 in [place] POPULATION HEALTH DEPARTMENT  SDOH Interventions   Transportation Interventions Intervention Not Indicated     Patient interviewed and appropriate assessments performed Discussed plans with patient for ongoing care management follow up and provided patient with direct contact information for care management team  Patient Goals/Self-Care Activities: Take all medications as prescribed Attend all scheduled provider appointments Call pharmacy for medication refills 3-7 days in advance of running out of medications Perform all self care activities independently  Perform IADL's (shopping, preparing meals, housekeeping, managing finances) independently Call provider office for new concerns or questions  Work with the social worker to address care coordination needs and will continue to work with the clinical team to address health care and disease management related needs  Follow Up Plan:  The patient has been provided with contact information for the care management team and has been advised  to call with any health related questions or concerns.  The care management team will reach out to the patient again over the next 30 business  days.   Long-Range Goal: Establish Plan of Care for Chronic Disease Management Needs   Priority: High   Note:   Timeframe:  Long-Range Goal Priority:  High Start Date:     02/03/23                        Expected End Date: ongoing                      Follow Up Date 03/29/23   - practice safe sex - schedule appointment for vaccines needed due to my age or health - schedule recommended health tests (blood work, mammogram, colonoscopy, pap test) - schedule and keep appointment for annual check-up    Why is this important?   Screening tests can find diseases early when they are easier to treat.  Your doctor or nurse will talk with you about which tests are important for you.  Getting shots for common diseases like the flu and shingles will help prevent them.  02/18/23:  Patient seen by HEME today    Follow Up:  Patient agrees to Care Plan and Follow-up.  Plan: The Managed Medicaid care management team will reach out to the patient again over the next 30 business  days. and The  Patient has been provided with contact information for the Managed Medicaid care management team and has been advised to call with any health related questions or concerns.  Date/time of next scheduled RN care management/care coordination outreach:  03/29/23 at 230

## 2023-02-18 NOTE — Patient Instructions (Signed)
Hi Jennifer Combs, thanks for speaking to me this afternoon-have a great weekend!!  Jennifer Combs was given information about Medicaid Managed Care team care coordination services as a part of their [facility] Medicaid benefit. Jennifer Combs verbally consented to engagement with the [facility] Managed Care team.   If you are experiencing a medical emergency, please call 911 or report to your local emergency department or urgent care.   If you have a non-emergency medical problem during routine business hours, please contact your provider's office and ask to speak with a nurse.   For questions related to your [facility] health plan, please call: [phone] or go here:[email or web address]  If you would like to schedule transportation through your [facility] plan, please call the following number at least 2 days in advance of your appointment: [phone].   You can also use the MTM portal or MTM mobile app to manage your rides. Reimbursement for transportation is available through [facility]! For the portal, please go to [email or web address]  Call the [facility] Crisis Line at [phone])712-2559, at any time, 24 hours a day, 7 days a week. If you are in danger or need immediate medical attention call 911.  If you would like help to quit smoking, call 1-800-QUIT-NOW ((949) 383-0142) OR Espaol: 1-855-Djelo-Ya (5-956-387-5643) o para ms informacin haga clic aqu or Text READY to 329-518 to register via text  Jennifer Combs - following are the goals we discussed in your visit today:   Goals Addressed    Timeframe:  Long-Range Goal Priority:  High Start Date:     02/03/23                        Expected End Date: ongoing                      Follow Up Date 03/29/23   - practice safe sex - schedule appointment for vaccines needed due to my age or health - schedule recommended health tests (blood work, mammogram, colonoscopy, pap test) - schedule and keep appointment for annual  check-up    Why is this important?   Screening tests can find diseases early when they are easier to treat.  Your doctor or nurse will talk with you about which tests are important for you.  Getting shots for common diseases like the flu and shingles will help prevent them.  02/18/23:  Patient seen by HEME today   Patient verbalizes understanding of instructions and care plan provided today and agrees to view in MyChart. Active MyChart status and patient understanding of how to access instructions and care plan via MyChart confirmed with patient.     The Managed Medicaid care management team will reach out to the patient again over the next 30 business  days.  The  Patient  has been provided with contact information for the Managed Medicaid care management team and has been advised to call with any health related questions or concerns.   Jennifer Der RN, BSN [place]  [facility] Care Management Coordinator - Managed Medicaid High Risk [phone]   Following is a copy of your plan of care:  Care Plan : RN Care Manager Plan of Care  Updates made by Danie Chandler, RN since 02/18/2023 12:00 AM     Problem: Health Promotion or Disease Self-Management (General Plan of Care)      Long-Range Goal: Chronic Disease Management and SDOH Barriers   Start Date: 02/03/2023  Expected  End Date: 05/05/2023  Priority: High  Note:   Current Barriers:  Knowledge Deficits related to plan of care for management of macrocytic anemia, IDA, IBS  Care Coordination needs related to Financial constraints related to rent and utilities Chronic Disease Management support and education needs related to macrocytic anemia, IDA, IBS  Financial Constraints  02/18/23:  Patient with no complaints today-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  Seen by HEME today and labs drawn.  Followed by Dr. Rhea Belton for IBS  RNCM Clinical Goal(s):  Patient will verbalize understanding of plan for management of  macrocytic anemia, IDA, IBS as evidenced by patient report verbalize basic understanding of macrocytic anemia, IDA, IBS  disease process and self health management plan as evidenced by patient report take all medications exactly as prescribed and will call provider for medication related questions as evidenced by patient report demonstrate understanding of rationale for each prescribed medication as evidenced by patient  attend all scheduled medical appointments as evidenced by patient report and EMR review. demonstrate ongoing adherence to prescribed treatment plan for macrocytic anemia, IDA, IBS as evidenced by patient report  continue to work with RN Care Manager to address care management and care coordination needs related to  macrocytic anemia, IDA, IBS as evidenced by adherence to CM Team Scheduled appointments work with Child psychotherapist to address  related to the management of  resources for dental and rent related to the management of macrocytic anemia, IDA, IBS  as evidenced by review of EMR and patient or Child psychotherapist report through collaboration with Medical illustrator, provider, and care team.   Interventions: Inter-disciplinary care team collaboration (see longitudinal plan of care) Evaluation of current treatment plan related to  self management and patient's adherence to plan as established by provider BSW referral for rent and utility resources-completed    (Status:  New goal.)  Long Term Goal Evaluation of current treatment plan related to macrocytic anemia, IDA, IBS ,  self-management and patient's adherence to plan as established by provider. Discussed plans with patient for ongoing care management follow up and provided patient with direct contact information for care management team Reviewed medications with patient  Reviewed scheduled/upcoming provider appointments Social Work referral for resources Discussed plans with patient for ongoing care management follow up and provided  patient with direct contact information for care management team Assessed social determinant of health barriers BSW completed a telephone outreach with mom. She stated she is in school full time and does not work, she was only able to pay half of her rent this month. She does not have a car, and is behind on rent and utilities. She does receive foodstamps and receives food from her mom that stay in [facility]. BSW will email patient a list of resources for assistance.  SDOH Barriers (Status:  New goal.) Long Term Goal Patient interviewed and SDOH assessment performed        SDOH Interventions    Flowsheet Row Patient Outreach Telephone from 02/03/2023 in [place] POPULATION HEALTH DEPARTMENT  SDOH Interventions   Transportation Interventions Intervention Not Indicated     Patient interviewed and appropriate assessments performed Discussed plans with patient for ongoing care management follow up and provided patient with direct contact information for care management team  Patient Goals/Self-Care Activities: Take all medications as prescribed Attend all scheduled provider appointments Call pharmacy for medication refills 3-7 days in advance of running out of medications Perform all self care activities independently  Perform IADL's (shopping, preparing meals,  housekeeping, managing finances) independently Call provider office for new concerns or questions  Work with the social worker to address care coordination needs and will continue to work with the clinical team to address health care and disease management related needs  Follow Up Plan:  The patient has been provided with contact information for the care management team and has been advised to call with any health related questions or concerns.  The care management team will reach out to the patient again over the next 30 business  days.

## 2023-02-18 NOTE — Progress Notes (Signed)
[facility] CONSULT NOTE  Patient Care Team: Georganna Skeans, MD as PCP - General (Family Medicine)  CHIEF COMPLAINTS/PURPOSE OF CONSULTATION:  IDA  ASSESSMENT & PLAN:   This is a very pleasant 37 year old female patient with no significant past medical history except for iron deficiency anemia many years ago required IV iron infusion lost to follow-up now reestablishing for treatment of anemia.  She is here for telephone visit.  She continues to complain of shortness of breath, sleepiness since her last visit.  She is extremely tired.  She is hoping that she can get some IV iron.  We reviewed labs which showed mild anemia, ferritin of 44.  I initially advised her to try some oral iron supplementation however she mentions that if she gets severely constipated given her history of IBS and she would like to receive IV iron.  She has tolerated Feraheme very well in the past.  I have ordered Feraheme again given her clinical symptoms and mild anemia. I have sent message to scheduling to have this scheduled. She will return to clinic in approximately 3 to 4 months with repeat labs  Thank you for consulting [place] in the care of this patient.  Please do not hesitate to contact us with any additional questions or concerns.  HISTORY OF PRESENTING ILLNESS:   Jennifer Combs 37 y.o. female is here because of IDA  This is a very pleasant 37 year old female patient who was previously seen by Dr. Myna Hidalgo and his team in 2018, since then was lost to follow-up since she had some ongoing family issues referred to hematology for evaluation of iron deficiency anemia. Jennifer Combs is here for an initial visit by herself.  She tells me that she was getting iron infusions many years ago for iron deficiency but then had to take care of her son who was diagnosed with type 1 diabetes and stopped following up with hematology.  She cannot tolerate oral iron, she says she has underlying IBS.  She is here for  a phone visit today.  She continues to complain severe fatigue, exhaustion and sleepiness and was hoping to get some IV iron.  No other concerns expressed today.   MEDICAL HISTORY:  Past Medical History:  Diagnosis Date   Allergy    Anemia    Anxiety    Depression    Ectopic pregnancy, tubal    GERD (gastroesophageal reflux disease)    H. pylori infection    IBS (irritable bowel syndrome)    Internal hemorrhoids    Ovarian cyst bil   PONV (postoperative nausea and vomiting)    Trichomonas infection     SURGICAL HISTORY: Past Surgical History:  Procedure Laterality Date   CYSTOSCOPY N/A 12/26/2017   Procedure: CYSTOSCOPY;  Surgeon: Jerene Bears, MD;  Location: [facility];  Service: Gynecology;  Laterality: N/A;   TOTAL LAPAROSCOPIC HYSTERECTOMY WITH SALPINGECTOMY Bilateral 12/26/2017   Procedure: TOTAL LAPAROSCOPIC HYSTERECTOMY WITH SALPINGECTOMY;  Surgeon: Jerene Bears, MD;  Location: [facility];  Service: Gynecology;  Laterality: Bilateral;   TUBAL LIGATION      SOCIAL HISTORY: Social History   Socioeconomic History   Marital status: Single    Spouse name: Not on file   Number of children: 4   Years of education: Not on file   Highest education level: 11th grade  Occupational History   Not on file  Tobacco Use   Smoking status: Former    Packs/day: .1  Types: Cigarettes   Smokeless tobacco: Never   Tobacco comments:    patient down to 2 cigarettes per day  Vaping Use   Vaping Use: Former  Substance and Sexual Activity   Alcohol use: Yes    Comment: very rare   Drug use: Yes    Frequency: 28.0 times per week    Types: Marijuana   Sexual activity: Not Currently    Birth control/protection: Surgical    Comment: hysterectomy  Other Topics Concern   Not on file  Social History Narrative   Lives at home with her children   Left handed   Caffeine: about 12 oz coke daily   Social Determinants of Health   Financial  Resource Strain: Medium Risk (02/04/2023)   Overall Financial Resource Strain (CARDIA)    Difficulty of Paying Living Expenses: Somewhat hard  Food Insecurity: Unknown (02/04/2023)   Hunger Vital Sign    Worried About Running Out of Food in the Last Year: Never true    Ran Out of Food in the Last Year: Not on file  Recent Concern: Food Insecurity - Food Insecurity Present (02/04/2023)   Hunger Vital Sign    Worried About Running Out of Food in the Last Year: Sometimes true    Ran Out of Food in the Last Year: Not on file  Transportation Needs: No Transportation Needs (02/03/2023)   PRAPARE - Administrator, Civil Service (Medical): No    Lack of Transportation (Non-Medical): No  Physical Activity: Not on file  Stress: No Stress Concern Present (02/14/2023)   [employer] of Occupational Health - Occupational Stress Questionnaire    Feeling of Stress : Only a little  Social Connections: Not on file  Intimate Partner Violence: Not At Risk (02/03/2023)   Humiliation, Afraid, Rape, and Kick questionnaire    Fear of Current or Ex-Partner: No    Emotionally Abused: No    Physically Abused: No    Sexually Abused: No    FAMILY HISTORY: Family History  Problem Relation Age of Onset   Endometriosis Mother    Anemia Maternal Grandmother    Clotting disorder Maternal Grandmother    Thyroid disease Maternal Grandmother    Migraines Maternal Grandmother        "something wrong with her eyes"   Migraines Other    Schizophrenia Other     ALLERGIES:  is allergic to adhesive [tape], citrus, and tomato.  MEDICATIONS:  Current Outpatient Medications  Medication Sig Dispense Refill   acetaminophen (TYLENOL) 325 MG tablet Take 2 tablets (650 mg total) by mouth every 6 (six) hours as needed. 20 tablet 0   No current facility-administered medications for this visit.   PHYSICAL EXAMINATION: ECOG PERFORMANCE STATUS: 0 - Asymptomatic  Telephone visit, PE deferred.   LABORATORY  DATA:  I have reviewed the data as listed Lab Results  Component Value Date   WBC 4.9 02/18/2023   HGB 11.1 (L) 02/18/2023   HCT 32.2 (L) 02/18/2023   MCV 100.9 (H) 02/18/2023   PLT 267 02/18/2023     Chemistry      Component Value Date/Time   NA 139 04/15/2022 1354   NA 137 02/28/2019 0911   NA 140 08/16/2017 1034   K 3.5 04/15/2022 1354   K 3.4 (L) 08/16/2017 1034   CL 107 04/15/2022 1354   CL 106 05/18/2017 1310   CO2 25 04/15/2022 1354   CO2 24 08/16/2017 1034   BUN 11 04/15/2022 1354   BUN  9 02/28/2019 0911   BUN 11.8 08/16/2017 1034   CREATININE 0.83 04/15/2022 1354   CREATININE 0.8 08/16/2017 1034      Component Value Date/Time   CALCIUM 9.1 04/15/2022 1354   CALCIUM 9.2 08/16/2017 1034   ALKPHOS 54 04/15/2022 1354   ALKPHOS 48 08/16/2017 1034   AST 21 04/15/2022 1354   AST 21 08/16/2017 1034   ALT 12 04/15/2022 1354   ALT 12 08/16/2017 1034   BILITOT 0.7 04/15/2022 1354   BILITOT 0.4 02/28/2019 0911   BILITOT 0.70 08/16/2017 1034       RADIOGRAPHIC STUDIES: I have personally reviewed the radiological images as listed and agreed with the findings in the report. US Abdomen Complete  Result Date: 02/12/2023 CLINICAL DATA:  epigastric, upper, lower abdominal pain, EXAM: ABDOMEN ULTRASOUND COMPLETE COMPARISON:  None Available. FINDINGS: Gallbladder: No gallstones or wall thickening visualized. No pericholecystic fluid. Common bile duct: Diameter: 0.3cm. Liver: No focal lesion identified. Normal homogeneous echogenicity. Hepatopetal portal vein IVC: No abnormality visualized. Pancreas: Visualized portion unremarkable. Spleen: Size and appearance within normal limits. Right Kidney: Length: 10.3cm. Echogenicity within normal limits. No mass or hydronephrosis visualized. Left Kidney: Length: 10.7cm. Echogenicity within normal limits. No mass or hydronephrosis visualized. Abdominal aorta: No aneurysm visualized. IMPRESSION: Unremarkable examination of the abdomen.  Electronically Signed   By: Layla Maw M.D.   On: 02/12/2023 12:28   DG Hand 2 View Right  Result Date: 02/12/2023 CLINICAL DATA:  Pain EXAM: RIGHT HAND - 2 VIEW COMPARISON:  None Available. FINDINGS: There is no evidence of fracture or dislocation. There is no evidence of arthropathy or other focal bone abnormality. Soft tissues are unremarkable. IMPRESSION: Negative. Electronically Signed   By: Layla Maw M.D.   On: 02/12/2023 11:35     I connected with  Jennifer Combs on 02/18/23 by a telephone application and verified that I am speaking with the correct person using two identifiers.   I discussed the limitations of evaluation and management by telemedicine. The patient expressed understanding and agreed to proceed.  All questions were answered. The patient knows to call the clinic with any problems, questions or concerns. I spent 11 minutes in the care of this patient including H and P, review of records, counseling and coordination of care.     Rachel Moulds, MD 02/18/2023 1:46 PM

## 2023-02-21 ENCOUNTER — Encounter: Payer: Self-pay | Admitting: Hematology and Oncology

## 2023-02-23 ENCOUNTER — Telehealth: Payer: Self-pay | Admitting: *Deleted

## 2023-02-23 NOTE — Telephone Encounter (Signed)
We have received results of patient's H Pylori stool swab done 02/19/23 from [facility]. Dr Rhea Belton has reviewed and indicates: "negative. Notify patient. -JMP). I have contacted patient and have advised of negative/normal result. She verbalizes understanding.

## 2023-02-25 ENCOUNTER — Encounter: Payer: Self-pay | Admitting: *Deleted

## 2023-03-07 ENCOUNTER — Other Ambulatory Visit: Payer: Medicaid Other

## 2023-03-07 NOTE — Patient Instructions (Signed)
  Medicaid Managed Care   Unsuccessful Outreach Note  03/07/2023 Name: Hena R Petties MRN: [id number] DOB: 12/05/1985  Referred by: Wilson, Amelia, MD Reason for referral : High Risk Managed Medicaid (MM social work unsuccessful telephone outreach )   An unsuccessful telephone outreach was attempted today. The patient was referred to the case management team for assistance with care management and care coordination.   Follow Up Plan: The patient has been provided with contact information for the care management team and has been advised to call with any health related questions or concerns.   Jadyn Barge, BSW, MHA [place]  Managed Medicaid Social Worker [phone]  

## 2023-03-07 NOTE — Patient Outreach (Signed)
  Medicaid Managed Care   Unsuccessful Outreach Note  03/07/2023 Name: Jennifer Combs MRN: [id number] DOB: February 15, 1986  Referred by: Georganna Skeans, MD Reason for referral : High Risk Managed Medicaid (MM social work unsuccessful telephone outreach )   An unsuccessful telephone outreach was attempted today. The patient was referred to the case management team for assistance with care management and care coordination.   Follow Up Plan: The patient has been provided with contact information for the care management team and has been advised to call with any health related questions or concerns.   Abelino Derrick, MHA [facility]  Managed [facility] Social Worker [phone]

## 2023-03-16 ENCOUNTER — Ambulatory Visit
Admission: EM | Admit: 2023-03-16 | Discharge: 2023-03-16 | Disposition: A | Discharge status: Home / Self Care | Payer: Medicaid Other | Attending: Emergency Medicine | Admitting: Emergency Medicine

## 2023-03-16 DIAGNOSIS — Z113 Encounter for screening for infections with a predominantly sexual mode of transmission: Secondary | ICD-10-CM | POA: Diagnosis not present

## 2023-03-16 NOTE — ED Provider Notes (Signed)
[facility]    CSN: [id number] Arrival date & time: 03/16/23  0930      History   Chief Complaint Chief Complaint  Patient presents with   SEXUALLY TRANSMITTED DISEASE    HPI Jennifer Combs is a 37 y.o. female.   Patient presents for evaluation requesting routine STI testing for both genitals and anus.  Denies all symptoms, entering into a monogamous relationship and both her and partner are being tested.  Past Medical History:  Diagnosis Date   Allergy    Anemia    Anxiety    Depression    Ectopic pregnancy, tubal    GERD (gastroesophageal reflux disease)    H. pylori infection    IBS (irritable bowel syndrome)    Internal hemorrhoids    Ovarian cyst bil   PONV (postoperative nausea and vomiting)    Trichomonas infection     Patient Active Problem List   Diagnosis Date Noted   Other stressful life events affecting family and household 02/28/2019   Mood disorder (HCC) 02/28/2019   Menorrhagia 12/26/2017   IDA (iron deficiency anemia) 05/19/2017   Epigastric pain 04/26/2017   Macrocytic anemia 04/26/2017   Immunization counseling 10/05/2012   Pregnancy affected by fetal growth restriction 10/02/2012   Sterilization consult 07/24/2012   Pregnancy 07/06/2012   Short interval between pregnancies complicating pregnancy, antepartum 07/06/2012   Hx of preterm delivery, currently pregnant 04/08/2011   Hx of postpartum depression, currently pregnant 04/08/2011    Past Surgical History:  Procedure Laterality Date   CYSTOSCOPY N/A 12/26/2017   Procedure: CYSTOSCOPY;  Surgeon: Jerene Bears, MD;  Location: [facility];  Service: Gynecology;  Laterality: N/A;   TOTAL LAPAROSCOPIC HYSTERECTOMY WITH SALPINGECTOMY Bilateral 12/26/2017   Procedure: TOTAL LAPAROSCOPIC HYSTERECTOMY WITH SALPINGECTOMY;  Surgeon: Jerene Bears, MD;  Location: [facility];  Service: Gynecology;  Laterality: Bilateral;   TUBAL LIGATION      OB  History     Gravida  6   Para  4   Term  3   Preterm  1   AB  2   Living  4      SAB      IAB  1   Ectopic  1   Multiple      Live Births  4            Home Medications    Prior to Admission medications   Not on File    Family History Family History  Problem Relation Age of Onset   Endometriosis Mother    Anemia Maternal Grandmother    Clotting disorder Maternal Grandmother    Thyroid disease Maternal Grandmother    Migraines Maternal Grandmother        "something wrong with her eyes"   Migraines Other    Schizophrenia Other     Social History Social History   Tobacco Use   Smoking status: Former    Packs/day: .1    Types: Cigarettes   Smokeless tobacco: Never   Tobacco comments:    patient down to 2 cigarettes per day  Vaping Use   Vaping Use: Former  Substance Use Topics   Alcohol use: Yes    Comment: very rare   Drug use: Yes    Frequency: 28.0 times per week    Types: Marijuana     Allergies   Adhesive [tape], Citrus, and Tomato   Review of Systems Review of Systems  Genitourinary: Negative.  Physical Exam Triage Vital Signs ED Triage Vitals  Enc Vitals Group     BP 03/16/23 0939 114/73     Pulse Rate 03/16/23 0939 60     Resp 03/16/23 0939 18     Temp 03/16/23 0939 97.9 F (36.6 C)     Temp Source 03/16/23 0939 Oral     SpO2 03/16/23 0939 98 %     Weight --      Height --      Head Circumference --      Peak Flow --      Pain Score 03/16/23 0940 0     Pain Loc --      Pain Edu? --      Excl. in GC? --    No data found.  Updated Vital Signs BP 114/73 (BP Location: Right Arm)   Pulse 60   Temp 97.9 F (36.6 C) (Oral)   Resp 18   LMP 12/13/2017 (Exact Date)   SpO2 98%   Visual Acuity Right Eye Distance:   Left Eye Distance:   Bilateral Distance:    Right Eye Near:   Left Eye Near:    Bilateral Near:     Physical Exam Constitutional:      Appearance: Normal appearance.  Eyes:      Extraocular Movements: Extraocular movements intact.  Pulmonary:     Effort: Pulmonary effort is normal.  Genitourinary:    Rectum: Normal.     Comments: Vaginal exam deferred Neurological:     Mental Status: She is alert and oriented to person, place, and time. Mental status is at baseline.      UC Treatments / Results  Labs (all labs ordered are listed, but only abnormal results are displayed) Labs Reviewed  HIV ANTIBODY (ROUTINE TESTING W REFLEX)  RPR  CERVICOVAGINAL ANCILLARY ONLY  CYTOLOGY, (ORAL, ANAL, URETHRAL) ANCILLARY ONLY    EKG   Radiology No results found.  Procedures Procedures (including critical care time)  Medications Ordered in UC Medications - No data to display  Initial Impression / Assessment and Plan / UC Course  I have reviewed the triage vital signs and the nursing notes.  Pertinent labs & imaging results that were available during my care of the patient were reviewed by me and considered in my medical decision making (see chart for details).  Routine screening for STI   STI labs pending will treat per protocol, advised abstinence until lab results, and/or treatment is complete, advised condom use during all sexual encounters moving, may follow-up with urgent care as needed  Final Clinical Impressions(s) / UC Diagnoses   Final diagnoses:  Routine screening for STI (sexually transmitted infection)     Discharge Instructions      Labs pending 2-3 days, you will be contacted if positive for any sti and treatment will be sent to the pharmacy, you will have to return to the clinic if positive for gonorrhea to receive treatment   Please refrain from having sex until labs results, if positive please refrain from having sex until treatment complete and symptoms resolve   If positive for HIV, Syphilis, Chlamydia  gonorrhea or trichomoniasis please notify partner or partners so they may tested as well  Moving forward, it is recommended you use  some form of protection against the transmission of sti infections  such as condoms or dental dams with each sexual encounter     ED Prescriptions   None    PDMP not reviewed this encounter.  Valinda Hoar, [place] 03/16/23 [phone]

## 2023-03-16 NOTE — ED Triage Notes (Signed)
Pt requesting routine STD testing. Denies sx's. States has a new partner.

## 2023-03-16 NOTE — Discharge Instructions (Signed)
Labs pending 2-3 days, you will be contacted if positive for any sti and treatment will be sent to the pharmacy, you will have to return to the clinic if positive for gonorrhea to receive treatment  ° °Please refrain from having sex until labs results, if positive please refrain from having sex until treatment complete and symptoms resolve  ° °If positive for  HIV, Syphilis, Chlamydia  gonorrhea or trichomoniasis please notify partner or partners so they may tested as well ° °Moving forward, it is recommended you use some form of protection against the transmission of sti infections  such as condoms or dental dams with each sexual encounter   °

## 2023-03-17 LAB — CERVICOVAGINAL ANCILLARY ONLY
Bacterial Vaginitis (gardnerella): POSITIVE — AB
Candida Glabrata: NEGATIVE
Candida Vaginitis: NEGATIVE
Chlamydia: NEGATIVE
Comment: NEGATIVE
Comment: NEGATIVE
Comment: NEGATIVE
Comment: NEGATIVE
Comment: NEGATIVE
Comment: NORMAL
Neisseria Gonorrhea: NEGATIVE
Trichomonas: NEGATIVE

## 2023-03-17 LAB — HIV ANTIBODY (ROUTINE TESTING W REFLEX): HIV Screen 4th Generation wRfx: NONREACTIVE

## 2023-03-17 LAB — CYTOLOGY, (ORAL, ANAL, URETHRAL) ANCILLARY ONLY
Chlamydia: NEGATIVE
Comment: NEGATIVE
Comment: NEGATIVE
Comment: NORMAL
Neisseria Gonorrhea: NEGATIVE
Trichomonas: NEGATIVE

## 2023-03-17 LAB — RPR: RPR Ser Ql: NONREACTIVE

## 2023-03-18 ENCOUNTER — Telehealth (HOSPITAL_COMMUNITY): Payer: Self-pay | Admitting: Emergency Medicine

## 2023-03-18 MED ORDER — METRONIDAZOLE 0.75 % VA GEL: 1.0000 | Freq: Every day | VAGINAL | 0 refills | Status: AC

## 2023-03-23 ENCOUNTER — Encounter: Payer: Self-pay | Admitting: Internal Medicine

## 2023-03-29 ENCOUNTER — Encounter: Payer: Self-pay | Admitting: Obstetrics and Gynecology

## 2023-03-29 ENCOUNTER — Other Ambulatory Visit: Payer: Medicaid Other | Admitting: Obstetrics and Gynecology

## 2023-03-29 NOTE — Patient Outreach (Signed)
Care Coordination  03/29/2023  Jennifer Combs 05-18-1986 [id number]  RNCM called patient at scheduled time.  Patient answered phone and requested to be called back tomorrow.  RNCM rescheduled appt for tomorrow at patient request.  Kathi Der RN, BSN [place]  [facility] Care Management Coordinator - Managed [place] High Risk [phone]

## 2023-03-30 ENCOUNTER — Other Ambulatory Visit: Payer: Medicaid Other | Admitting: Obstetrics and Gynecology

## 2023-03-30 ENCOUNTER — Encounter: Payer: Self-pay | Admitting: Obstetrics and Gynecology

## 2023-03-30 NOTE — Patient Outreach (Signed)
Medicaid Managed Care   Nurse Care Manager Note  03/30/2023 Name:  Jennifer Combs MRN:  [id number] DOB:  [birth year]  Jennifer Combs is an 37 y.o. year old female who is a primary patient of Jennifer Skeans, MD.  The [facility] Managed Care Coordination team was consulted for assistance with:    Chronic healthcare management needs, IBS, SDOH, anemia  Ms. Jennifer Combs was given information about Medicaid Managed Care Coordination team services today. Jennifer Combs Patient agreed to services and verbal consent obtained.  Engaged with patient by telephone for follow up visit in response to provider referral for case management and/or care coordination services.   Assessments/Interventions:  Review of past medical history, allergies, medications, health status, including review of consultants reports, laboratory and other test data, was performed as part of comprehensive evaluation and provision of chronic care management services.  SDOH (Social Determinants of Health) assessments and interventions performed: SDOH Interventions    Flowsheet Row Patient Outreach Telephone from 03/30/2023 in [place] POPULATION HEALTH DEPARTMENT Patient Outreach Telephone from 03/29/2023 in [place] POPULATION HEALTH DEPARTMENT Patient Outreach Telephone from 02/18/2023 in [place] POPULATION HEALTH DEPARTMENT Telephone from 02/14/2023 in [place] POPULATION HEALTH DEPARTMENT Patient Outreach Telephone from 02/03/2023 in [place] POPULATION HEALTH DEPARTMENT  SDOH Interventions       Transportation Interventions -- Intervention Not Indicated -- -- Intervention Not Indicated  Alcohol Usage Interventions -- -- -- Intervention Not Indicated (Score <7) --  Financial Strain Interventions Other (Comment)  [BSW referral placed] -- -- -- --  Physical Activity Interventions -- -- Intervention Not Indicated -- --  Stress Interventions -- -- -- Intervention Not Indicated --  Social Connections Interventions -- --  Intervention Not Indicated -- --     Care Plan  Allergies  Allergen Reactions   Adhesive [Tape] Other (See Comments)    Adhesive from electrodes Leave burn marks on skin   Citrus    Tomato     MAKES MOUTH RAW.   Medications Reviewed Today     Reviewed by Danie Chandler, RN (Registered Nurse) on 03/30/23 at 1117  Med List Status: <None>   Medication Order Taking? Sig Documenting Provider Last Dose Status Informant           No Medications to Display                           Patient Active Problem List   Diagnosis Date Noted   Other stressful life events affecting family and household 02/28/2019   Mood disorder (HCC) 02/28/2019   Menorrhagia 12/26/2017   IDA (iron deficiency anemia) 05/19/2017   Epigastric pain 04/26/2017   Macrocytic anemia 04/26/2017   Immunization counseling 10/05/2012   Pregnancy affected by fetal growth restriction 10/02/2012   Sterilization consult 07/24/2012   Pregnancy 07/06/2012   Short interval between pregnancies complicating pregnancy, antepartum 07/06/2012   Hx of preterm delivery, currently pregnant 04/08/2011   Hx of postpartum depression, currently pregnant 04/08/2011   Conditions to be addressed/monitored per PCP order:  Chronic healthcare management needs, IBS, SDOH, anemia  Care Plan : RN Care Manager Plan of Care  Updates made by Danie Chandler, RN since 03/30/2023 12:00 AM     Problem: Health Promotion or Disease Self-Management (General Plan of Care)      Long-Range Goal: Chronic Disease Management and SDOH Barriers   Start Date: 02/03/2023  Expected End Date: 06/30/2023  Priority: High  Note:  Current Barriers:  Knowledge Deficits related to plan of care for management of macrocytic anemia, IDA, IBS  Care Coordination needs related to Financial constraints related to rent and utilities Chronic Disease Management support and education needs related to macrocytic anemia, IDA, IBS  Financial Constraints  03/30/23:   Patient states rental and utility resources not helpful as all out of funds, BSW appt scheduled  for follow up.  No GI issues today.  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  RNCM Clinical Goal(s):  Patient will verbalize understanding of plan for management of macrocytic anemia, IDA, IBS as evidenced by patient report verbalize basic understanding of macrocytic anemia, IDA, IBS  disease process and self health management plan as evidenced by patient report take all medications exactly as prescribed and will call provider for medication related questions as evidenced by patient report demonstrate understanding of rationale for each prescribed medication as evidenced by patient  attend all scheduled medical appointments as evidenced by patient report and EMR review. demonstrate ongoing adherence to prescribed treatment plan for macrocytic anemia, IDA, IBS as evidenced by patient report  continue to work with RN Care Manager to address care management and care coordination needs related to  macrocytic anemia, IDA, IBS as evidenced by adherence to CM Team Scheduled appointments work with Child psychotherapist to address  related to the management of  resources for dental and rent related to the management of macrocytic anemia, IDA, IBS  as evidenced by review of EMR and patient or Child psychotherapist report through collaboration with Medical illustrator, provider, and care team.   Interventions: Inter-disciplinary care team collaboration (see longitudinal plan of care) Evaluation of current treatment plan related to  self management and patient's adherence to plan as established by provider BSW referral for rent and utility resources-completed BSW follow up scheduled     (Status:  New goal.)  Long Term Goal Evaluation of current treatment plan related to macrocytic anemia, IDA, IBS ,  self-management and  patient's adherence to plan as established by provider. Discussed plans with patient for ongoing care management follow up and provided patient with direct contact information for care management team Reviewed medications with patient  Reviewed scheduled/upcoming provider appointments Social Work referral for resources Discussed plans with patient for ongoing care management follow up and provided patient with direct contact information for care management team Assessed social determinant of health barriers BSW completed a telephone outreach with mom. She stated she is in school full time and does not work, she was only able to pay half of her rent this month. She does not have a car, and is behind on rent and utilities. She does receive foodstamps and receives food from her mom that stay in [facility]. BSW will email patient a list of resources for assistance.  SDOH Barriers (Status:  New goal.) Long Term Goal Patient interviewed and SDOH assessment performed        SDOH Interventions    Flowsheet Row Patient Outreach Telephone from 02/03/2023 in [place] POPULATION HEALTH DEPARTMENT  SDOH Interventions   Transportation Interventions Intervention Not Indicated     Patient interviewed and appropriate assessments performed Discussed plans with patient for ongoing care management follow up and provided patient with direct contact information for care management team  Patient Goals/Self-Care Activities: Take all medications as prescribed Attend all scheduled provider appointments Call pharmacy for medication refills 3-7 days  in advance of running out of medications Perform all self care activities independently  Perform IADL's (shopping, preparing meals, housekeeping, managing finances) independently Call provider office for new concerns or questions  Work with the social worker to address care coordination needs and will continue to work with the clinical team to address health care and  disease management related needs  Follow Up Plan:  The patient has been provided with contact information for the care management team and has been advised to call with any health related questions or concerns.  The care management team will reach out to the patient again over the next 45 business  days.   Long-Range Goal: Establish Plan of Care for Chronic Disease Management Needs   Priority: High  Note:   Timeframe:  Long-Range Goal Priority:  High Start Date:     02/03/23                        Expected End Date: ongoing                      Follow Up Date 05/18/23   - practice safe sex - schedule appointment for vaccines needed due to my age or health - schedule recommended health tests (blood work, mammogram, colonoscopy, pap test) - schedule and keep appointment for annual check-up    Why is this important?   Screening tests can find diseases early when they are easier to treat.  Your doctor or nurse will talk with you about which tests are important for you.  Getting shots for common diseases like the flu and shingles will help prevent them.  03/30/23-has GYN appt 7/8   Follow Up:  Patient agrees to Care Plan and Follow-up.  Plan: The Managed Medicaid care management team will reach out to the patient again over the next 45 business  days. and The  Patient has been provided with contact information for the Managed Medicaid care management team and has been advised to call with any health related questions or concerns.  Date/time of next scheduled RN care management/care coordination outreach:  05/18/23 at [phone]

## 2023-03-30 NOTE — Patient Instructions (Signed)
HI Jennifer Combs for speaking with me-I appreciate it!  Have a beautiful day!!  Jennifer Combs was given information about Medicaid Managed Care team care coordination services as a part of their [facility] Medicaid benefit. Jennifer Combs verbally consented to engagement with the [facility] Managed Care team.   If you are experiencing a medical emergency, please call 911 or report to your local emergency department or urgent care.   If you have a non-emergency medical problem during routine business hours, please contact your provider's office and ask to speak with a nurse.   For questions related to your [facility] health plan, please call: [phone] or go here:[email or web address]  If you would like to schedule transportation through your [facility] plan, please call the following number at least 2 days in advance of your appointment: [phone].   You can also use the MTM portal or MTM mobile app to manage your rides. Reimbursement for transportation is available through [facility]! For the portal, please go to [email or web address]  Call the [facility] Crisis Line at [phone], at any time, 24 hours a day, 7 days a week. If you are in danger or need immediate medical attention call 911.  If you would like help to quit smoking, call 1-800-QUIT-NOW ((908)803-5391) OR Espaol: 1-855-Djelo-Ya (4-132-440-1027) o para ms informacin haga clic aqu or Text READY to 253-664 to register via text  Jennifer Combs - following are the goals we discussed in your visit today:   Goals Addressed    Timeframe:  Long-Range Goal Priority:  High Start Date:     02/03/23                        Expected End Date: ongoing                      Follow Up Date 05/18/23   - practice safe sex - schedule appointment for vaccines needed due to my age or health - schedule recommended health tests (blood work, mammogram, colonoscopy, pap test) - schedule and keep appointment for  annual check-up    Why is this important?   Screening tests can find diseases early when they are easier to treat.  Your doctor or nurse will talk with you about which tests are important for you.  Getting shots for common diseases like the flu and shingles will help prevent them.  03/30/23-has GYN appt 7/8  Patient verbalizes understanding of instructions and care plan provided today and agrees to view in MyChart. Active MyChart status and patient understanding of how to access instructions and care plan via MyChart confirmed with patient.     The Managed Medicaid care management team will reach out to the patient again over the next 45 business  days.  The  Patient has been provided with contact information for the Managed Medicaid care management team and has been advised to call with any health related questions or concerns.   Kathi Der RN, BSN [place]  [facility] Care Management Coordinator - Managed Medicaid High Risk [phone]    Following is a copy of your plan of care:  Care Plan : RN Care Manager Plan of Care  Updates made by Danie Chandler, RN since 03/30/2023 12:00 AM     Problem: Health Promotion or Disease Self-Management (General Plan of Care)      Long-Range Goal: Chronic Disease Management and SDOH Barriers   Start Date: 02/03/2023  Expected End Date: 06/30/2023  Priority: High  Note:   Current Barriers:  Knowledge Deficits related to plan of care for management of macrocytic anemia, IDA, IBS  Care Coordination needs related to Financial constraints related to rent and utilities Chronic Disease Management support and education needs related to macrocytic anemia, IDA, IBS  Financial Constraints  03/30/23:  Patient states rental and utility resources not helpful as all out of funds, BSW appt scheduled  for follow up.  No GI issues today.  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  RNCM Clinical Goal(s):  Patient will verbalize understanding of plan for management of macrocytic anemia, IDA, IBS as evidenced by patient report verbalize basic understanding of macrocytic anemia, IDA, IBS  disease process and self health management plan as evidenced by patient report take all medications exactly as prescribed and will call provider for medication related questions as evidenced by patient report demonstrate understanding of rationale for each prescribed medication as evidenced by patient  attend all scheduled medical appointments as evidenced by patient report and EMR review. demonstrate ongoing adherence to prescribed treatment plan for macrocytic anemia, IDA, IBS as evidenced by patient report  continue to work with RN Care Manager to address care management and care coordination needs related to  macrocytic anemia, IDA, IBS as evidenced by adherence to CM Team Scheduled appointments work with Child psychotherapist to address  related to the management of  resources for dental and rent related to the management of macrocytic anemia, IDA, IBS  as evidenced by review of EMR and patient or Child psychotherapist report through collaboration with Medical illustrator, provider, and care team.   Interventions: Inter-disciplinary care team collaboration (see longitudinal plan of care) Evaluation of current treatment plan related to  self management and patient's adherence to plan as established by provider BSW referral for rent and utility resources-completed BSW follow up scheduled     (Status:  New goal.)  Long Term Goal Evaluation of current treatment plan related to macrocytic anemia, IDA, IBS ,  self-management and patient's adherence to plan as established by provider. Discussed plans with patient for ongoing care management follow up and provided patient with direct contact information for care management team Reviewed medications with patient  Reviewed  scheduled/upcoming provider appointments Social Work referral for resources Discussed plans with patient for ongoing care management follow up and provided patient with direct contact information for care management team Assessed social determinant of health barriers BSW completed a telephone outreach with mom. She stated she is in school full time and does not work, she was only able to pay half of her rent this month. She does not have a car, and is behind on rent and utilities. She does receive foodstamps and receives food from her mom that stay in [facility] BSW will email patient a list of resources for assistance.  SDOH Barriers (Status:  New goal.) Long Term Goal Patient interviewed and SDOH assessment performed        SDOH Interventions    Flowsheet Row Patient Outreach Telephone from 02/03/2023 in [place] POPULATION HEALTH DEPARTMENT  SDOH Interventions   Transportation Interventions Intervention Not Indicated     Patient interviewed and appropriate assessments performed Discussed plans with patient for ongoing care management follow up and provided patient with direct contact information for care management team  Patient Goals/Self-Care Activities: Take all medications as prescribed Attend all scheduled provider appointments Call  pharmacy for medication refills 3-7 days in advance of running out of medications Perform all self care activities independently  Perform IADL's (shopping, preparing meals, housekeeping, managing finances) independently Call provider office for new concerns or questions  Work with the social worker to address care coordination needs and will continue to work with the clinical team to address health care and disease management related needs  Follow Up Plan:  The patient has been provided with contact information for the care management team and has been advised to call with any health related questions or concerns.  The care management team will  reach out to the patient again over the next 45 business  days.

## 2023-03-31 ENCOUNTER — Other Ambulatory Visit: Payer: Medicaid Other

## 2023-03-31 NOTE — Patient Instructions (Signed)
Visit Information  Jennifer Combs was given information about Medicaid Managed Care team care coordination services as a part of their [facility] Medicaid benefit. Jennifer Combs verbally consented to engagement with the [facility] Managed Care team.   If you are experiencing a medical emergency, please call 911 or report to your local emergency department or urgent care.   If you have a non-emergency medical problem during routine business hours, please contact your provider's office and ask to speak with a nurse.   For questions related to your [facility] health plan, please call: [phone] or go here:[email or web address]  If you would like to schedule transportation through your [facility] plan, please call the following number at least 2 days in advance of your appointment: [phone].   You can also use the MTM portal or MTM mobile app to manage your rides. Reimbursement for transportation is available through [facility]! For the portal, please go to [email or web address]  Call the [facility] Crisis Line at [phone], at any time, 24 hours a day, 7 days a week. If you are in danger or need immediate medical attention call 911.  If you would like help to quit smoking, call 1-800-QUIT-NOW ((863) 250-6997) OR Espaol: 1-855-Djelo-Ya (4-132-440-1027) o para ms informacin haga clic aqu or Text READY to 253-664 to register via text  Jennifer Combs - following are the goals we discussed in your visit today:   Goals Addressed   None      Social Worker will follow up in 30 days.  [name], [name], MHA [facility]  Managed Medicaid Social Worker [phone]   Following is a copy of your plan of care:  There are no care plans that you recently modified to display for this patient.

## 2023-03-31 NOTE — Patient Outreach (Signed)
Medicaid Managed Care Social Work Note  03/31/2023 Name:  Jennifer Combs MRN:  [id number] DOB:  Sep 11, 1986  Jennifer Combs is an 37 y.o. year old female who is a primary patient of Georganna Skeans, MD.  The Medicaid Managed Care Coordination team was consulted for assistance with:  Community Resources   Ms. Breck was given information about Medicaid Managed Care Coordination team services today. [name] Patient agreed to services and verbal consent obtained.  Engaged with patient  for by telephone forfollow up visit in response to referral for case management and/or care coordination services.   Assessments/Interventions:  Review of past medical history, allergies, medications, health status, including review of consultants reports, laboratory and other test data, was performed as part of comprehensive evaluation and provision of chronic care management services.  SDOH: (Social Determinant of Health) assessments and interventions performed: SDOH Interventions    Flowsheet Row Patient Outreach Telephone from 03/30/2023 in [place] POPULATION HEALTH DEPARTMENT Patient Outreach Telephone from 03/29/2023 in [place] POPULATION HEALTH DEPARTMENT Patient Outreach Telephone from 02/18/2023 in [place] POPULATION HEALTH DEPARTMENT Telephone from 02/14/2023 in [place] POPULATION HEALTH DEPARTMENT Patient Outreach Telephone from 02/03/2023 in [place] POPULATION HEALTH DEPARTMENT  SDOH Interventions       Transportation Interventions -- Intervention Not Indicated -- -- Intervention Not Indicated  Alcohol Usage Interventions -- -- -- Intervention Not Indicated (Score <7) --  Financial Strain Interventions Other (Comment)  [BSW referral placed] -- -- -- --  Physical Activity Interventions -- -- Intervention Not Indicated -- --  Stress Interventions -- -- -- Intervention Not Indicated --  Social Connections Interventions -- -- Intervention Not Indicated -- --      BSW  completed a telephone outreach with patient, she was unable to finish school but only has 2 days left. She is behind on her rent and tried all resources BSW provided but they do not have any funds. BSW encouraged patient to try [facility].  Advanced Directives Status:  Not addressed in this encounter.  Care Plan                 Allergies  Allergen Reactions   Adhesive [Tape] Other (See Comments)    Adhesive from electrodes Leave burn marks on skin   Citrus    Tomato     MAKES MOUTH RAW.    Medications Reviewed Today     Reviewed by Danie Chandler, RN (Registered Nurse) on 03/30/23 at 1117  Med List Status: <None>   Medication Order Taking? Sig Documenting Provider Last Dose Status Informant           No Medications to Display                            Patient Active Problem List   Diagnosis Date Noted   Other stressful life events affecting family and household 02/28/2019   Mood disorder (HCC) 02/28/2019   Menorrhagia 12/26/2017   IDA (iron deficiency anemia) 05/19/2017   Epigastric pain 04/26/2017   Macrocytic anemia 04/26/2017   Immunization counseling 10/05/2012   Pregnancy affected by fetal growth restriction 10/02/2012   Sterilization consult 07/24/2012   Pregnancy 07/06/2012   Short interval between pregnancies complicating pregnancy, antepartum 07/06/2012   Hx of preterm delivery, currently pregnant 04/08/2011   Hx of postpartum depression, currently pregnant 04/08/2011    Conditions to be addressed/monitored per PCP order:   community resources  There are no care  plans that you recently modified to display for this patient.   Follow up:  Patient agrees to Care Plan and Follow-up.  Plan: The Managed Medicaid care management team will reach out to the patient again over the next 30 days.  Date/time of next scheduled Social Work care management/care coordination outreach:  [phone]  [name], [place], [place] [facility]  Managed [facility] Social  Worker [phone]

## 2023-04-25 ENCOUNTER — Other Ambulatory Visit (HOSPITAL_COMMUNITY)
Admission: RE | Admit: 2023-04-25 | Discharge: 2023-04-25 | Disposition: A | Discharge status: Home / Self Care | Payer: Medicaid Other | Source: Ambulatory Visit | Attending: Obstetrics & Gynecology | Admitting: Obstetrics & Gynecology

## 2023-04-25 ENCOUNTER — Ambulatory Visit (INDEPENDENT_AMBULATORY_CARE_PROVIDER_SITE_OTHER): Payer: Medicaid Other | Admitting: Obstetrics & Gynecology

## 2023-04-25 ENCOUNTER — Encounter (HOSPITAL_BASED_OUTPATIENT_CLINIC_OR_DEPARTMENT_OTHER): Payer: Self-pay | Admitting: Obstetrics & Gynecology

## 2023-04-25 VITALS — BP 108/82 | HR 51 | Ht 59.0 in | Wt 156.8 lb

## 2023-04-25 DIAGNOSIS — R1032 Left lower quadrant pain: Secondary | ICD-10-CM

## 2023-04-25 DIAGNOSIS — N898 Other specified noninflammatory disorders of vagina: Secondary | ICD-10-CM | POA: Insufficient documentation

## 2023-04-25 NOTE — Progress Notes (Unsigned)
GYNECOLOGY  VISIT  CC:   vaginal odor/abdominal pain  HPI: 37 y.o. [id number] Single Black or African American female here for concerns about vaginal odor and abdominal pian.  Pt underwent hysterectomy  in 2019.  She was lost to follow up.  Has experienced homelessness since that time and lost of stressors with her mother.  Has been in therapy as well.    Denies vaginal bleeding. Has been SA since I last saw her.  Having vaginal odor.    Has abdominal/pelvic pain.  Was advised this could be endometriosis but pt has no evidence of this at time of surgery and pathology did not show any concerns.  She is worried this pain is her left ovary although place she feels most pain is higher than ovary is typically located.    Has been seen by Dr. Rhea Belton and likely has IBS per his notes.  She di dhave colonoscopy in 2018 showing inflammation and H pylori infection.  Was treated for this.  She did have follow up testing 02/19/23 with negative results.    She's also had an iron infusion this year on 1/13 and then 1/20.  Most recently, iron levels and ferritin were normal and hb was 11.1 (labs from 02/18/2023).  She saw Dr. Al Pimple at that time.   Past Medical History:  Diagnosis Date   Allergy    Anemia    Anxiety    Depression    Ectopic pregnancy, tubal    GERD (gastroesophageal reflux disease)    H. pylori infection    IBS (irritable bowel syndrome)    Internal hemorrhoids    Ovarian cyst bil   PONV (postoperative nausea and vomiting)    Trichomonas infection     MEDS:   No current outpatient medications on file prior to visit.   No current facility-administered medications on file prior to visit.    ALLERGIES: Adhesive [tape], Citrus, and Tomato  SH:  single, smoking cigarettes and marijuana  Review of Systems  Constitutional: Negative.   Genitourinary:        LLQ pain    PHYSICAL EXAMINATION:    BP 108/82   Pulse (!) 51   Ht 4\' 11"  (1.499 m)   Wt 156 lb 12.8 oz (71.1 kg)   LMP  12/13/2017 (Exact Date)   BMI 31.67 kg/m     General appearance: alert, cooperative and appears stated age Abdomen: soft, mild tenderness to palpation in LLQ, no masses Lymph:  no inguinal LAD noted  Pelvic: External genitalia:  no lesions              Urethra:  normal appearing urethra with no masses, tenderness or lesions              Bartholins and Skenes: normal                 Vagina: normal appearing vagina with normal color and discharge, no lesions              Cervix: absent              Bimanual Exam:  Uterus:  uterus absent              Adnexa: no mass, fullness, tenderness             Chaperone, Raechel Ache, RN, was present for exam.  Assessment/Plan: 1. Vaginal odor - Cervicovaginal ancillary only( [place])  2. LLQ pain - pt will return for ultrasound.  Feel endometriosis  is unlikely - US PELVIS TRANSVAGINAL NON-OB (TV ONLY); Future

## 2023-04-26 ENCOUNTER — Encounter (HOSPITAL_BASED_OUTPATIENT_CLINIC_OR_DEPARTMENT_OTHER): Payer: Self-pay | Admitting: Obstetrics & Gynecology

## 2023-04-26 LAB — CERVICOVAGINAL ANCILLARY ONLY
Bacterial Vaginitis (gardnerella): POSITIVE — AB
Candida Glabrata: NEGATIVE
Candida Vaginitis: NEGATIVE
Chlamydia: NEGATIVE
Comment: NEGATIVE
Comment: NEGATIVE
Comment: NEGATIVE
Comment: NEGATIVE
Comment: NEGATIVE
Comment: NORMAL
Neisseria Gonorrhea: NEGATIVE
Trichomonas: NEGATIVE

## 2023-04-28 ENCOUNTER — Other Ambulatory Visit (HOSPITAL_BASED_OUTPATIENT_CLINIC_OR_DEPARTMENT_OTHER): Payer: Self-pay | Admitting: *Deleted

## 2023-04-28 MED ORDER — METRONIDAZOLE 0.75 % VA GEL: 1.0000 | Freq: Every day | VAGINAL | 0 refills | Status: AC

## 2023-04-28 NOTE — Progress Notes (Signed)
Rx sent to pharmacy for treatment of BV 

## 2023-05-02 ENCOUNTER — Other Ambulatory Visit: Payer: Medicaid Other

## 2023-05-02 NOTE — Patient Outreach (Signed)
  Medicaid Managed Care   Unsuccessful Outreach Note  05/02/2023 Name: Jennifer Combs MRN: [id number] DOB: Aug 10, 1986  Referred by: Georganna Skeans, MD Reason for referral : High Risk Managed Medicaid (MM Social work unsuccessful telephone outreach )   An unsuccessful telephone outreach was attempted today. The patient was referred to the case management team for assistance with care management and care coordination.   Follow Up Plan: The patient has been provided with contact information for the care management team and has been advised to call with any health related questions or concerns.   Abelino Derrick, MHA [facility]  Managed [facility] Social Worker [phone]

## 2023-05-02 NOTE — Patient Instructions (Signed)
  Medicaid Managed Care   Unsuccessful Outreach Note  05/02/2023 Name: Jennifer Combs MRN: [id number] DOB: [birth year]  Referred by: Georganna Skeans, MD Reason for referral : High Risk Managed Medicaid (MM Social work unsuccessful telephone outreach )   An unsuccessful telephone outreach was attempted today. The patient was referred to the case management team for assistance with care management and care coordination.   Follow Up Plan: The patient has been provided with contact information for the care management team and has been advised to call with any health related questions or concerns.   SIGNATURE

## 2023-05-18 ENCOUNTER — Other Ambulatory Visit: Payer: Medicaid Other | Admitting: Obstetrics and Gynecology

## 2023-05-18 ENCOUNTER — Encounter: Payer: Self-pay | Admitting: Obstetrics and Gynecology

## 2023-05-18 NOTE — Patient Outreach (Signed)
Medicaid Managed Care   Nurse Care Manager Note  05/18/2023 Name:  Jennifer Combs MRN:  [id number] DOB:  Apr 17, 1986  Jennifer Combs is an 37 y.o. year old female who is a primary patient of Jennifer Skeans, MD.  The [facility] Managed Care Coordination team was consulted for assistance with:    Chronic healthcare management needs, IBS, anemia, tobacco use, SDOH  Jennifer Combs was given information about Medicaid Managed Care Coordination team services today. Jennifer Combs Patient agreed to services and verbal consent obtained.  Engaged with patient by telephone for follow up visit in response to provider referral for case management and/or care coordination services.   Assessments/Interventions:  Review of past medical history, allergies, medications, health status, including review of consultants reports, laboratory and other test data, was performed as part of comprehensive evaluation and provision of chronic care management services.  SDOH (Social Determinants of Health) assessments and interventions performed: SDOH Interventions    Flowsheet Row Patient Outreach Telephone from 05/18/2023 in [place] POPULATION HEALTH DEPARTMENT Patient Outreach Telephone from 03/30/2023 in [place] POPULATION HEALTH DEPARTMENT Patient Outreach Telephone from 03/29/2023 in [place] POPULATION HEALTH DEPARTMENT Patient Outreach Telephone from 02/18/2023 in [place] POPULATION HEALTH DEPARTMENT Telephone from 02/14/2023 in [place] POPULATION HEALTH DEPARTMENT Patient Outreach Telephone from 02/03/2023 in  POPULATION HEALTH DEPARTMENT  SDOH Interventions        Transportation Interventions -- -- Intervention Not Indicated -- -- Intervention Not Indicated  Utilities Interventions Intervention Not Indicated -- -- -- -- --  Alcohol Usage Interventions -- -- -- -- Intervention Not Indicated (Score <7) --  Financial Strain Interventions -- Other (Comment)  [BSW referral placed] -- -- -- --   Physical Activity Interventions -- -- -- Intervention Not Indicated -- --  Stress Interventions -- -- -- -- Intervention Not Indicated --  Social Connections Interventions -- -- -- Intervention Not Indicated -- --  Health Literacy Interventions Intervention Not Indicated -- -- -- -- --     Care Plan  Allergies  Allergen Reactions   Adhesive [Tape] Other (See Comments)    Adhesive from electrodes Leave burn marks on skin   Citrus    Tomato     MAKES MOUTH RAW.   Medications Reviewed Today     Reviewed by Danie Chandler, RN (Registered Nurse) on 05/18/23 at 1447  Med List Status: <None>   Medication Order Taking? Sig Documenting Provider Last Dose Status Informant  metroNIDAZOLE (METROGEL) 0.75 % vaginal gel [id number]  Place 1 Applicatorful vaginally at bedtime. Use for 5 nights. Jennifer Bears, MD  Active            Patient Active Problem List   Diagnosis Date Noted   Other stressful life events affecting family and household 02/28/2019   Mood disorder (HCC) 02/28/2019   IDA (iron deficiency anemia) 05/19/2017   Epigastric pain 04/26/2017   Macrocytic anemia 04/26/2017   Genital herpes simplex 04/08/2011   Conditions to be addressed/monitored per PCP order:  Chronic healthcare management needs, IBS, anemia, tobacco use, SDOH  Care Plan : RN Care Manager Plan of Care  Updates made by Danie Chandler, RN since 05/18/2023 12:00 AM     Problem: Health Promotion or Disease Self-Management (General Plan of Care)      Long-Range Goal: Chronic Disease Management and SDOH Barriers   Start Date: 02/03/2023  Expected End Date: 06/30/2023  Priority: High  Note:   Current Barriers:  Knowledge Deficits related to plan  of care for management of macrocytic anemia, IDA, IBS  Care Coordination needs related to Financial constraints related to rent and utilities Chronic Disease Management support and education needs related to macrocytic anemia, IDA, IBS  Financial Constraints   05/18/23:  LLQ pain-has pelvic u/s scheduled for 8/21.  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  BSW appointment rescheduled for patient.  Looking for a house-currently in a 2 bedroom apartment.  RNCM Clinical Goal(s):  Patient will verbalize understanding of plan for management of macrocytic anemia, IDA, IBS as evidenced by patient report verbalize basic understanding of macrocytic anemia, IDA, IBS  disease process and self health management plan as evidenced by patient report take all medications exactly as prescribed and will call provider for medication related questions as evidenced by patient report demonstrate understanding of rationale for each prescribed medication as evidenced by patient  attend all scheduled medical appointments as evidenced by patient report and EMR review. demonstrate ongoing adherence to prescribed treatment plan for macrocytic anemia, IDA, IBS as evidenced by patient report  continue to work with RN Care Manager to address care management and care coordination needs related to  macrocytic anemia, IDA, IBS as evidenced by adherence to CM Team Scheduled appointments work with Child psychotherapist to address  related to the management of  resources for dental and rent related to the management of macrocytic anemia, IDA, IBS  as evidenced by review of EMR and patient or Child psychotherapist report through collaboration with Medical illustrator, provider, and care team.   Interventions: Inter-disciplinary care team collaboration (see longitudinal plan of care) Evaluation of current treatment plan related to  self management and patient's adherence to plan as established by provider BSW referral for rent and utility resources-completed BSW follow up scheduled     (Status:  New goal.)  Long Term Goal Evaluation of current treatment plan related to macrocytic anemia, IDA, IBS ,  self-management and patient's adherence to plan as established  by provider. Discussed plans with patient for ongoing care management follow up and provided patient with direct contact information for care management team Reviewed medications with patient  Reviewed scheduled/upcoming provider appointments Social Work referral for resources Discussed plans with patient for ongoing care management follow up and provided patient with direct contact information for care management team Assessed social determinant of health barriers BSW completed a telephone outreach with mom. She stated she is in school full time and does not work, she was only able to pay half of her rent this month. She does not have a car, and is behind on rent and utilities. She does receive foodstamps and receives food from her mom that stay in [facility]. BSW will email patient a list of resources for assistance.  SDOH Barriers (Status:  New goal.) Long Term Goal Patient interviewed and SDOH assessment performed        SDOH Interventions    Flowsheet Row Patient Outreach Telephone from 02/03/2023 in [place] POPULATION HEALTH DEPARTMENT  SDOH Interventions   Transportation Interventions Intervention Not Indicated      Patient interviewed and appropriate assessments performed Discussed plans with patient for ongoing care management follow up and provided patient with direct contact information for care management team  Patient Goals/Self-Care Activities: Take all medications as prescribed Attend all scheduled provider appointments Call pharmacy for medication refills 3-7 days in advance of running out of medications Perform all self care activities independently  Perform IADL's (shopping, preparing  meals, housekeeping, managing finances) independently Call provider office for new concerns or questions  Work with the social worker to address care coordination needs and will continue to work with the clinical team to address health care and disease management related  needs  Follow Up Plan:  The patient has been provided with contact information for the care management team and has been advised to call with any health related questions or concerns.  The care management team will reach out to the patient again over the next 45 business  days.   Long-Range Goal: Establish Plan of Care for Chronic Disease Management Needs   Priority: High  Note:   Timeframe:  Long-Range Goal Priority:  High Start Date:     02/03/23                        Expected End Date: ongoing                      Follow Up Date 06/21/23   - practice safe sex - schedule appointment for vaccines needed due to my age or health - schedule recommended health tests (blood work, mammogram, colonoscopy, pap test) - schedule and keep appointment for annual check-up    Why is this important?   Screening tests can find diseases early when they are easier to treat.  Your doctor or nurse will talk with you about which tests are important for you.  Getting shots for common diseases like the flu and shingles will help prevent them.  05/18/23:  saw GYN 7/8-pelvic u/s 8/21   Follow Up:  Patient agrees to Care Plan and Follow-up.  Plan: The Managed Medicaid care management team will reach out to the patient again over the next 30 business  days. and The  Patient has been provided with contact information for the Managed Medicaid care management team and has been advised to call with any health related questions or concerns.  Date/time of next scheduled RN care management/care coordination outreach: 06/21/23 at 245

## 2023-05-18 NOTE — Patient Instructions (Signed)
Hi Jennifer Combs, nice to speak with you this afternoon-thanks for updating me on everything!  Jennifer Combs was given information about Medicaid Managed Care team care coordination services as a part of their [facility] Medicaid benefit. Jennifer Combs verbally consented to engagement with the [facility] Managed Care team.   If you are experiencing a medical emergency, please call 911 or report to your local emergency department or urgent care.   If you have a non-emergency medical problem during routine business hours, please contact your provider's office and ask to speak with a nurse.   For questions related to your [facility] health plan, please call: [phone] or go here:[email or web address]  If you would like to schedule transportation through your [facility] plan, please call the following number at least 2 days in advance of your appointment: [phone].   You can also use the MTM portal or MTM mobile app to manage your rides. Reimbursement for transportation is available through [facility]! For the portal, please go to [email or web address]  Call the [facility] Crisis Line at [phone], at any time, 24 hours a day, 7 days a week. If you are in danger or need immediate medical attention call 911.  If you would like help to quit smoking, call 1-800-QUIT-NOW (985-267-2330) OR Espaol: 1-855-Djelo-Ya (4-132-440-1027) o para ms informacin haga clic aqu or Text READY to 253-664 to register via text  Jennifer Combs - following are the goals we discussed in your visit today:   Goals Addressed    Timeframe:  Long-Range Goal Priority:  High Start Date:     02/03/23                        Expected End Date: ongoing                      Follow Up Date 06/21/23   - practice safe sex - schedule appointment for vaccines needed due to my age or health - schedule recommended health tests (blood work, mammogram, colonoscopy, pap test) - schedule and keep appointment  for annual check-up    Why is this important?   Screening tests can find diseases early when they are easier to treat.  Your doctor or nurse will talk with you about which tests are important for you.  Getting shots for common diseases like the flu and shingles will help prevent them.  05/18/23:  saw GYN 7/8-pelvic u/s 8/21  Patient verbalizes understanding of instructions and care plan provided today and agrees to view in MyChart. Active MyChart status and patient understanding of how to access instructions and care plan via MyChart confirmed with patient.     The Managed Medicaid care management team will reach out to the patient again over the next 30 business  days.  The  Patient  has been provided with contact information for the Managed Medicaid care management team and has been advised to call with any health related questions or concerns.   Kathi Der RN, BSN Jennifer Combs  [facility] Care Management Coordinator - Managed Medicaid High Risk [phone]   Following is a copy of your plan of care:  Care Plan : RN Care Manager Plan of Care  Updates made by Danie Chandler, RN since 05/18/2023 12:00 AM     Problem: Health Promotion or Disease Self-Management (General Plan of Care)      Long-Range Goal: Chronic Disease Management and SDOH Barriers   Start Date: 02/03/2023  Expected End Date: 06/30/2023  Priority: High  Note:   Current Barriers:  Knowledge Deficits related to plan of care for management of macrocytic anemia, IDA, IBS  Care Coordination needs related to Financial constraints related to rent and utilities Chronic Disease Management support and education needs related to macrocytic anemia, IDA, IBS  Financial Constraints  05/18/23:  LLQ pain-has pelvic u/s scheduled for 8/21.  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  BSW appointment rescheduled for patient.  Looking for a house-currently in a 2 bedroom  apartment.  RNCM Clinical Goal(s):  Patient will verbalize understanding of plan for management of macrocytic anemia, IDA, IBS as evidenced by patient report verbalize basic understanding of macrocytic anemia, IDA, IBS  disease process and self health management plan as evidenced by patient report take all medications exactly as prescribed and will call provider for medication related questions as evidenced by patient report demonstrate understanding of rationale for each prescribed medication as evidenced by patient  attend all scheduled medical appointments as evidenced by patient report and EMR review. demonstrate ongoing adherence to prescribed treatment plan for macrocytic anemia, IDA, IBS as evidenced by patient report  continue to work with RN Care Manager to address care management and care coordination needs related to  macrocytic anemia, IDA, IBS as evidenced by adherence to CM Team Scheduled appointments work with Child psychotherapist to address  related to the management of  resources for dental and rent related to the management of macrocytic anemia, IDA, IBS  as evidenced by review of EMR and patient or Child psychotherapist report through collaboration with Medical illustrator, provider, and care team.   Interventions: Inter-disciplinary care team collaboration (see longitudinal plan of care) Evaluation of current treatment plan related to  self management and patient's adherence to plan as established by provider BSW referral for rent and utility resources-completed BSW follow up scheduled     (Status:  New goal.)  Long Term Goal Evaluation of current treatment plan related to macrocytic anemia, IDA, IBS ,  self-management and patient's adherence to plan as established by provider. Discussed plans with patient for ongoing care management follow up and provided patient with direct contact information for care management team Reviewed medications with patient  Reviewed scheduled/upcoming provider  appointments Social Work referral for resources Discussed plans with patient for ongoing care management follow up and provided patient with direct contact information for care management team Assessed social determinant of health barriers BSW completed a telephone outreach with mom. She stated she is in school full time and does not work, she was only able to pay half of her rent this month. She does not have a car, and is behind on rent and utilities. She does receive foodstamps and receives food from her mom that stay in [facility]. BSW will email patient a list of resources for assistance.  SDOH Barriers (Status:  New goal.) Long Term Goal Patient interviewed and SDOH assessment performed        SDOH Interventions    Flowsheet Row Patient Outreach Telephone from 02/03/2023 in [place] POPULATION HEALTH DEPARTMENT  SDOH Interventions   Transportation Interventions Intervention Not Indicated      Patient interviewed and appropriate assessments performed Discussed plans with patient for ongoing care management follow up and provided patient with direct contact information for care management team  Patient Goals/Self-Care Activities: Take all medications as prescribed Attend all scheduled provider appointments Call pharmacy for medication refills 3-7  days in advance of running out of medications Perform all self care activities independently  Perform IADL's (shopping, preparing meals, housekeeping, managing finances) independently Call provider office for new concerns or questions  Work with the social worker to address care coordination needs and will continue to work with the clinical team to address health care and disease management related needs  Follow Up Plan:  The patient has been provided with contact information for the care management team and has been advised to call with any health related questions or concerns.  The care management team will reach out to the patient  again over the next 45 business days.

## 2023-05-20 ENCOUNTER — Other Ambulatory Visit: Payer: Medicaid Other

## 2023-05-20 NOTE — Patient Instructions (Signed)
Visit Information  Ms. Ard was given information about Medicaid Managed Care team care coordination services as a part of their [facility] Medicaid benefit. [name] verbally consented to engagement with the [facility] Managed Care team.   If you are experiencing a medical emergency, please call 911 or report to your local emergency department or urgent care.   If you have a non-emergency medical problem during routine business hours, please contact your provider's office and ask to speak with a nurse.   For questions related to your [facility] health plan, please call: [phone] or go here:[email or web address]  If you would like to schedule transportation through your [facility] plan, please call the following number at least 2 days in advance of your appointment: [phone].   You can also use the MTM portal or MTM mobile app to manage your rides. Reimbursement for transportation is available through [facility]! For the portal, please go to [email or web address]  Call the [facility] Crisis Line at [phone], at any time, 24 hours a day, 7 days a week. If you are in danger or need immediate medical attention call 911.  If you would like help to quit smoking, call 1-800-QUIT-NOW ((863) 250-6997) OR Espaol: 1-855-Djelo-Ya (4-132-440-1027) o para ms informacin haga clic aqu or Text READY to 253-664 to register via text  [name] - following are the goals we discussed in your visit today:   Goals Addressed   None      Social Worker will follow up in 30 days.  [name], [name], MHA [facility]  Managed Medicaid Social Worker [phone]   Following is a copy of your plan of care:  There are no care plans that you recently modified to display for this patient.

## 2023-05-20 NOTE — Patient Outreach (Signed)
Medicaid Managed Care Social Work Note  05/20/2023 Name:  Jennifer Combs MRN:  [id number] DOB:  [birth year]  Jennifer Combs is an 37 y.o. year old female who is a primary patient of Georganna Skeans, MD.  The Medicaid Managed Care Coordination team was consulted for assistance with:  Community Resources   [name] was given information about Medicaid Managed Care Coordination team services today. [name] Patient agreed to services and verbal consent obtained.  Engaged with patient  for by telephone forfollow up visit in response to referral for case management and/or care coordination services.   Assessments/Interventions:  Review of past medical history, allergies, medications, health status, including review of consultants reports, laboratory and other test data, was performed as part of comprehensive evaluation and provision of chronic care management services.  SDOH: (Social Determinant of Health) assessments and interventions performed: SDOH Interventions    Flowsheet Row Patient Outreach Telephone from 05/18/2023 in [place] POPULATION HEALTH DEPARTMENT Patient Outreach Telephone from 03/30/2023 in [place] POPULATION HEALTH DEPARTMENT Patient Outreach Telephone from 03/29/2023 in [place] POPULATION HEALTH DEPARTMENT Patient Outreach Telephone from 02/18/2023 in [place] POPULATION HEALTH DEPARTMENT Telephone from 02/14/2023 in [place] POPULATION HEALTH DEPARTMENT Patient Outreach Telephone from 02/03/2023 in [place] POPULATION HEALTH DEPARTMENT  SDOH Interventions        Transportation Interventions -- -- Intervention Not Indicated -- -- Intervention Not Indicated  Utilities Interventions Intervention Not Indicated -- -- -- -- --  Alcohol Usage Interventions -- -- -- -- Intervention Not Indicated (Score <7) --  Financial Strain Interventions -- Other (Comment)  [BSW referral placed] -- -- -- --  Physical Activity Interventions -- -- -- Intervention Not  Indicated -- --  Stress Interventions -- -- -- -- Intervention Not Indicated --  Social Connections Interventions -- -- -- Intervention Not Indicated -- --  Health Literacy Interventions Intervention Not Indicated -- -- -- -- --     BSW completed a telephone outreach with patient. She has not been able to finish school yet. She has not received an eviction notice but is behind on her rent. She is trying to save money to move. BSW will email patient resources for food and rent.  Advanced Directives Status:  Not addressed in this encounter.  Care Plan                 Allergies  Allergen Reactions   Adhesive [Tape] Other (See Comments)    Adhesive from electrodes Leave burn marks on skin   Citrus    Tomato     MAKES MOUTH RAW.    Medications Reviewed Today   Medications were not reviewed in this encounter     Patient Active Problem List   Diagnosis Date Noted   Other stressful life events affecting family and household 02/28/2019   Mood disorder (HCC) 02/28/2019   IDA (iron deficiency anemia) 05/19/2017   Epigastric pain 04/26/2017   Macrocytic anemia 04/26/2017   Genital herpes simplex 04/08/2011    Conditions to be addressed/monitored per PCP order:   community resources  There are no care plans that you recently modified to display for this patient.   Follow up:  Patient agrees to Care Plan and Follow-up.  Plan: The Managed Medicaid care management team will reach out to the patient again over the next 30 days.  Date/time of next scheduled Social Work care management/care coordination outreach:  06/22/23 [name], [name], [facility]  Managed [facility] Social Worker [phone]

## 2023-06-08 ENCOUNTER — Encounter (HOSPITAL_BASED_OUTPATIENT_CLINIC_OR_DEPARTMENT_OTHER): Payer: Self-pay | Admitting: Obstetrics & Gynecology

## 2023-06-08 ENCOUNTER — Ambulatory Visit (HOSPITAL_BASED_OUTPATIENT_CLINIC_OR_DEPARTMENT_OTHER): Payer: Medicaid Other

## 2023-06-08 ENCOUNTER — Ambulatory Visit (INDEPENDENT_AMBULATORY_CARE_PROVIDER_SITE_OTHER): Payer: Medicaid Other | Admitting: Obstetrics & Gynecology

## 2023-06-08 ENCOUNTER — Other Ambulatory Visit (HOSPITAL_COMMUNITY)
Admission: RE | Admit: 2023-06-08 | Discharge: 2023-06-08 | Disposition: A | Discharge status: Home / Self Care | Payer: Medicaid Other | Source: Ambulatory Visit | Attending: Obstetrics & Gynecology | Admitting: Obstetrics & Gynecology

## 2023-06-08 ENCOUNTER — Encounter: Payer: Self-pay | Admitting: Hematology and Oncology

## 2023-06-08 ENCOUNTER — Other Ambulatory Visit (HOSPITAL_BASED_OUTPATIENT_CLINIC_OR_DEPARTMENT_OTHER): Payer: Self-pay

## 2023-06-08 VITALS — BP 122/81 | HR 79 | Ht 59.5 in | Wt 156.4 lb

## 2023-06-08 DIAGNOSIS — N61 Mastitis without abscess: Secondary | ICD-10-CM

## 2023-06-08 DIAGNOSIS — R1032 Left lower quadrant pain: Secondary | ICD-10-CM

## 2023-06-08 DIAGNOSIS — Z113 Encounter for screening for infections with a predominantly sexual mode of transmission: Secondary | ICD-10-CM | POA: Insufficient documentation

## 2023-06-08 DIAGNOSIS — L731 Pseudofolliculitis barbae: Secondary | ICD-10-CM

## 2023-06-08 DIAGNOSIS — N6002 Solitary cyst of left breast: Secondary | ICD-10-CM | POA: Diagnosis not present

## 2023-06-08 DIAGNOSIS — N6452 Nipple discharge: Secondary | ICD-10-CM | POA: Diagnosis not present

## 2023-06-08 MED ORDER — SULFAMETHOXAZOLE-TRIMETHOPRIM 800-160 MG PO TABS
1.0000 | ORAL_TABLET | Freq: Two times a day (BID) | ORAL | 0 refills | Status: AC
Start: 2023-06-08 — End: ?
  Filled 2023-06-08: qty 14, 7d supply, fill #0

## 2023-06-08 NOTE — Progress Notes (Unsigned)
GYNECOLOGY  VISIT  CC:   LLQ pain, follow up after ultrasound, drainage from nipple  HPI: 37 y.o. [id number] Single Black or African American female here for discuss ultrasound findings due to LLQ pain.  She underwent hysterectomy with bilateral salpingectomy in 2019.  Ovaries are normal in appearance and ultrasound is otherwise normal.    Pt reports she has been to GI as well for evaluation and she has sensitivity to meat.  Anytime she eats meat products but especially beef, she will have exacerbations of LLQ pain and will have looser stools in a relatively short time.  She says she knows she she needs to do from a dietary standpoint but she just doesn't do it all the time.  Unrelated, she is having a somewhat malodorous drainage from one opening of a nipple piercing on her left nipple.  She can reproduce this today.  Also, unrelated, she thinks she may have an ingrown hair that she would like me to evaluate.  It is tender.  No drainage.  Has been seeing a new partner and feels this has come up since that time.    She would like STD testing today as well.  Wants to be testing for GC/Chl/trich--just vaginal swab testing.     Past Medical History:  Diagnosis Date   Allergy    Anemia    Anxiety    Depression    Ectopic pregnancy, tubal    GERD (gastroesophageal reflux disease)    H. pylori infection    IBS (irritable bowel syndrome)    Internal hemorrhoids    Ovarian cyst bil   PONV (postoperative nausea and vomiting)    Trichomonas infection     MEDS:   Current Outpatient Medications on File Prior to Visit  Medication Sig Dispense Refill   metroNIDAZOLE (METROGEL) 0.75 % vaginal gel Place 1 Applicatorful vaginally at bedtime. Use for 5 nights. (Patient not taking: Reported on 06/08/2023) 70 g 0   No current facility-administered medications on file prior to visit.    ALLERGIES: Adhesive [tape], Citrus, and Tomato  SH:  single, non smoker  Review of Systems  Constitutional:  Negative.   Genitourinary: Negative.     PHYSICAL EXAMINATION:    BP 122/81 (BP Location: Left Arm, Patient Position: Sitting, Cuff Size: Normal)   Pulse 79   Ht 4' 11.5" (1.511 m) Comment: Reported  Wt 156 lb 6.4 oz (70.9 kg)   LMP 12/13/2017 (Exact Date)   BMI 31.06 kg/m     General appearance: alert, cooperative and appears stated age Breasts: right breast normal without masses, skin changes, LAD; left breast with cystic feeling lesion about 3cm beneath and lateral to the nipple that when it is compressed, there is cloudy looking drainage from the inferior opening of the prior nipple piercing (jewelry has been removed), small and mobile lymph node in axilla noted as well Abdomen: soft, non-tender; bowel sounds normal; no masses,  no organomegaly Lymph:  no inguinal LAD noted  Pelvic: External genitalia:  furuncle without fluctuance in left groin             Anus: non fluctuance furuncle lateral to left anus also noted  Assessment/Plan: 1. Nipple discharge - diagnostic imaging on left will be ordered - US LIMITED ULTRASOUND INCLUDING AXILLA LEFT BREAST ; Future - MM Digital Diagnostic Unilat L; Future  2. Breast cyst, left  3. Nipple infection - Culture obtained today - sulfamethoxazole-trimethoprim (BACTRIM DS) 800-160 MG tablet; Take 1 tablet by mouth 2 (two)  times daily.  Dispense: 14 tablet; Refill: 0   4. Ingrown hair - will treat with antibiotics due to tenderness - sulfamethoxazole-trimethoprim (BACTRIM DS) 800-160 MG tablet; Take 1 tablet by mouth 2 (two) times daily.  Dispense: 14 tablet; Refill: 0  5. Screen for STD (sexually transmitted disease) - Cervicovaginal ancillary only( [facility])  6. LLQ pain - pt aware u/s normal.

## 2023-06-09 LAB — CERVICOVAGINAL ANCILLARY ONLY
Chlamydia: NEGATIVE
Comment: NEGATIVE
Comment: NEGATIVE
Comment: NORMAL
Neisseria Gonorrhea: NEGATIVE
Trichomonas: NEGATIVE

## 2023-06-12 LAB — WOUND CULTURE: Organism ID, Bacteria: NONE SEEN

## 2023-06-21 ENCOUNTER — Encounter: Payer: Self-pay | Admitting: Obstetrics and Gynecology

## 2023-06-21 ENCOUNTER — Ambulatory Visit: Payer: Medicaid Other

## 2023-06-21 ENCOUNTER — Other Ambulatory Visit: Payer: Medicaid Other | Admitting: Obstetrics and Gynecology

## 2023-06-21 NOTE — Patient Outreach (Signed)
Care Coordination  06/21/2023  Jennifer Combs 1986/04/27 [id number]  RNCM called patient at scheduled time.  Patient answered phone and asked to be called back tomorrow.  Kathi Der RN, BSN [place]  [employer] - Managed Medicaid High Risk [phone]

## 2023-06-22 ENCOUNTER — Other Ambulatory Visit: Payer: Medicaid Other | Admitting: Obstetrics and Gynecology

## 2023-06-22 ENCOUNTER — Other Ambulatory Visit: Payer: Medicaid Other

## 2023-06-22 NOTE — Patient Outreach (Signed)
  Medicaid Managed Care   Unsuccessful Attempt Note   06/22/2023 Name: Jennifer Combs MRN: [id number] DOB: 1986-07-07  Referred by: Georganna Skeans, MD Reason for referral : High Risk Managed Medicaid (Unsuccessful telephone outreach)  A second unsuccessful telephone outreach was attempted today. The patient was referred to the case management team for assistance with care management and care coordination.    Follow Up Plan: The Managed Medicaid care management team will reach out to the patient again over the next 30 business  days. and The  Patient has been provided with contact information for the Managed Medicaid care management team and has been advised to call with any health related questions or concerns.   Kathi Der RN, BSN [place]  [employer] - Managed Medicaid High Risk [phone]

## 2023-06-22 NOTE — Patient Instructions (Signed)
Hi Jennifer Combs, I am sorry I missed you this afternoon, I was trying to call you back as requested yesterday at same time, I hope you are okay - as a part of your Medicaid benefit, you are eligible for care management and care coordination services at no cost or copay. I was unable to reach you by phone today but would be happy to help you with your health related needs. Please feel free to call me at [phone].   A member of the Managed Medicaid care management team will reach out to you again over the next 30 business days.   Kathi Der RN, BSN [place]  [employer] - Managed Medicaid High Risk [phone]

## 2023-06-22 NOTE — Patient Outreach (Signed)
  Medicaid Managed Care   Unsuccessful Outreach Note  06/22/2023 Name: THAIZ TROST MRN: [id number] DOB: Feb 18, 1986  Referred by: Georganna Skeans, MD Reason for referral : High Risk Managed Medicaid (MM social work unsuccessful telephone outreach )   An unsuccessful telephone outreach was attempted today. The patient was referred to the case management team for assistance with care management and care coordination.   Follow Up Plan: A HIPAA compliant phone message was left for the patient providing contact information and requesting a return call.   Abelino Derrick, MHA [facility]  Managed [facility] Social Worker [phone]

## 2023-06-22 NOTE — Patient Instructions (Signed)
  Medicaid Managed Care   Unsuccessful Outreach Note  06/22/2023 Name: Jennifer Combs MRN: [id number] DOB: Feb 18, 1986  Referred by: Georganna Skeans, MD Reason for referral : High Risk Managed Medicaid (MM social work unsuccessful telephone outreach )   An unsuccessful telephone outreach was attempted today. The patient was referred to the case management team for assistance with care management and care coordination.   Follow Up Plan: A HIPAA compliant phone message was left for the patient providing contact information and requesting a return call.   Abelino Derrick, MHA [facility]  Managed [facility] Social Worker [phone]

## 2023-06-27 ENCOUNTER — Encounter (HOSPITAL_BASED_OUTPATIENT_CLINIC_OR_DEPARTMENT_OTHER): Payer: Self-pay | Admitting: Obstetrics & Gynecology

## 2023-07-01 ENCOUNTER — Other Ambulatory Visit (HOSPITAL_BASED_OUTPATIENT_CLINIC_OR_DEPARTMENT_OTHER): Payer: Self-pay | Admitting: Obstetrics & Gynecology

## 2023-07-01 ENCOUNTER — Ambulatory Visit
Admission: RE | Admit: 2023-07-01 | Discharge: 2023-07-01 | Disposition: A | Discharge status: Home / Self Care | Payer: Medicaid Other | Source: Ambulatory Visit | Attending: Obstetrics & Gynecology | Admitting: Obstetrics & Gynecology

## 2023-07-01 DIAGNOSIS — N6452 Nipple discharge: Secondary | ICD-10-CM

## 2023-07-01 DIAGNOSIS — N61 Mastitis without abscess: Secondary | ICD-10-CM

## 2023-07-01 DIAGNOSIS — N6002 Solitary cyst of left breast: Secondary | ICD-10-CM

## 2023-07-05 ENCOUNTER — Other Ambulatory Visit: Payer: Medicaid Other | Admitting: Obstetrics and Gynecology

## 2023-07-05 NOTE — Patient Outreach (Cosign Needed)
Medicaid Managed Care   Unsuccessful Attempt Note   07/05/2023 Name: Jennifer Combs MRN: [id number] DOB: 1986-05-11  Referred by: Georganna Skeans, MD Reason for referral : High Risk Managed Medicaid (Unsuccessful telephone outreach)  Third unsuccessful telephone outreach was attempted today. The patient was referred to the case management team for assistance with care management and care coordination. The patient's primary care provider has been notified of our unsuccessful attempts to make or maintain contact with the patient. The care management team is pleased to engage with this patient at any time in the future should he/she be interested in assistance from the care management team.    Follow Up Plan: The  Patient has been provided with contact information for the Managed Medicaid care management team and has been advised to call with any health related questions or concerns. and The Managed Medicaid care management team is available to follow up with the patient after provider conversation with the patient regarding recommendation for care management engagement and subsequent re-referral to the care management team.    Kathi Der RN, BSN [place]  [facility] Care Management Coordinator - Managed [place] High Risk [phone]

## 2023-07-05 NOTE — Patient Instructions (Signed)
Hi Ms. Riley. I am sorry to miss you today-I hope you are okay - as a part of your Medicaid benefit, you are eligible for care management and care coordination services at no cost or copay. I was unable to reach you by phone today but would be happy to help you with your health related needs. Please feel free to call me at [phone]  Kathi Der RN, BSN [place]  [facility] Care Management Coordinator - Managed [place] High Risk [phone]

## 2023-07-06 ENCOUNTER — Other Ambulatory Visit: Payer: Medicaid Other

## 2023-07-06 NOTE — Patient Outreach (Signed)
Medicaid Managed Care   Unsuccessful Outreach Note  07/06/2023 Name: Jennifer Combs MRN: [id number] DOB: [birth year]  Referred by: Georganna Skeans, MD Reason for referral : High Risk Managed Medicaid (MM social work unsuccessful telephone outreach )   An unsuccessful telephone outreach was attempted today. The patient was referred to the case management team for assistance with care management and care coordination.   Follow Up Plan: The patient has been provided with contact information for the care management team and has been advised to call with any health related questions or concerns.   Abelino Derrick, MHA [facility]  Managed [facility] Social Worker [phone]

## 2023-07-06 NOTE — Patient Instructions (Signed)
Medicaid Managed Care   Unsuccessful Outreach Note  07/06/2023 Name: Jennifer Combs MRN: [id number] DOB: [birth year]  Referred by: Georganna Skeans, MD Reason for referral : High Risk Managed Medicaid (MM social work unsuccessful telephone outreach )   An unsuccessful telephone outreach was attempted today. The patient was referred to the case management team for assistance with care management and care coordination.   Follow Up Plan: The patient has been provided with contact information for the care management team and has been advised to call with any health related questions or concerns.   Abelino Derrick, MHA [facility]  Managed [facility] Social Worker [phone]

## 2023-07-25 ENCOUNTER — Telehealth (HOSPITAL_BASED_OUTPATIENT_CLINIC_OR_DEPARTMENT_OTHER): Payer: Self-pay | Admitting: *Deleted

## 2023-07-25 ENCOUNTER — Other Ambulatory Visit (HOSPITAL_BASED_OUTPATIENT_CLINIC_OR_DEPARTMENT_OTHER): Payer: Self-pay | Admitting: Obstetrics & Gynecology

## 2023-07-25 DIAGNOSIS — N6452 Nipple discharge: Secondary | ICD-10-CM

## 2023-07-25 NOTE — Telephone Encounter (Signed)
Called pt to let her know that referral to general surgery has been placed. Advised to call if she has not heard from anyone for scheduling within a week. Pt verbalized understanding.

## 2023-08-16 ENCOUNTER — Other Ambulatory Visit: Payer: Self-pay | Admitting: General Surgery

## 2023-08-16 DIAGNOSIS — T8189XA Other complications of procedures, not elsewhere classified, initial encounter: Secondary | ICD-10-CM

## 2023-08-16 DIAGNOSIS — Z803 Family history of malignant neoplasm of breast: Secondary | ICD-10-CM

## 2023-08-16 DIAGNOSIS — N644 Mastodynia: Secondary | ICD-10-CM

## 2023-08-16 DIAGNOSIS — N6452 Nipple discharge: Secondary | ICD-10-CM

## 2023-08-23 ENCOUNTER — Inpatient Hospital Stay: Payer: Medicaid Other | Attending: Hematology and Oncology

## 2023-08-23 ENCOUNTER — Inpatient Hospital Stay: Payer: Medicaid Other | Admitting: Hematology and Oncology

## 2023-09-28 ENCOUNTER — Encounter: Payer: Self-pay | Admitting: Hematology and Oncology

## 2023-10-04 ENCOUNTER — Ambulatory Visit
Admission: RE | Admit: 2023-10-04 | Discharge: 2023-10-04 | Disposition: A | Discharge status: Home / Self Care | Payer: Medicaid Other | Source: Ambulatory Visit | Attending: General Surgery | Admitting: General Surgery

## 2023-10-04 DIAGNOSIS — N644 Mastodynia: Secondary | ICD-10-CM

## 2023-10-04 DIAGNOSIS — T8189XA Other complications of procedures, not elsewhere classified, initial encounter: Secondary | ICD-10-CM

## 2023-10-04 DIAGNOSIS — Z803 Family history of malignant neoplasm of breast: Secondary | ICD-10-CM

## 2023-10-04 DIAGNOSIS — N6452 Nipple discharge: Secondary | ICD-10-CM

## 2023-10-04 MED ORDER — GADOPICLENOL 0.5 MMOL/ML IV SOLN
7.0000 mL | Freq: Once | INTRAVENOUS | Status: AC | PRN
  Administered 2023-10-04: 7 mL via INTRAVENOUS

## 2023-12-29 ENCOUNTER — Encounter (HOSPITAL_BASED_OUTPATIENT_CLINIC_OR_DEPARTMENT_OTHER): Payer: Self-pay | Admitting: Obstetrics & Gynecology

## 2024-09-11 ENCOUNTER — Encounter (HOSPITAL_BASED_OUTPATIENT_CLINIC_OR_DEPARTMENT_OTHER): Payer: Self-pay | Admitting: Obstetrics & Gynecology
